# Patient Record
Sex: Female | Born: 1990 | Race: Black or African American | Hispanic: No | Marital: Single | Smoking: Never smoker
Health system: Southern US, Community
[De-identification: ages and names within clinical notes are randomized; demographics above are authoritative.]

## PROBLEM LIST (undated history)

## (undated) ENCOUNTER — Inpatient Hospital Stay (HOSPITAL_COMMUNITY): Payer: Self-pay

## (undated) DIAGNOSIS — A599 Trichomoniasis, unspecified: Secondary | ICD-10-CM

## (undated) DIAGNOSIS — B379 Candidiasis, unspecified: Secondary | ICD-10-CM

## (undated) DIAGNOSIS — A499 Bacterial infection, unspecified: Secondary | ICD-10-CM

## (undated) DIAGNOSIS — J45909 Unspecified asthma, uncomplicated: Secondary | ICD-10-CM

## (undated) DIAGNOSIS — R87629 Unspecified abnormal cytological findings in specimens from vagina: Secondary | ICD-10-CM

## (undated) DIAGNOSIS — A749 Chlamydial infection, unspecified: Secondary | ICD-10-CM

## (undated) DIAGNOSIS — F419 Anxiety disorder, unspecified: Secondary | ICD-10-CM

## (undated) DIAGNOSIS — I10 Essential (primary) hypertension: Secondary | ICD-10-CM

## (undated) DIAGNOSIS — Z87898 Personal history of other specified conditions: Secondary | ICD-10-CM

## (undated) DIAGNOSIS — N390 Urinary tract infection, site not specified: Secondary | ICD-10-CM

## (undated) DIAGNOSIS — O139 Gestational [pregnancy-induced] hypertension without significant proteinuria, unspecified trimester: Secondary | ICD-10-CM

## (undated) DIAGNOSIS — B977 Papillomavirus as the cause of diseases classified elsewhere: Secondary | ICD-10-CM

## (undated) DIAGNOSIS — Z8619 Personal history of other infectious and parasitic diseases: Secondary | ICD-10-CM

## (undated) DIAGNOSIS — Z8759 Personal history of other complications of pregnancy, childbirth and the puerperium: Secondary | ICD-10-CM

## (undated) DIAGNOSIS — F32A Depression, unspecified: Secondary | ICD-10-CM

## (undated) HISTORY — DX: Depression, unspecified: F32.A

## (undated) HISTORY — DX: Personal history of other complications of pregnancy, childbirth and the puerperium: Z87.59

## (undated) HISTORY — DX: Urinary tract infection, site not specified: N39.0

## (undated) HISTORY — DX: Candidiasis, unspecified: B37.9

## (undated) HISTORY — DX: Unspecified asthma, uncomplicated: J45.909

## (undated) HISTORY — DX: Trichomoniasis, unspecified: A59.9

## (undated) HISTORY — DX: Unspecified abnormal cytological findings in specimens from vagina: R87.629

## (undated) HISTORY — DX: Papillomavirus as the cause of diseases classified elsewhere: B97.7

## (undated) HISTORY — DX: Personal history of other specified conditions: Z87.898

## (undated) HISTORY — DX: Bacterial infection, unspecified: A49.9

## (undated) HISTORY — DX: Personal history of other infectious and parasitic diseases: Z86.19

## (undated) HISTORY — DX: Chlamydial infection, unspecified: A74.9

## (undated) HISTORY — DX: Essential (primary) hypertension: I10

## (undated) HISTORY — DX: Gestational (pregnancy-induced) hypertension without significant proteinuria, unspecified trimester: O13.9

## (undated) HISTORY — PX: DILATION AND CURETTAGE OF UTERUS: SHX78

## (undated) HISTORY — DX: Anxiety disorder, unspecified: F41.9

---
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
## 2010-01-13 ENCOUNTER — Inpatient Hospital Stay (HOSPITAL_COMMUNITY)
Admission: AD | Admit: 2010-01-13 | Discharge: 2010-01-13 | Payer: Self-pay | Source: Home / Self Care | Admitting: Obstetrics and Gynecology

## 2010-01-13 ENCOUNTER — Ambulatory Visit: Payer: Self-pay | Admitting: Physician Assistant

## 2010-01-20 ENCOUNTER — Inpatient Hospital Stay (HOSPITAL_COMMUNITY): Admission: AD | Admit: 2010-01-20 | Discharge: 2010-01-21 | Payer: Self-pay | Admitting: Obstetrics and Gynecology

## 2010-01-20 ENCOUNTER — Ambulatory Visit: Payer: Self-pay | Admitting: Physician Assistant

## 2010-01-25 ENCOUNTER — Ambulatory Visit (HOSPITAL_COMMUNITY): Admission: RE | Admit: 2010-01-25 | Discharge: 2010-01-25 | Payer: Self-pay | Admitting: Obstetrics and Gynecology

## 2010-07-30 NOTE — L&D Delivery Note (Signed)
Delivery Note  Pt was complete and had urge to push, moved to [facility] and pushed well, FHR decel to 70's with good recovery after ctx, tilted to R side and continued pushing well, FHR decels to 60's as infant was crowning. Overall reassuring.   At 3:49 PM a viable female was delivered via Vaginal, Spontaneous Delivery (Presentation: Right Occiput Anterior).  Cord was looped around shoulders,  APGAR: 8, 9; weight .  Unavailable at this time Placenta status: Intact, Spontaneous.  Sent to L&D, routine cord blood collected, Cord: 3 vessels with the following complications: None.    Anesthesia: Epidural  Episiotomy: None Lacerations: None Suture Repair: n/a Est. Blood Loss (mL):   Mom to postpartum.  Baby to nursery-stable. Skin-skin Mom plans BF and bottle and OP circ   Kristin Lyons M 07/28/2011, 4:07 PM

## 2010-10-15 LAB — URINE MICROSCOPIC-ADD ON

## 2010-10-15 LAB — URINALYSIS, ROUTINE W REFLEX MICROSCOPIC
Bilirubin Urine: NEGATIVE
Bilirubin Urine: NEGATIVE
Glucose, UA: NEGATIVE mg/dL
Glucose, UA: NEGATIVE mg/dL
Hgb urine dipstick: NEGATIVE
Ketones, ur: 15 mg/dL — AB
Ketones, ur: NEGATIVE mg/dL
Nitrite: NEGATIVE
Protein, ur: NEGATIVE mg/dL
Protein, ur: NEGATIVE mg/dL
Specific Gravity, Urine: 1.02 (ref 1.005–1.030)
Urobilinogen, UA: 0.2 mg/dL (ref 0.0–1.0)
pH: 6 (ref 5.0–8.0)
pH: 6.5 (ref 5.0–8.0)

## 2010-10-15 LAB — CBC
HCT: 39.2 % (ref 36.0–46.0)
Hemoglobin: 13.3 g/dL (ref 12.0–15.0)
MCHC: 34 g/dL (ref 30.0–36.0)
RBC: 4.32 MIL/uL (ref 3.87–5.11)
WBC: 5.6 10*3/uL (ref 4.0–10.5)

## 2010-10-15 LAB — WET PREP, GENITAL
Clue Cells Wet Prep HPF POC: NONE SEEN
Trich, Wet Prep: NONE SEEN
Yeast Wet Prep HPF POC: NONE SEEN

## 2010-10-15 LAB — GC/CHLAMYDIA PROBE AMP, GENITAL
Chlamydia, DNA Probe: NEGATIVE
GC Probe Amp, Genital: NEGATIVE

## 2010-10-15 LAB — POCT PREGNANCY, URINE: Preg Test, Ur: POSITIVE

## 2010-10-15 LAB — ABO/RH: ABO/RH(D): O POS

## 2011-06-08 LAB — STREP B DNA PROBE: GBS: POSITIVE

## 2011-06-14 ENCOUNTER — Inpatient Hospital Stay (HOSPITAL_COMMUNITY)
Admission: AD | Admit: 2011-06-14 | Discharge: 2011-06-16 | DRG: 778 | Disposition: A | Discharge status: Home / Self Care | Payer: Medicaid Other | Source: Ambulatory Visit | Attending: Obstetrics and Gynecology | Admitting: Obstetrics and Gynecology

## 2011-06-14 ENCOUNTER — Inpatient Hospital Stay (HOSPITAL_COMMUNITY): Payer: Medicaid Other

## 2011-06-14 ENCOUNTER — Encounter (HOSPITAL_COMMUNITY): Payer: Self-pay

## 2011-06-14 DIAGNOSIS — Z22330 Carrier of Group B streptococcus: Secondary | ICD-10-CM

## 2011-06-14 DIAGNOSIS — O99891 Other specified diseases and conditions complicating pregnancy: Secondary | ICD-10-CM | POA: Diagnosis present

## 2011-06-14 DIAGNOSIS — O99820 Streptococcus B carrier state complicating pregnancy: Secondary | ICD-10-CM

## 2011-06-14 DIAGNOSIS — O4700 False labor before 37 completed weeks of gestation, unspecified trimester: Principal | ICD-10-CM | POA: Diagnosis present

## 2011-06-14 LAB — COMPREHENSIVE METABOLIC PANEL
Albumin: 2.8 g/dL — ABNORMAL LOW (ref 3.5–5.2)
BUN: 4 mg/dL — ABNORMAL LOW (ref 6–23)
Calcium: 9.3 mg/dL (ref 8.4–10.5)
Chloride: 100 mEq/L (ref 96–112)
Creatinine, Ser: 0.46 mg/dL — ABNORMAL LOW (ref 0.50–1.10)
GFR calc non Af Amer: >90 mL/min (ref >90)
Total Bilirubin: 0.1 mg/dL — ABNORMAL LOW (ref 0.3–1.2)

## 2011-06-14 LAB — URIC ACID: Uric Acid, Serum: 4.4 mg/dL (ref 2.4–7.0)

## 2011-06-14 LAB — LACTATE DEHYDROGENASE: LDH: 133 U/L (ref 94–250)

## 2011-06-14 LAB — URINALYSIS, ROUTINE W REFLEX MICROSCOPIC
Bilirubin Urine: NEGATIVE
Glucose, UA: NEGATIVE mg/dL
Hgb urine dipstick: NEGATIVE
Specific Gravity, Urine: 1.01 (ref 1.005–1.030)
Urobilinogen, UA: 0.2 mg/dL (ref 0.0–1.0)
pH: 6.5 (ref 5.0–8.0)

## 2011-06-14 LAB — CBC
HCT: 32.9 % — ABNORMAL LOW (ref 36.0–46.0)
Hemoglobin: 11 g/dL — ABNORMAL LOW (ref 12.0–15.0)
MCV: 85.5 fL (ref 78.0–100.0)
RBC: 3.85 MIL/uL — ABNORMAL LOW (ref 3.87–5.11)
RDW: 13.3 % (ref 11.5–15.5)
WBC: 8.4 10*3/uL (ref 4.0–10.5)

## 2011-06-14 LAB — URINE MICROSCOPIC-ADD ON

## 2011-06-14 MED ORDER — ACETAMINOPHEN 325 MG PO TABS
650.0000 mg | ORAL_TABLET | ORAL | Status: DC | PRN
  Administered 2011-06-16: 650 mg via ORAL
  Filled 2011-06-14: qty 2

## 2011-06-14 MED ORDER — ZOLPIDEM TARTRATE 10 MG PO TABS: 10.0000 mg | ORAL_TABLET | Freq: Every evening | ORAL | Status: DC | PRN

## 2011-06-14 MED ORDER — BETAMETHASONE SOD PHOS & ACET 6 (3-3) MG/ML IJ SUSP
12.0000 mg | Freq: Once | INTRAMUSCULAR | Status: AC
  Administered 2011-06-14: 12 mg via INTRAMUSCULAR
  Filled 2011-06-14: qty 2

## 2011-06-14 MED ORDER — LACTATED RINGERS IV BOLUS (SEPSIS)
500.0000 mL | Freq: Once | INTRAVENOUS | Status: AC
  Administered 2011-06-14: 1000 mL via INTRAVENOUS

## 2011-06-14 MED ORDER — CALCIUM CARBONATE ANTACID 500 MG PO CHEW: 2.0000 | CHEWABLE_TABLET | ORAL | Status: DC | PRN

## 2011-06-14 MED ORDER — BETAMETHASONE SOD PHOS & ACET 6 (3-3) MG/ML IJ SUSP
12.0000 mg | Freq: Once | INTRAMUSCULAR | Status: AC
  Administered 2011-06-15: 12 mg via INTRAMUSCULAR
  Filled 2011-06-14: qty 2

## 2011-06-14 MED ORDER — LACTATED RINGERS IV SOLN
INTRAVENOUS | Status: DC
  Administered 2011-06-14 – 2011-06-16 (×4): via INTRAVENOUS

## 2011-06-14 MED ORDER — NIFEDIPINE 10 MG PO CAPS
10.0000 mg | ORAL_CAPSULE | Freq: Four times a day (QID) | ORAL | Status: DC
  Administered 2011-06-14 – 2011-06-16 (×7): 10 mg via ORAL
  Filled 2011-06-14 (×7): qty 1

## 2011-06-14 MED ORDER — PRENATAL PLUS 27-1 MG PO TABS
1.0000 | ORAL_TABLET | Freq: Every day | ORAL | Status: DC
  Filled 2011-06-14: qty 1

## 2011-06-14 NOTE — Progress Notes (Signed)
Pt states send from MD office for PTL eval, found to be having ctx's in office, cervix slightly dilated per MD. Pt denies pain or bleeding. +FM, has clear vaginal d/c.

## 2011-06-14 NOTE — H&P (Signed)
Kristin Lyons is a 20 y.o. female, G2P0010, at 31 weeks, presenting for admission due to cervix 2 cm and contractions q 13-15 minutes in MAU.    Pregnancy remarkable for: Late to care at 19 weeks Positive GBS on urine culture Hx 2nd trimester SAB at 15 weeks, with D&C Hx asthma  Maternal Medical History:  Reason for admission: Reason for admission: contractions.  Contractions: Frequency: irregular.   Perceived severity is mild.    Fetal activity: Perceived fetal activity is normal.   Last perceived fetal movement was within the past hour.    Prenatal complications: Preterm labor (Cervix 2 cm on exam today).   Prenatal Complications - Diabetes: none.   HPI:  Entered care on 8/9 at 19 weeks, with [place] sone for dating--EDC 08/02/11 by [place], anterior placenta, normal cervical length and anatomy.  Had missed previously scheduled NOB work-up on 7/2 due to insurance issues.  Presented for regular visit today, with c/o increased mucusy discharge.  VE done, showing cervix 2 cm, but no FFN or cultures done before exam.  OB History    Grav Para Term Preterm Abortions TAB SAB Ect Mult Living   2    1  1        #1--6/11.  15 week SAB, with D&C #2--Current  Past Medical History  Diagnosis Date  . Asthma    Past Surgical History  Procedure Date  . No past surgeries   Hx D&C 6/11 for 15 week SAB  Family History: family history is not on file.PU and MU hypertension;  Father drug and etoh use Social History:  reports that she has never smoked. She does not have any smokeless tobacco history on file. She reports that she does not drink alcohol or use illicit drugs.  Single, FOB involved but not currently present with her [facility]).. 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  FOB has some college, is currently unemployed.    Review of Systems  Constitutional: Negative.   HENT: Negative.   Eyes: Negative.   Respiratory: Negative.   Cardiovascular:  Negative.   Gastrointestinal: Negative.   Genitourinary: Negative.   Musculoskeletal: Negative.   Skin: Negative.   Neurological: Negative.   Psychiatric/Behavioral: Negative.       Blood pressure 139/66, pulse 85, temperature 98.6 F (37 C), temperature source Oral, resp. rate 18, height 5\' 2"  (1.575 m), weight 73.029 kg (161 lb). Maternal Exam:  Uterine Assessment: Contraction strength is mild.  Contraction frequency is irregular.   Abdomen: Patient reports no abdominal tenderness. Fundal height is 33 cm.   Fetal presentation: vertex  Introitus: Normal vulva. Normal vagina.  Ferning test: not done.  Nitrazine test: not done. Amniotic fluid character: not assessed.  Cervix: not evaluated.   Physical Exam  Constitutional: She is oriented to person, place, and time. She appears well-developed.  HENT:  Head: Normocephalic.  Eyes: Pupils are equal, round, and reactive to light.  Neck: Normal range of motion.  Cardiovascular: Normal rate and regular rhythm.   Respiratory: Effort normal and breath sounds normal.  GI: Soft. Bowel sounds are normal.  Genitourinary: Uterus normal.  Musculoskeletal: Normal range of motion.  Neurological: She is alert and oriented to person, place, and time.  Skin: Skin is warm and dry.  Psychiatric: She has a normal mood and affect. Her behavior is normal. Judgment and thought content normal.   Cervix was 2 cm at office visit (approx 4pm).  No FFN or cultures done  at that time. UCs q 13-15 minutes, mild--patient now aware that what she has been feeling are contractions (was not aware before evaluation today.) FHR reactive, no decels.  Prenatal labs: ABO, Rh:  O+ Antibody:  Neg Rubella:  Immune RPR:   NR HBsAg:   Neg HIV:  Neg  GBS:   Positive urine culture Quad screen WNL Glucola WNL Hgb12.3 at NOB  Assessment/Plan: IUP at 33 weeks Preterm labor, with cervical dilation Positive GBS Late to care Hx asthma  Plan: Admitted to  Antenatal per consult with Dr. Stefano Gaul Routine antenatal orders Obtain FFN, GBS, GC, Chlamydia, wet prep at 4 pm tomorrow Continuous EFM. Betamethasone x 2 doses Procardia 10 mg po q 6h CBC, CMP UA, with culture OB US for growth ([place] will do tonight Plan of care reviewed with patient--she is agreeable with plan.  Nigel Bridgeman 06/14/2011, 8:03 PM

## 2011-06-15 LAB — RPR: RPR: NONREACTIVE

## 2011-06-15 LAB — WET PREP, GENITAL
Clue Cells Wet Prep HPF POC: NONE SEEN
Trich, Wet Prep: NONE SEEN
Yeast Wet Prep HPF POC: NONE SEEN

## 2011-06-15 LAB — ANTIBODY SCREEN: Antibody Screen: NEGATIVE

## 2011-06-15 NOTE — Progress Notes (Signed)
Pt without complaints.  No leakage of fluid or VB.  Good FM  BP 120/65  Pulse 95  Temp(Src) 98.3 F (36.8 C) (Oral)  Resp 20  Ht 5\' 2"  (1.575 m)  Wt 73.029 kg (161 lb)  BMI 29.45 kg/m2  FHTS Baseline: 140-150 bpm,mod varibility and accels  Toco irregular, every 30 minutes  Pt in NAD CV RRR Lungs CTAB abd  Gravid soft and NT GU no vb EXt no calf tenderness Results for orders placed during the hospital encounter of 06/14/11 (from the past 72 hour(s))  HIV ANTIBODY (ROUTINE TESTING)     Status: Normal      Component Value Range Comment   HIV Non-reactive     RUBELLA ANTIBODY, IGM     Status: Normal      Component Value Range Comment   Rubella Immune     HEPATITIS B SURFACE ANTIGEN     Status: Normal      Component Value Range Comment   Hepatitis B Surface Ag Negative     RPR     Status: Normal      Component Value Range Comment   RPR Nonreactive     URINALYSIS, ROUTINE W REFLEX MICROSCOPIC     Status: Abnormal   Collection Time   06/14/11  6:29 PM      Component Value Range Comment   Color, Urine YELLOW  YELLOW     Appearance CLEAR  CLEAR     Specific Gravity, Urine 1.010  1.005 - 1.030     pH 6.5  5.0 - 8.0     Glucose, UA NEGATIVE  NEGATIVE (mg/dL)    Hgb urine dipstick NEGATIVE  NEGATIVE     Bilirubin Urine NEGATIVE  NEGATIVE     Ketones, ur 15 (*) NEGATIVE (mg/dL)    Protein, ur NEGATIVE  NEGATIVE (mg/dL)    Urobilinogen, UA 0.2  0.0 - 1.0 (mg/dL)    Nitrite NEGATIVE  NEGATIVE     Leukocytes, UA MODERATE (*) NEGATIVE    URINE MICROSCOPIC-ADD ON     Status: Abnormal   Collection Time   06/14/11  6:29 PM      Component Value Range Comment   Squamous Epithelial / LPF FEW (*) RARE     WBC, UA 7-10  <3 (WBC/hpf)    Bacteria, UA FEW (*) RARE     Urine-Other MUCOUS PRESENT     CBC     Status: Abnormal   Collection Time   06/14/11  8:05 PM      Component Value Range Comment   WBC 8.4  4.0 - 10.5 (K/uL)    RBC 3.85 (*) 3.87 - 5.11 (MIL/uL)    Hemoglobin 11.0  (*) 12.0 - 15.0 (g/dL)    HCT 21.3 (*) 08.6 - 46.0 (%)    MCV 85.5  78.0 - 100.0 (fL)    MCH 28.6  26.0 - 34.0 (pg)    MCHC 33.4  30.0 - 36.0 (g/dL)    RDW 57.8  46.9 - 62.9 (%)    Platelets 173  150 - 400 (K/uL)   COMPREHENSIVE METABOLIC PANEL     Status: Abnormal   Collection Time   06/14/11  8:05 PM      Component Value Range Comment   Sodium 134 (*) 135 - 145 (mEq/L)    Potassium 3.4 (*) 3.5 - 5.1 (mEq/L)    Chloride 100  96 - 112 (mEq/L)    CO2 23  19 - 32 (mEq/L)  Glucose, Bld 84  70 - 99 (mg/dL)    BUN 4 (*) 6 - 23 (mg/dL)    Creatinine, Ser 1.61 (*) 0.50 - 1.10 (mg/dL)    Calcium 9.3  8.4 - 10.5 (mg/dL)    Total Protein 6.0  6.0 - 8.3 (g/dL)    Albumin 2.8 (*) 3.5 - 5.2 (g/dL)    AST 16  0 - 37 (U/L)    ALT 11  0 - 35 (U/L)    Alkaline Phosphatase 164 (*) 39 - 117 (U/L)    Total Bilirubin 0.1 (*) 0.3 - 1.2 (mg/dL)    GFR calc non Af Amer >90  >90 (mL/min)    GFR calc Af Amer >90  >90 (mL/min)   LACTATE DEHYDROGENASE     Status: Normal   Collection Time   06/14/11  8:05 PM      Component Value Range Comment   LD 133  94 - 250 (U/L)   URIC ACID     Status: Normal   Collection Time   06/14/11  8:05 PM      Component Value Range Comment   Uric Acid, Serum 4.4  2.4 - 7.0 (mg/dL)     Assessment and Plan [redacted]w[redacted]d  ptl Wet prep ,gc and chlam Beta methasone Procardia prn  Monitor closely

## 2011-06-15 NOTE — Progress Notes (Signed)
UR Chart review completed.  

## 2011-06-16 LAB — URINE CULTURE
Culture  Setup Time: 201211161015
Special Requests: NORMAL

## 2011-06-16 LAB — GC/CHLAMYDIA PROBE AMP, URINE
Chlamydia, Swab/Urine, PCR: NEGATIVE
GC Probe Amp, Urine: NEGATIVE

## 2011-06-16 MED ORDER — NIFEDIPINE 10 MG PO CAPS: 10.0000 mg | ORAL_CAPSULE | Freq: Four times a day (QID) | ORAL | Status: DC | PRN

## 2011-06-16 NOTE — Discharge Summary (Signed)
  Obstetric Discharge Summary Reason for Admission: preterm labor Prenatal Procedures: NST and ultrasound Intrapartum Procedures: na Postpartum Procedures: na Complications-Operative and Postpartum: none  Temp:  [97.8 F (36.6 C)-98.4 F (36.9 C)] 97.8 F (36.6 C) (11/17 0800) Pulse Rate:  [90-115] 90  (11/17 0800) Resp:  [18-20] 18  (11/17 0800) BP: (105-133)/(43-74) 105/48 mmHg (11/17 0800) Hemoglobin  Date Value Range Status  06/14/2011 11.0* 12.0-15.0 (g/dL) Final     HCT  Date Value Range Status  06/14/2011 32.9* 36.0-46.0 (%) Final    Hospital Course:  Patient was admitted with PTL.  She was given betamethsone and procardia.  Her contractions have stopped with procardia.  She will be sent home on bedrest.  Discharge Diagnoses: False labor-undelivered  Discharge Information: Date: 06/16/2011 Activity: pelvic rest Diet: routine Medications:  Medication List  As of 06/16/2011 10:59 AM   ASK your doctor about these medications         prenatal vitamin w/FE, FA 27-1 MG Tabs           Condition: stable Instructions: refer to practice specific booklet Discharge to: home   Newborn Data: Live born This patient has no babies on file.; APGAR , ; weight ;  Home with infant still in utero.Marland Kitchen  Aviraj Kentner A 06/16/2011, 10:59 AM

## 2011-06-16 NOTE — Discharge Summary (Signed)
ADULT NUTRITION ASSESSMENT Date: 06/16/2011   Time: 10:55 AM PRETERM LABOR: Includes any of the following symptoms that occur between 20-[redacted] weeks gestation. If these symptoms are not stopped, preterm labor can result in preterm delivery, placing your baby at risk.  Notify your doctor if any of the following occur: 1. Menstrual-like cramps   5. Pelvic pressure  2. Uterine contractions. These may be painless and feel like the uterus is tightening or the baby is "balling up" 6. Increase or change in vaginal discharge  3. Low, dull backache, unrelieved by heat or Tylenol  7. Vaginal bleeding  4. Intestinal cramps, with our without diarrhea, sometimes 8. A general feeling that "something is not right"   9. Leaking of fluid described as "gas pain"    A. MEDICATION PRESCRIBED FOR HOME:  Medicine to stop labor (tocolytic)  Fill all the prescriptions ordered by your doctor and be sure to take the   entire dose as directed. Any time you go for emergency treatment, bring   your medicine. B. WOUND CARE: na C. DIET AT HOME: regular D. ACTIVITY: Bathroom/shower only E. SEXUAL ACTIVITY: Do not have sex or do anything that might make you have an orgasm F. FOLLOW-UP CARE:  Return to: Private Physician  Date: 0 Time: 0 call the office   Referral to Home Health Agency: N/A  Phone:   Note: If the agency has not contacted you within one day, you should call them. G. DISCHARGE TEACHING: Pt verbalized understanding of instructions H. MATERNAL DISCHARGE TO: Home

## 2011-07-28 ENCOUNTER — Inpatient Hospital Stay (HOSPITAL_COMMUNITY): Payer: Medicaid Other | Admitting: Anesthesiology

## 2011-07-28 ENCOUNTER — Inpatient Hospital Stay (HOSPITAL_COMMUNITY)
Admission: AD | Admit: 2011-07-28 | Discharge: 2011-07-30 | DRG: 775 | Disposition: A | Discharge status: Home / Self Care | Payer: Medicaid Other | Source: Ambulatory Visit | Attending: Obstetrics and Gynecology | Admitting: Obstetrics and Gynecology

## 2011-07-28 ENCOUNTER — Encounter (HOSPITAL_COMMUNITY): Payer: Self-pay | Admitting: Obstetrics and Gynecology

## 2011-07-28 ENCOUNTER — Encounter (HOSPITAL_COMMUNITY): Payer: Self-pay | Admitting: Anesthesiology

## 2011-07-28 DIAGNOSIS — Z3490 Encounter for supervision of normal pregnancy, unspecified, unspecified trimester: Secondary | ICD-10-CM

## 2011-07-28 DIAGNOSIS — O0930 Supervision of pregnancy with insufficient antenatal care, unspecified trimester: Secondary | ICD-10-CM

## 2011-07-28 DIAGNOSIS — Z22330 Carrier of Group B streptococcus: Secondary | ICD-10-CM

## 2011-07-28 DIAGNOSIS — O99892 Other specified diseases and conditions complicating childbirth: Principal | ICD-10-CM | POA: Diagnosis present

## 2011-07-28 LAB — CBC
HCT: 33.4 % — ABNORMAL LOW (ref 36.0–46.0)
Hemoglobin: 11.1 g/dL — ABNORMAL LOW (ref 12.0–15.0)
MCH: 28 pg (ref 26.0–34.0)
MCV: 84.3 fL (ref 78.0–100.0)
RBC: 3.96 MIL/uL (ref 3.87–5.11)
WBC: 8.6 10*3/uL (ref 4.0–10.5)

## 2011-07-28 MED ORDER — ONDANSETRON HCL 4 MG/2ML IJ SOLN: 4.0000 mg | INTRAMUSCULAR | Status: DC | PRN

## 2011-07-28 MED ORDER — BENZOCAINE-MENTHOL 20-0.5 % EX AERO: 1.0000 "application " | INHALATION_SPRAY | CUTANEOUS | Status: DC | PRN

## 2011-07-28 MED ORDER — PENICILLIN G POTASSIUM 5000000 UNITS IJ SOLR
2.5000 10*6.[IU] | INTRAVENOUS | Status: DC
  Administered 2011-07-28 (×2): 2.5 10*6.[IU] via INTRAVENOUS
  Filled 2011-07-28 (×5): qty 2.5

## 2011-07-28 MED ORDER — PHENYLEPHRINE 40 MCG/ML (10ML) SYRINGE FOR IV PUSH (FOR BLOOD PRESSURE SUPPORT): 80.0000 ug | PREFILLED_SYRINGE | INTRAVENOUS | Status: DC | PRN

## 2011-07-28 MED ORDER — TERBUTALINE SULFATE 1 MG/ML IJ SOLN: 0.2500 mg | Freq: Once | INTRAMUSCULAR | Status: DC | PRN

## 2011-07-28 MED ORDER — BUTORPHANOL TARTRATE 2 MG/ML IJ SOLN
2.0000 mg | INTRAMUSCULAR | Status: DC | PRN
  Administered 2011-07-28: 2 mg via INTRAVENOUS
  Filled 2011-07-28: qty 1

## 2011-07-28 MED ORDER — DIPHENHYDRAMINE HCL 25 MG PO CAPS: 25.0000 mg | ORAL_CAPSULE | Freq: Four times a day (QID) | ORAL | Status: DC | PRN

## 2011-07-28 MED ORDER — PENICILLIN G POTASSIUM 5000000 UNITS IJ SOLR: 5.0000 10*6.[IU] | Freq: Once | INTRAVENOUS | Status: DC

## 2011-07-28 MED ORDER — EPHEDRINE 5 MG/ML INJ
10.0000 mg | INTRAVENOUS | Status: DC | PRN
  Filled 2011-07-28: qty 4

## 2011-07-28 MED ORDER — FENTANYL 2.5 MCG/ML BUPIVACAINE 1/10 % EPIDURAL INFUSION (WH - ANES)
INTRAMUSCULAR | Status: DC | PRN
  Administered 2011-07-28: 14 mL/h via EPIDURAL

## 2011-07-28 MED ORDER — IBUPROFEN 600 MG PO TABS
600.0000 mg | ORAL_TABLET | Freq: Four times a day (QID) | ORAL | Status: DC
  Administered 2011-07-28 – 2011-07-30 (×5): 600 mg via ORAL
  Filled 2011-07-28 (×7): qty 1

## 2011-07-28 MED ORDER — TETANUS-DIPHTH-ACELL PERTUSSIS 5-2.5-18.5 LF-MCG/0.5 IM SUSP: 0.5000 mL | Freq: Once | INTRAMUSCULAR | Status: DC

## 2011-07-28 MED ORDER — WITCH HAZEL-GLYCERIN EX PADS: 1.0000 "application " | MEDICATED_PAD | CUTANEOUS | Status: DC | PRN

## 2011-07-28 MED ORDER — SENNOSIDES-DOCUSATE SODIUM 8.6-50 MG PO TABS
2.0000 | ORAL_TABLET | Freq: Every day | ORAL | Status: DC
  Administered 2011-07-28 – 2011-07-29 (×2): 2 via ORAL

## 2011-07-28 MED ORDER — DIBUCAINE 1 % RE OINT: 1.0000 "application " | TOPICAL_OINTMENT | RECTAL | Status: DC | PRN

## 2011-07-28 MED ORDER — OXYTOCIN 20 UNITS IN LACTATED RINGERS INFUSION - SIMPLE
1.0000 m[IU]/min | INTRAVENOUS | Status: DC
  Administered 2011-07-28: 500 m[IU]/min via INTRAVENOUS
  Filled 2011-07-28: qty 1000

## 2011-07-28 MED ORDER — LANOLIN HYDROUS EX OINT: TOPICAL_OINTMENT | CUTANEOUS | Status: DC | PRN

## 2011-07-28 MED ORDER — ONDANSETRON HCL 4 MG/2ML IJ SOLN
4.0000 mg | Freq: Four times a day (QID) | INTRAMUSCULAR | Status: DC | PRN
  Administered 2011-07-28: 4 mg via INTRAVENOUS
  Filled 2011-07-28: qty 2

## 2011-07-28 MED ORDER — PENICILLIN G POTASSIUM 5000000 UNITS IJ SOLR: 2.5000 10*6.[IU] | INTRAVENOUS | Status: DC

## 2011-07-28 MED ORDER — SIMETHICONE 80 MG PO CHEW: 80.0000 mg | CHEWABLE_TABLET | ORAL | Status: DC | PRN

## 2011-07-28 MED ORDER — EPHEDRINE 5 MG/ML INJ: 10.0000 mg | INTRAVENOUS | Status: DC | PRN

## 2011-07-28 MED ORDER — LACTATED RINGERS IV SOLN
500.0000 mL | Freq: Once | INTRAVENOUS | Status: AC
  Administered 2011-07-28: 500 mL via INTRAVENOUS

## 2011-07-28 MED ORDER — SODIUM BICARBONATE 8.4 % IV SOLN
INTRAVENOUS | Status: DC | PRN
  Administered 2011-07-28: 4 mL via EPIDURAL

## 2011-07-28 MED ORDER — DIPHENHYDRAMINE HCL 50 MG/ML IJ SOLN: 12.5000 mg | INTRAMUSCULAR | Status: DC | PRN

## 2011-07-28 MED ORDER — OXYCODONE-ACETAMINOPHEN 5-325 MG PO TABS
1.0000 | ORAL_TABLET | ORAL | Status: DC | PRN
  Administered 2011-07-29 – 2011-07-30 (×2): 1 via ORAL
  Filled 2011-07-28 (×2): qty 1

## 2011-07-28 MED ORDER — ONDANSETRON HCL 4 MG PO TABS: 4.0000 mg | ORAL_TABLET | ORAL | Status: DC | PRN

## 2011-07-28 MED ORDER — PRENATAL MULTIVITAMIN CH
1.0000 | ORAL_TABLET | Freq: Every day | ORAL | Status: DC
  Administered 2011-07-30: 1 via ORAL
  Filled 2011-07-28 (×2): qty 1

## 2011-07-28 MED ORDER — PENICILLIN G POTASSIUM 5000000 UNITS IJ SOLR
5.0000 10*6.[IU] | Freq: Once | INTRAVENOUS | Status: AC
  Administered 2011-07-28: 5 10*6.[IU] via INTRAVENOUS
  Filled 2011-07-28: qty 5

## 2011-07-28 MED ORDER — LACTATED RINGERS IV SOLN
INTRAVENOUS | Status: DC
  Administered 2011-07-28: 999 mL/h via INTRAVENOUS
  Administered 2011-07-28: 125 mL/h via INTRAVENOUS
  Administered 2011-07-28 (×2): via INTRAVENOUS

## 2011-07-28 MED ORDER — MEASLES, MUMPS & RUBELLA VAC ~~LOC~~ INJ
0.5000 mL | INJECTION | Freq: Once | SUBCUTANEOUS | Status: DC
  Filled 2011-07-28: qty 0.5

## 2011-07-28 MED ORDER — PHENYLEPHRINE 40 MCG/ML (10ML) SYRINGE FOR IV PUSH (FOR BLOOD PRESSURE SUPPORT)
80.0000 ug | PREFILLED_SYRINGE | INTRAVENOUS | Status: DC | PRN
  Filled 2011-07-28: qty 5

## 2011-07-28 MED ORDER — ZOLPIDEM TARTRATE 5 MG PO TABS: 5.0000 mg | ORAL_TABLET | Freq: Every evening | ORAL | Status: DC | PRN

## 2011-07-28 MED ORDER — FENTANYL 2.5 MCG/ML BUPIVACAINE 1/10 % EPIDURAL INFUSION (WH - ANES)
14.0000 mL/h | INTRAMUSCULAR | Status: DC
  Administered 2011-07-28: 14 mL/h via EPIDURAL
  Filled 2011-07-28 (×2): qty 60

## 2011-07-28 NOTE — Progress Notes (Signed)
Kristin Lyons is a 20 y.o. G2P0010 at [redacted]w[redacted]d    Subjective: IV med helped, I want epidural before ARM, pain level 7   Objective: BP 134/76  Pulse 84  Temp(Src) 98.2 F (36.8 C) (Oral)  Resp 18  Ht 5\' 3"  (1.6 m)  Wt 170 lb 9.6 oz (77.384 kg)  BMI 30.22 kg/m2  Breastfeeding? Unknown      FHT:  130 LTV mod UC:   2-4 40-90" duration, mild to mod SVE:   Dilation: 5 Effacement (%): 100 Station: -1 Exam by:: Sanda Klein, CNM  Labs: Lab Results  Component Value Date   WBC 8.6 07/28/2011   HGB 11.1* 07/28/2011   HCT 33.4* 07/28/2011   MCV 84.3 07/28/2011   PLT 189 07/28/2011    Assessment / Plan: 39 2/7 week IUP labor P epidural then vag and arm discussed.    Iker Nuttall 07/28/2011, 10:00 AM

## 2011-07-28 NOTE — Progress Notes (Signed)
BP and pulse noted--not believed to be accurate--pt has arm bend holding infant-will reassess

## 2011-07-28 NOTE — Progress Notes (Signed)
Denies need for epidural now, agrees to arm and IV Pitocin if indicated O VSS     Fhts 130s LTV mod accels     uc q 2-5 mild to mod     Vag 5 100 -1/0 VTX arm mod amount clear fluid A not in active labor P IV pitocin if indicated, epidural when desires. Lavera Guise, CNM

## 2011-07-28 NOTE — Progress Notes (Signed)
Subjective: Comfortable, a little pressure feeling Objective: BP 105/69  Pulse 73  Temp(Src) 97.5 F (36.4 C) (Oral)  Resp 18  Ht 5\' 3"  (1.6 m)  Wt 170 lb (77.111 kg)  BMI 30.11 kg/m2  SpO2 96%  Breastfeeding? Unknown        FHT:  FHR: 115 bpm, variability: moderate,  accelerations:  Abscent,  decelerations:  Absent UC:   regular, every 2-3 minutes SVE:   Dilation: Lip/rim Effacement (%): 100 Station: +1 Exam by:: S.Earl, RN  Labs: Lab Results  Component Value Date   WBC 8.6 07/28/2011   HGB 11.1* 07/28/2011   HCT 33.4* 07/28/2011   MCV 84.3 07/28/2011   PLT 189 07/28/2011    Assessment and Plan:  does not have a problem list on file. Term pg Active labor GBS+ P labor down, report to Gevena Barre, CNM at [address], CNM  [facility], [facility] 07/28/2011, 2:45 PM

## 2011-07-28 NOTE — Anesthesia Procedure Notes (Signed)

## 2011-07-28 NOTE — Progress Notes (Signed)
"  I've been cramping since midnight.  I haven't the baby move much since about 0200, but I can feel him moving now."

## 2011-07-28 NOTE — Progress Notes (Signed)
Not accurate BP reading--pt moving during reading--will reassess

## 2011-07-28 NOTE — H&P (Signed)
Kristin Lyons is a 20 y.o. female presenting for onset of ctx since about midnight, have been getting closer and stronger now. Denies VB, LOF +FM. C/O of cough, but no fever/aches/chills.  Pregnancy significant for: 1. Late to care at 19 weeks  2. Positive GBS on urine culture  3. Hx 2nd trimester SAB at 15 weeks, with D&C  4. Hx asthma - stable 5. Hx PTL - tx'd with procardia, given BMZ at 33wks   HPI: Pt began [facility] at 19wks pt's nxt visit was at [facility] for back and given ABX for presumed UTI. A quad screen was done and was normal. The next visit was then at 25wks, and subsequent visit at 28wks with 1hr gtt, that was normal. At 33wks, she was 2cm dilated and admitted to [facility] and given a course of BMZ and started on procardia, then d/c home on bedrest. Rest of prenatal course was routine.   Maternal Medical History:  Reason for admission: Reason for admission: contractions.  Contractions: Onset was 6-12 hours ago.   Frequency: regular.   Perceived severity is moderate.    Fetal activity: Perceived fetal activity is normal.   Last perceived fetal movement was within the past hour.    Prenatal complications: no prenatal complications   OB History    Grav Para Term Preterm Abortions TAB SAB Ect Mult Living   2 0 0 0 1 0 1 0 0 0      Past Medical History  Diagnosis Date  . Asthma    Past Surgical History  Procedure Date  . No past surgeries    Family History: family history is not on file. Social History:  reports that she has never smoked. She has never used smokeless tobacco. She reports that she does not drink alcohol or use illicit drugs.  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   Review of Systems  Respiratory: Positive for cough.   All other systems reviewed and are negative.    Dilation: 4.5 Effacement (%): 100 Station: -1 Exam by:: Sanda Klein, CNM Blood pressure 134/76, pulse 84, temperature 98.2 F (36.8 C), temperature source Oral,  resp. rate 18, height 5\' 3"  (1.6 m), weight 77.384 kg (170 lb 9.6 oz), unknown if currently breastfeeding. Maternal Exam:  Uterine Assessment: Contraction strength is moderate.  Contraction duration is 60 seconds. Contraction frequency is regular.   Abdomen: Patient reports no abdominal tenderness. Fundal height is aga.   Estimated fetal weight is 7-4.   Fetal presentation: vertex  Introitus: Normal vulva. Normal vagina.  Pelvis: adequate for delivery.   Cervix: Cervix evaluated by digital exam.     Fetal Exam Fetal Monitor Review: Mode: ultrasound.   Baseline rate: 150.  Variability: moderate (6-25 bpm).   Pattern: accelerations present and no decelerations.    Fetal State Assessment: Category I - tracings are normal.     Physical Exam  Nursing note and vitals reviewed. Constitutional: She is oriented to person, place, and time. She appears well-developed and well-nourished.  Cardiovascular: Normal rate and normal heart sounds.   Respiratory: Effort normal and breath sounds normal.  GI: Soft.  Genitourinary: Vagina normal.  Musculoskeletal: Normal range of motion. She exhibits no edema.  Neurological: She is alert and oriented to person, place, and time.  Skin: Skin is warm.  Psychiatric: She has a normal mood and affect. Her behavior is normal. Judgment and thought content normal.    Prenatal labs: ABO, Rh:  O pos Antibody:  neg Rubella: Immune (11/16 0000) RPR: Nonreactive (11/16 0000)  HBsAg: Negative (11/16 0000)  HIV: Non-reactive (11/16 0000)  GBS: Positive (11/09 0000)  Quad normal 1hr gtt normal  Assessment/Plan: IUP at [redacted]w[redacted]d Early labor GBS pos FHR reassuring  Admit to birthing suites - Dr Su Hilt attending, CNM care Routine CNM Orders PCN for GBS prophylaxis Analgesia/anasthesia PRN   Rajah Tagliaferro M 07/28/2011, 5:07 AM

## 2011-07-28 NOTE — Anesthesia Preprocedure Evaluation (Addendum)
Anesthesia Evaluation  Patient identified by MRN, date of birth, ID band Patient awake    Reviewed: Allergy & Precautions, H&P , Patient's Chart, lab work & pertinent test results  Airway Mallampati: II TM Distance: >3 FB Neck ROM: full    Dental  (+) Teeth Intact   Pulmonary asthma (no inhaler, not active) ,  clear to auscultation        Cardiovascular regular Normal    Neuro/Psych    GI/Hepatic   Endo/Other    Renal/GU      Musculoskeletal   Abdominal   Peds  Hematology   Anesthesia Other Findings       Reproductive/Obstetrics (+) Pregnancy                          Anesthesia Physical Anesthesia Plan  ASA: II  Anesthesia Plan: Epidural   Post-op Pain Management:    Induction:   Airway Management Planned:   Additional Equipment:   Intra-op Plan:   Post-operative Plan:   Informed Consent: I have reviewed the patients History and Physical, chart, labs and discussed the procedure including the risks, benefits and alternatives for the proposed anesthesia with the patient or authorized representative who has indicated his/her understanding and acceptance.   Dental Advisory Given  Plan Discussed with:   Anesthesia Plan Comments: (Labs checked- platelets confirmed with RN in room. Fetal heart tracing, per RN, reported to be stable enough for sitting procedure. Discussed epidural, and patient consents to the procedure:  included risk of possible headache,backache, failed block, allergic reaction, and nerve injury. This patient was asked if she had any questions or concerns before the procedure started. )        Anesthesia Quick Evaluation

## 2011-07-29 LAB — CBC
HCT: 32.5 % — ABNORMAL LOW (ref 36.0–46.0)
MCHC: 32.6 g/dL (ref 30.0–36.0)
RDW: 14.4 % (ref 11.5–15.5)

## 2011-07-29 MED ORDER — GUAIFENESIN ER 600 MG PO TB12
1200.0000 mg | ORAL_TABLET | Freq: Two times a day (BID) | ORAL | Status: DC
  Administered 2011-07-29 – 2011-07-30 (×2): 1200 mg via ORAL
  Filled 2011-07-29 (×5): qty 2

## 2011-07-29 NOTE — Anesthesia Postprocedure Evaluation (Signed)
  Anesthesia Post-op Note  Patient: Kristin Lyons  Procedure(s) Performed: * No procedures listed *  Patient Location: Mother/Baby  Anesthesia Type: Epidural  Level of Consciousness: awake, alert  and oriented  Airway and Oxygen Therapy: Patient Spontanous Breathing  Post-op Pain: mild  Post-op Assessment: Patient's Cardiovascular Status Stable, Respiratory Function Stable, Patent Airway, No signs of Nausea or vomiting and Pain level controlled  Post-op Vital Signs: stable  Complications: No apparent anesthesia complications

## 2011-07-29 NOTE — Progress Notes (Addendum)
Post Partum Day 1 Subjective: no complaints, up ad lib, voiding, tolerating PO, + flatus and eating breakfast currently.  Working on latch, but needs assistance.  Tried to use hand pump yesterday and difficult.  Upper resp c/o's persist, but energy better.  Desires mucinex.  Planning outpatient circ. Newborn in [place].  No visitors at bedside. Objective: Blood pressure 103/70, pulse 88, temperature 98.4 F (36.9 C), temperature source Oral, resp. rate 18, height 5\' 3"  (1.6 m), weight 77.111 kg (170 lb), SpO2 99.00%, unknown if currently breastfeeding.  Physical Exam:  General: alert, cooperative and no distress Lochia: appropriate Uterine Fundus: firm, below umbilicus Incision: n/a DVT Evaluation: No evidence of DVT seen on physical exam. Negative Homan's sign. No significant calf/ankle edema.   Basename 07/29/11 0530 07/28/11 0520  HGB 10.6* 11.1*  HCT 32.5* 33.4*    Assessment/Plan: Plan for discharge tomorrow, Breastfeeding and Lactation consult; Upper respiratory infection--suspect viral:  Ordered Mucinex bid.   LOS: 1 day   STEELMAN,CANDICE H 07/29/2011, 11:04 AM    Agree with above - AYR

## 2011-07-30 MED ORDER — IBUPROFEN 600 MG PO TABS: 600.0000 mg | ORAL_TABLET | Freq: Four times a day (QID) | ORAL | Status: AC

## 2011-07-30 MED ORDER — MEDROXYPROGESTERONE ACETATE 150 MG/ML IM SUSP
150.0000 mg | Freq: Once | INTRAMUSCULAR | Status: AC
  Administered 2011-07-30: 150 mg via INTRAMUSCULAR
  Filled 2011-07-30: qty 1

## 2011-07-30 NOTE — Progress Notes (Addendum)
Patient ID: Kristin Lyons, female   DOB: July 02, 1991, 20 y.o.   MRN: [id number] Post Partum Day 2 Subjective: no complaints, up ad lib without syncope, voiding, tolerating PO, + flatus  Pain well controlled with po meds BF well Mood stable, bonding well   Objective: Blood pressure 107/63, pulse 80, temperature 97.8 F (36.6 C), temperature source Oral, resp. rate 18, height 5\' 3"  (1.6 m), weight 77.111 kg (170 lb), SpO2 99.00%, unknown if currently breastfeeding.  Physical Exam:  General: alert and no distress Lungs: CTAB Heart: RRR Breasts: WNL Lochia: appropriate Uterine Fundus: firm Perineum: WNL DVT Evaluation: No evidence of DVT seen on physical exam. Negative Homan's sign. No significant calf/ankle edema.   Basename 07/29/11 0530 07/28/11 0520  HGB 10.6* 11.1*  HCT 32.5* 33.4*    Assessment/Plan: D/C home Stable PP BF Contraception - depo-provera      LOS: 2 days   Johnathen Testa M 07/30/2011, 8:45 AM

## 2011-07-30 NOTE — Progress Notes (Signed)
UR chart review completed.  

## 2011-07-30 NOTE — Discharge Summary (Signed)
   Obstetric Discharge Summary Reason for Admission: onset of labor Prenatal Procedures: ultrasound Intrapartum Procedures: spontaneous vaginal delivery, GBS prophylaxis and epidural Postpartum Procedures: none Complications-Operative and Postpartum: none  Temp:  [97.8 F (36.6 C)] 97.8 F (36.6 C) (12/31 0615) Pulse Rate:  [80] 80  (12/31 0615) Resp:  [18] 18  (12/31 0615) BP: (107)/(63) 107/63 mmHg (12/31 0615) Hemoglobin  Date Value Range Status  07/29/2011 10.6* 12.0-15.0 (g/dL) Final     HCT  Date Value Range Status  07/29/2011 32.5* 36.0-46.0 (%) Final    Hospital Course:  Hospital Course: Admitted in labor at 4cm. pos GBS. Progressed to fully dilated, . Delivery was performed by KristinRafeal Lyons, CNM without difficulty. Patient and baby tolerated the procedure without difficulty, with no laceration noted. Infant to FTN. Mother and infant then had an uncomplicated postpartum course, with breast feeding going well. Mom's physical exam was WNL, and she was discharged home in stable condition. Contraception plan was depo-provera.  She received adequate benefit from po pain medications.  Discharge Diagnoses: Term Pregnancy-delivered  Discharge Information: Date: 07/30/2011 Activity: pelvic rest Diet: routine Medications:  Medication List  As of 07/30/2011 10:34 PM   START taking these medications         ibuprofen 600 MG tablet   Commonly known as: ADVIL,MOTRIN   Take 1 tablet (600 mg total) by mouth every 6 (six) hours.         CONTINUE taking these medications         prenatal multivitamin Tabs          Where to get your medications    These are the prescriptions that you need to pick up.   You may get these medications from any pharmacy.         ibuprofen 600 MG tablet           Condition: stable Instructions: refer to practice specific booklet Discharge to: home Follow-up Information    Follow up with Kristin Lyons, CNM in 6 weeks. (If symptoms  worsen)    Contact information:   [address] Kristin Lyons [address] [phone]          Newborn Data: Live born  Information for the patient's newborn:  Kristin, Lyons [[id number]]  female ; APGAR , 8, 9  ; weight ; 5#10oz Home with mother.  Kristin Lyons 07/30/2011, 10:34 PM

## 2011-10-25 ENCOUNTER — Other Ambulatory Visit (INDEPENDENT_AMBULATORY_CARE_PROVIDER_SITE_OTHER): Payer: Medicaid Other

## 2011-10-25 DIAGNOSIS — Z3040 Encounter for surveillance of contraceptives, unspecified: Secondary | ICD-10-CM

## 2012-01-16 ENCOUNTER — Telehealth: Payer: Self-pay | Admitting: Obstetrics and Gynecology

## 2012-01-16 ENCOUNTER — Other Ambulatory Visit: Payer: BC Managed Care – PPO

## 2012-01-16 NOTE — Telephone Encounter (Signed)
Pt had appt for Depo Provera injection today.  When pt tried to fill Rx was told her insurance didn't cover injectable forms of birth control.  Pt wants appt to discuss Mirena. Inst. Pt to use back up birth control until appt.  Sch. appt for 02-07-2012 @ 2:50 with Dr. Estanislado Pandy.

## 2012-01-25 ENCOUNTER — Telehealth: Payer: Self-pay | Admitting: Obstetrics and Gynecology

## 2012-01-25 NOTE — Telephone Encounter (Signed)
Prior auth for Depot Povera received from pharmacy.  Tc to pharmacy.  Insurancdoes not cover injectable contraception, only oral.  Pt has appt 02/07/12 to discuss birth control.

## 2012-02-07 ENCOUNTER — Ambulatory Visit (INDEPENDENT_AMBULATORY_CARE_PROVIDER_SITE_OTHER): Payer: BC Managed Care – PPO | Admitting: Obstetrics and Gynecology

## 2012-02-07 ENCOUNTER — Encounter: Payer: Self-pay | Admitting: Obstetrics and Gynecology

## 2012-02-07 VITALS — BP 106/74 | Wt 152.0 lb

## 2012-02-07 DIAGNOSIS — Z975 Presence of (intrauterine) contraceptive device: Secondary | ICD-10-CM

## 2012-02-07 DIAGNOSIS — Z309 Encounter for contraceptive management, unspecified: Secondary | ICD-10-CM

## 2012-02-07 NOTE — Patient Instructions (Signed)
Mirena booklet

## 2012-02-07 NOTE — Progress Notes (Signed)
Subjective:  Pt is here today to discuss B/C options . Pt insurance will not pay for the depo. Pt wants to get mirena .  The following methods of contraception were reviewed with the patient:   Mirena  With expected benefits of: lack of estrogen,5 year duration, high reliability at 99.9%, reversibility, reduction or cessation of menstrual flow and improvement or resolution of dysmenorrhea/pelvic pain. Risks at the time of insertion were reviewed: dysfunctional uterine bleeding lasting up to 6 months, infection and uterine perforation which may require laparoscopy for retrieval.  Instructions for day of insertion discussed:  yes avoidance of unprotected intercourse 2 weeks prior, best to schedule during a menstrual cycle and use of Ibuprofen 600 mg 1 hour before appointment.   Nexplanon  With expected benefits of: 3 year duration, high reliability at 99.9%, lack of estrogen and ease of insertion. Risks of DUB and possible difficult removal discussed.  Objective:  Uterus RV GC/Chlamydia done  A/P:  Will schedule Mirena insertion

## 2012-02-08 ENCOUNTER — Other Ambulatory Visit: Payer: Self-pay

## 2012-02-08 ENCOUNTER — Telehealth: Payer: Self-pay

## 2012-02-08 LAB — GC/CHLAMYDIA PROBE AMP, GENITAL: GC Probe Amp, Genital: NEGATIVE

## 2012-02-08 MED ORDER — MEDROXYPROGESTERONE ACETATE 150 MG/ML IM SUSP: 150.0000 mg | Freq: Once | INTRAMUSCULAR | Status: DC

## 2012-02-08 NOTE — Telephone Encounter (Signed)
Pt notified that [employer] stated that the ins card is inactive.  Told pt that she or her mother needs to call and verify insurance.  May be using an expired card.  If so, she needs a new card and insurance should cover Depo Provera.  To call with update. ld

## 2012-02-08 NOTE — Telephone Encounter (Signed)
Spoke with pt and pts mother.  We were able to find out that we have the wrong ins card on file.  Told pt that she would need to bring new card in and make sure we get a copy.  Pt to call pharmacy to make sure they have the correct card.  Pt wants to continue Depo.  Will send new rx in for pt.  ld

## 2012-02-25 ENCOUNTER — Encounter: Payer: Self-pay | Admitting: Obstetrics and Gynecology

## 2012-02-25 ENCOUNTER — Ambulatory Visit (INDEPENDENT_AMBULATORY_CARE_PROVIDER_SITE_OTHER): Payer: BC Managed Care – PPO | Admitting: Obstetrics and Gynecology

## 2012-02-25 VITALS — BP 100/62 | HR 64 | Wt 151.0 lb

## 2012-02-25 DIAGNOSIS — Z30430 Encounter for insertion of intrauterine contraceptive device: Secondary | ICD-10-CM

## 2012-02-25 DIAGNOSIS — Z975 Presence of (intrauterine) contraceptive device: Secondary | ICD-10-CM

## 2012-02-25 DIAGNOSIS — IMO0001 Reserved for inherently not codable concepts without codable children: Secondary | ICD-10-CM

## 2012-02-25 MED ORDER — LEVONORGESTREL 20 MCG/24HR IU IUD
INTRAUTERINE_SYSTEM | Freq: Once | INTRAUTERINE | Status: AC
  Administered 2012-02-25: 1 via INTRAUTERINE

## 2012-02-25 NOTE — Progress Notes (Signed)
IUD INSERTION NOTE  Kristin Lyons is a 21 y.o. female [id number] who presents for IUD insertion.  Consent signed after risks and benefits were reviewed including but not limited to bleeding, infection, expulsion and risk of uterine perforation that may require an additional procedure for removal.  LMP: Patient's last menstrual period was 01/09/2012. UPT: negative  MIRENA LOT NUMBER: [id number]  Uterus assessed for size and position Prepped with Hibiclens  Tenaculum placed on anterior lip of cervix after Hurricane gel was applied Uterus sounded at  8 cm Insertion of MIRENA IUD per protocol without any complications Strings trimmed   Assessment:  IUD Insertion  Plan:  1. Patient instructed to call with oral temperature of 100.4 degrees Fahrenheit or more, excessive bleeding or pain that is not relieved with OTC analgesia taken as directed  2. Patient instructed on how  to check IUD strings and encouraged to do so after each menstrual cycle   3. Advised not to place anything in vagina or have sexual intercourse for 7 days  4. Follow-up: 4  weeks   Quetzali Heinle PA-C 02/25/2012 10:53 AM

## 2012-02-25 NOTE — Patient Instructions (Signed)
Keep appointment in 4 weeks  Call [facility] [phone]:  -for temperature of 100.4 degrees Fahrenheit or more -pain not improved with over the counter pain medications (Ibuprofen, Advil, Aleve,        Tylenol or acetaminophen) -for excessive bleeding (more than a usual period) -for any other concerns  Do not place anything in your vagina for the next 7 days

## 2012-02-25 NOTE — Addendum Note (Signed)
Addended byWinfred Leeds on: 02/25/2012 12:09 PM   Modules accepted: Orders

## 2012-03-10 ENCOUNTER — Telehealth: Payer: Self-pay | Admitting: Obstetrics and Gynecology

## 2012-03-10 NOTE — Telephone Encounter (Signed)
sr pt 

## 2012-03-11 NOTE — Telephone Encounter (Signed)
Pt c/o increased cramping since IUD inserted.  Lasted 2 days and took Midol and ibuprofen.  States its ok now.  Suggested patient call us if pain returns. Otherwise pt can take ibuprofen OTC. Pt agreeable.  ld

## 2012-03-24 ENCOUNTER — Encounter: Payer: Self-pay | Admitting: Obstetrics and Gynecology

## 2012-03-24 ENCOUNTER — Ambulatory Visit (INDEPENDENT_AMBULATORY_CARE_PROVIDER_SITE_OTHER): Payer: BC Managed Care – PPO | Admitting: Obstetrics and Gynecology

## 2012-03-24 VITALS — BP 104/62 | Wt 151.0 lb

## 2012-03-24 DIAGNOSIS — Z30431 Encounter for routine checking of intrauterine contraceptive device: Secondary | ICD-10-CM

## 2012-03-24 NOTE — Progress Notes (Signed)
20 YO with Mirena IUD for follow up.  Denies pelvic pain, vaginitis symptoms or heavy bleeding.  Has random spotting.  O: Abdomen: soft, non-tender        Pelvic: EGBUS-wnl, vagina-normal with beige vaginal discharge, cervix-no lesions, strings visible, uterus-normal size without tenderness, adnexae-no      masses  A: IUD Check up  P: RTO- August 2014 Annual Exam & PAP smear  Stellah Donovan, PA-C

## 2014-01-16 ENCOUNTER — Encounter (HOSPITAL_COMMUNITY): Payer: Self-pay | Admitting: Emergency Medicine

## 2014-01-16 ENCOUNTER — Emergency Department (HOSPITAL_COMMUNITY)
Admission: EM | Admit: 2014-01-16 | Discharge: 2014-01-17 | Disposition: A | Discharge status: Home / Self Care | Payer: BC Managed Care – PPO | Attending: Emergency Medicine | Admitting: Emergency Medicine

## 2014-01-16 DIAGNOSIS — R21 Rash and other nonspecific skin eruption: Secondary | ICD-10-CM | POA: Insufficient documentation

## 2014-01-16 DIAGNOSIS — Z8619 Personal history of other infectious and parasitic diseases: Secondary | ICD-10-CM | POA: Insufficient documentation

## 2014-01-16 DIAGNOSIS — Z79899 Other long term (current) drug therapy: Secondary | ICD-10-CM | POA: Insufficient documentation

## 2014-01-16 DIAGNOSIS — J45909 Unspecified asthma, uncomplicated: Secondary | ICD-10-CM | POA: Insufficient documentation

## 2014-01-16 NOTE — ED Notes (Signed)
The pt is c/o  A generalized rash since this am with itching.  No previous history.  She does not know what caused it.  No  Wheezes no difficulty breathing just the itch and rash

## 2014-01-17 MED ORDER — PREDNISONE 20 MG PO TABS: 60.0000 mg | ORAL_TABLET | Freq: Every day | ORAL | Status: DC

## 2014-01-17 MED ORDER — HYDROCORTISONE 2.5 % EX LOTN: TOPICAL_LOTION | Freq: Two times a day (BID) | CUTANEOUS | Status: DC

## 2014-01-17 NOTE — ED Provider Notes (Signed)
CSN: [id number]     Arrival date & time 01/16/14  2227 History   First MD Initiated Contact with Patient 01/17/14 0008     Chief Complaint  Patient presents with  . Rash     (Consider location/radiation/quality/duration/timing/severity/associated sxs/prior Treatment) HPI Comments: Patient presenting with a diffuse rash located on her arms, legs, and trunk that has been present since yesterday.  She reports that the rash does itch.  She denies any pain.  She has taken Benadryl, but does not feel that it helped.  She denies any new medications.  She denies any new detergents or lotions, but does report using a new kind of soap.  She denies fever or chills.  Denies swelling of the lips, tongue, or throat.  Denies SOB, nausea, or vomiting.  No known contacts with similar rash.  The history is provided by the patient.    Past Medical History  Diagnosis Date  . Asthma   . Asthma   . History of chicken pox   . Yeast infection   . Bacterial infection   . Trichomonas    Past Surgical History  Procedure Laterality Date  . No past surgeries    . Dilation and curettage of uterus     Family History  Problem Relation Age of Onset  . Hypertension Maternal Uncle   . Hypertension Paternal Uncle    History  Substance Use Topics  . Smoking status: Never Smoker   . Smokeless tobacco: Never Used  . Alcohol Use: No   OB History   Grav Para Term Preterm Abortions TAB SAB Ect Mult Living   2 1 1  0 1 0 1 0 0 1     Review of Systems  Constitutional: Negative for fever and chills.  Respiratory: Negative for shortness of breath and wheezing.   Gastrointestinal: Negative for nausea and vomiting.  Skin: Positive for rash.      Allergies  Shellfish allergy and Pollen extract  Home Medications   Prior to Admission medications   Medication Sig Start Date End Date Taking? Authorizing Provider  levonorgestrel (MIRENA) 20 MCG/24HR IUD 1 each by Intrauterine route once.    Historical  Provider, MD  medroxyPROGESTERone (DEPO-PROVERA) 150 MG/ML injection Inject 1 mL (150 mg total) into the muscle once. 02/08/12   Esmeralda ArthurSandra A Rivard, MD  Prenatal Vit-Fe Fumarate-FA (PRENATAL MULTIVITAMIN) TABS Take 1 tablet by mouth daily.      Historical Provider, MD   BP 131/63  Pulse 90  Temp(Src) 98.4 F (36.9 C) (Oral)  Resp 22  Ht 5\' 2"  (1.575 m)  Wt 165 lb 5 oz (74.985 kg)  BMI 30.23 kg/m2  SpO2 96% Physical Exam  Nursing note and vitals reviewed. Constitutional: She appears well-developed and well-nourished.  HENT:  Head: Normocephalic and atraumatic.  Mouth/Throat: Oropharynx is clear and moist.  Airway widely patent.  No swelling of the lips, tongue, or throat.  Neck: Normal range of motion. Neck supple.  Cardiovascular: Normal rate, regular rhythm and normal heart sounds.   Pulmonary/Chest: Effort normal and breath sounds normal.  Neurological: She is alert.  Skin: Skin is warm and dry. Rash noted.  Diffuse erythematous papular rash located on both arms, both legs, and trunk.  Psychiatric: She has a normal mood and affect.    ED Course  Procedures (including critical care time) Labs Review Labs Reviewed - No data to display  Imaging Review No results found.   EKG Interpretation None      MDM  Final diagnoses:  None   Patient presenting with diffuse erythematous papular rash.  She reports recent use of a new soap.  Patient is afebrile and nontoxic appearing.  No swelling of the lips, tongue, or throat.  No SOB.  Feel that the patient is stable for discharge.  Return precautions given.    Santiago GladHeather Rogue Rafalski, PA-C 01/19/14 0039  Santiago GladHeather Delinda Malan, PA-C 01/19/14 [phone]

## 2014-01-17 NOTE — ED Notes (Signed)
Declined W/C at D/C and was escorted to lobby by RN. 

## 2014-01-18 ENCOUNTER — Encounter (HOSPITAL_COMMUNITY): Payer: Self-pay | Admitting: Emergency Medicine

## 2014-01-18 ENCOUNTER — Emergency Department (HOSPITAL_COMMUNITY)
Admission: EM | Admit: 2014-01-18 | Discharge: 2014-01-18 | Disposition: A | Discharge status: Home / Self Care | Payer: BC Managed Care – PPO | Attending: Emergency Medicine | Admitting: Emergency Medicine

## 2014-01-18 DIAGNOSIS — J45909 Unspecified asthma, uncomplicated: Secondary | ICD-10-CM | POA: Insufficient documentation

## 2014-01-18 DIAGNOSIS — Z8619 Personal history of other infectious and parasitic diseases: Secondary | ICD-10-CM | POA: Insufficient documentation

## 2014-01-18 DIAGNOSIS — Z79899 Other long term (current) drug therapy: Secondary | ICD-10-CM | POA: Insufficient documentation

## 2014-01-18 DIAGNOSIS — R21 Rash and other nonspecific skin eruption: Secondary | ICD-10-CM | POA: Insufficient documentation

## 2014-01-18 DIAGNOSIS — IMO0002 Reserved for concepts with insufficient information to code with codable children: Secondary | ICD-10-CM | POA: Insufficient documentation

## 2014-01-18 LAB — BASIC METABOLIC PANEL
BUN: 8 mg/dL (ref 6–23)
CHLORIDE: 104 meq/L (ref 96–112)
CO2: 27 meq/L (ref 19–32)
Calcium: 9.1 mg/dL (ref 8.4–10.5)
Creatinine, Ser: 0.69 mg/dL (ref 0.50–1.10)
GFR calc non Af Amer: >90 mL/min (ref >90)
Glucose, Bld: 111 mg/dL — ABNORMAL HIGH (ref 70–99)
POTASSIUM: 3.9 meq/L (ref 3.7–5.3)
SODIUM: 143 meq/L (ref 137–147)

## 2014-01-18 LAB — URINALYSIS, ROUTINE W REFLEX MICROSCOPIC
Bilirubin Urine: NEGATIVE
GLUCOSE, UA: NEGATIVE mg/dL
Hgb urine dipstick: NEGATIVE
KETONES UR: NEGATIVE mg/dL
LEUKOCYTES UA: NEGATIVE
NITRITE: NEGATIVE
PH: 6.5 (ref 5.0–8.0)
Protein, ur: NEGATIVE mg/dL
SPECIFIC GRAVITY, URINE: 1.017 (ref 1.005–1.030)
Urobilinogen, UA: 0.2 mg/dL (ref 0.0–1.0)

## 2014-01-18 LAB — CBC WITH DIFFERENTIAL/PLATELET
BASOS ABS: 0 10*3/uL (ref 0.0–0.1)
BASOS PCT: 0 % (ref 0–1)
Eosinophils Absolute: 0.2 10*3/uL (ref 0.0–0.7)
Eosinophils Relative: 3 % (ref 0–5)
HCT: 39.6 % (ref 36.0–46.0)
Hemoglobin: 13.3 g/dL (ref 12.0–15.0)
LYMPHS PCT: 6 % — AB (ref 12–46)
Lymphs Abs: 0.3 10*3/uL — ABNORMAL LOW (ref 0.7–4.0)
MCH: 29.1 pg (ref 26.0–34.0)
MCHC: 33.6 g/dL (ref 30.0–36.0)
MCV: 86.7 fL (ref 78.0–100.0)
Monocytes Absolute: 0.1 10*3/uL (ref 0.1–1.0)
Monocytes Relative: 2 % — ABNORMAL LOW (ref 3–12)
NEUTROS ABS: 5.2 10*3/uL (ref 1.7–7.7)
NEUTROS PCT: 89 % — AB (ref 43–77)
Platelets: 246 10*3/uL (ref 150–400)
RBC: 4.57 MIL/uL (ref 3.87–5.11)
RDW: 13.2 % (ref 11.5–15.5)
WBC: 5.8 10*3/uL (ref 4.0–10.5)

## 2014-01-18 LAB — HIV ANTIBODY (ROUTINE TESTING W REFLEX): HIV 1&2 Ab, 4th Generation: NONREACTIVE

## 2014-01-18 LAB — RPR

## 2014-01-18 MED ORDER — DIPHENHYDRAMINE HCL 50 MG/ML IJ SOLN
25.0000 mg | Freq: Once | INTRAMUSCULAR | Status: AC
  Administered 2014-01-18: 25 mg via INTRAVENOUS
  Filled 2014-01-18: qty 1

## 2014-01-18 MED ORDER — PENICILLIN G BENZATHINE 1200000 UNIT/2ML IM SUSP
2.4000 10*6.[IU] | Freq: Once | INTRAMUSCULAR | Status: AC
  Administered 2014-01-18: 2.4 10*6.[IU] via INTRAMUSCULAR
  Filled 2014-01-18: qty 4

## 2014-01-18 MED ORDER — FAMOTIDINE IN NACL 20-0.9 MG/50ML-% IV SOLN
20.0000 mg | Freq: Once | INTRAVENOUS | Status: AC
  Administered 2014-01-18: 20 mg via INTRAVENOUS
  Filled 2014-01-18: qty 50

## 2014-01-18 MED ORDER — METHYLPREDNISOLONE SODIUM SUCC 125 MG IJ SOLR
125.0000 mg | Freq: Once | INTRAMUSCULAR | Status: AC
  Administered 2014-01-18: 125 mg via INTRAVENOUS
  Filled 2014-01-18: qty 2

## 2014-01-18 NOTE — ED Notes (Signed)
Pelvic cart is set up for patient

## 2014-01-18 NOTE — ED Notes (Signed)
Pt was in ER Friday for rash. She was started on prednisone and hydrocortisone for the rash but the rash is getting worse, now she has hives on her face and her lips are swollen. She denies any trouble swallowing or breathing. She is speaking in full unlabored sentences

## 2014-01-18 NOTE — ED Provider Notes (Signed)
CSN: [id number]     Arrival date & time 01/18/14  1024 History   First MD Initiated Contact with Patient 01/18/14 1108     Chief Complaint  Patient presents with  . Rash     (Consider location/radiation/quality/duration/timing/severity/associated sxs/prior Treatment) HPI Comments: 23 year old female presents to the emergency department complaining of a worsening rash x4 days. Patient states on Friday she developed a rash on her arms and legs, was seen in the emergency department 2 days ago and was put on prednisone and hydrocortisone. She's been taking the prednisone for the past 2 days and applying a lotion, however the rash is getting worse. Patient states 5 days ago she used a new soap, she generally uses regular Dove soap, however she tried a Designer, fashion/clothing that she has never used before. She stopped using the soap 2 days ago. Yesterday she noticed the rash was worsening and spreading to her hands, abdomen, chest, back and face. Her lips and tongue began to swell. Denies difficulty breathing or swallowing. Denies ever having a reaction like this before. Denies any contacts with similar rash. The rash is very itchy. She's been taking Benadryl every 8 hours. Denies any tick bites or known bug bites.  Patient is a 24 y.o. female presenting with rash. The history is provided by the patient.  Rash   Past Medical History  Diagnosis Date  . Asthma   . Asthma   . History of chicken pox   . Yeast infection   . Bacterial infection   . Trichomonas    Past Surgical History  Procedure Laterality Date  . No past surgeries    . Dilation and curettage of uterus     Family History  Problem Relation Age of Onset  . Hypertension Maternal Uncle   . Hypertension Paternal Uncle    History  Substance Use Topics  . Smoking status: Never Smoker   . Smokeless tobacco: Never Used  . Alcohol Use: No   OB History   Grav Para Term Preterm Abortions TAB SAB Ect Mult Living   2 1 1  0 1 0 1 0 0 1     Review of Systems  HENT: Positive for facial swelling.   Skin: Positive for rash.  All other systems reviewed and are negative.     Allergies  Shellfish allergy and Pollen extract  Home Medications   Prior to Admission medications   Medication Sig Start Date End Date Taking? Authorizing Provider  diphenhydrAMINE (SOMINEX) 25 MG tablet Take 50 mg by mouth daily as needed for itching or sleep.   Yes Historical Provider, MD  hydrocortisone 2.5 % lotion Apply topically 2 (two) times daily. 01/17/14  Yes Heather Laisure, PA-C  predniSONE (DELTASONE) 20 MG tablet Take 3 tablets (60 mg total) by mouth daily. 01/17/14  Yes Heather Laisure, PA-C   BP 122/53  Pulse 82  Temp(Src) 98.3 F (36.8 C) (Oral)  Resp 18  Ht 5\' 2"  (1.575 m)  Wt 165 lb (74.844 kg)  BMI 30.17 kg/m2  SpO2 96% Physical Exam  Nursing note and vitals reviewed. Constitutional: She is oriented to person, place, and time. She appears well-developed and well-nourished. No distress.  HENT:  Head: Normocephalic and atraumatic.  Mouth/Throat: Uvula is midline and oropharynx is clear and moist. No uvula swelling.  Airway patent. Swallows secretions well. No tongue swelling. Slight lip swelling.  Eyes: Conjunctivae are normal.  Neck: Normal range of motion. Neck supple.  Cardiovascular: Normal rate, regular rhythm and  normal heart sounds.   Pulmonary/Chest: Effort normal and breath sounds normal. No respiratory distress. She has no wheezes.  Genitourinary: Vagina normal.  Musculoskeletal: Normal range of motion. She exhibits no edema.  Neurological: She is alert and oriented to person, place, and time.  Skin: Skin is warm and dry. She is not diaphoretic.  Diffuse, scattered papular rash on bilateral arms, legs, abdomen, chest, back, neck and face. No mucosal lesions. Few on the palms of her hands. Spares soles of feet. No signs of secondary infection.  Psychiatric: She has a normal mood and affect. Her behavior is normal.     ED Course  Procedures (including critical care time) Labs Review Labs Reviewed  CBC WITH DIFFERENTIAL - Abnormal; Notable for the following:    Neutrophils Relative % 89 (*)    Lymphocytes Relative 6 (*)    Lymphs Abs 0.3 (*)    Monocytes Relative 2 (*)    All other components within normal limits  BASIC METABOLIC PANEL - Abnormal; Notable for the following:    Glucose, Bld 111 (*)    All other components within normal limits  GC/CHLAMYDIA PROBE AMP  URINALYSIS, ROUTINE W REFLEX MICROSCOPIC  RPR  HIV ANTIBODY (ROUTINE TESTING)    Imaging Review No results found.   EKG Interpretation None      MDM   Final diagnoses:  Rash   Patient presenting with worsening rash after using a new soap. She is well appearing and in no apparent distress. No respiratory or airway compromise. Rash is worsening despite oral steroids and topical hydrocortisone. Plan to give Solu-Medrol, Pepcid, Benadryl and reassess.  After eval by Dr. Eulis Foster, pt will have labs and genital screen given rash on palms of hands. Need to r/o syphilis.  2:53 PM Cbc, bmp normal. UA normal. GC/chlamydia cultures pending. HIV, RPR pending. 2.4 mil U IM PCN given. Pt reports no new sexual partners over the past year. Possible secondary syphilis vs erythema multiforme vs allergic reaction. Facial swelling decreased after IV medications. Denies drug abuse. F/u with urgent care for recheck in 2-3 days. Stable for d/c. Return precautions given. Patient states understanding of treatment care plan and is agreeable.   Illene Labrador, PA-C 01/18/14 [place], PA-C 01/18/14 1455

## 2014-01-18 NOTE — ED Notes (Signed)
Reviewed pt's discharge instructions. Pt will be monitored for 30 minutes prior to discharge.

## 2014-01-18 NOTE — ED Notes (Signed)
PA at bedside.

## 2014-01-18 NOTE — ED Provider Notes (Signed)
  Face-to-face evaluation   History:  Rash for several days, despite treatment with prednisone, and hydrocortisone cream. Rash is pruritic. She denies fever, chills, nausea, vomiting, abdominal pain, vaginal discharge, weakness, or dizziness.   Physical exam: Generalized red raised rash, concentrated on trunk, characterized by a discrete, red annular areas, almost confluent on the back. Includes presence on palms, but not soles. No urticaria, petechiae or draining.  Medical screening examination/treatment/procedure(s) were conducted as a shared visit with non-physician practitioner(s) and myself.  I personally evaluated the patient during the encounter  Flint MelterElliott L Wentz, MD 01/18/14 [phone]

## 2014-01-18 NOTE — Discharge Instructions (Signed)
Continue prednisone as prescribed. You were treated today in the event you may have syphilis. If this test results positive you will be contacted and are obligated to inform your partner.

## 2014-01-18 NOTE — ED Notes (Signed)
Pt has rash to entire body that is flat, red individual red bumps. Denies pain or itching, beside itching to her back. Pt states [facility] States that the rash has progressively worsened and then yesterday she started to develop swelling to upper and lower lips. Airway intact. Denies swelling tongue or throat.

## 2014-01-19 LAB — GC/CHLAMYDIA PROBE AMP
CT PROBE, AMP APTIMA: NEGATIVE
GC PROBE AMP APTIMA: NEGATIVE

## 2014-01-19 NOTE — ED Provider Notes (Signed)
Medical screening examination/treatment/procedure(s) were performed by non-physician practitioner and as supervising physician I was immediately available for consultation/collaboration.   EKG Interpretation None        Shandelle Borrelli, MD 01/19/14 0707 

## 2014-01-20 ENCOUNTER — Emergency Department (INDEPENDENT_AMBULATORY_CARE_PROVIDER_SITE_OTHER)
Admission: EM | Admit: 2014-01-20 | Discharge: 2014-01-20 | Disposition: A | Discharge status: Home / Self Care | Payer: BC Managed Care – PPO | Source: Home / Self Care | Attending: Family Medicine | Admitting: Family Medicine

## 2014-01-20 ENCOUNTER — Encounter (HOSPITAL_COMMUNITY): Payer: Self-pay | Admitting: Emergency Medicine

## 2014-01-20 DIAGNOSIS — L42 Pityriasis rosea: Secondary | ICD-10-CM

## 2014-01-20 MED ORDER — HYDROCORTISONE 2.5 % EX LOTN: TOPICAL_LOTION | Freq: Two times a day (BID) | CUTANEOUS | Status: DC | PRN

## 2014-01-20 NOTE — Discharge Instructions (Signed)
Thank you for coming in today. Use the lotion as needed for itching. Do not use much on the face.  Come back as needed.  Sunlight exposure may help some.  It should get better in about 6 weeks.  Call or go to the emergency room if you get worse, have trouble breathing, have chest pains, or palpitations.    Pityriasis Rosea Pityriasis rosea is a rash which is probably caused by a virus. It generally starts as a scaly, red patch on the trunk (the area of the body that a t-shirt would cover) but does not appear on sun exposed areas. The rash is usually preceded by an initial larger spot called the "herald patch" a week or more before the rest of the rash appears. Generally within one to two days the rash appears rapidly on the trunk, upper arms, and sometimes the upper legs. The rash usually appears as flat, oval patches of scaly pink color. The rash can also be raised and one is able to feel it with a finger. The rash can also be finely crinkled and may slough off leaving a ring of scale around the spot. Sometimes a mild sore throat is present with the rash. It usually affects children and young adults in the spring and autumn. Women are more frequently affected than men. TREATMENT  Pityriasis rosea is a self-limited condition. This means it goes away within 4 to 8 weeks without treatment. The spots may persist for several months, especially in darker-colored skin after the rash has resolved and healed. Benadryl and steroid creams may be used if itching is a problem. SEEK MEDICAL CARE IF:   Your rash does not go away or persists longer than three months.  You develop fever and joint pain.  You develop severe headache and confusion.  You develop breathing difficulty, vomiting and/or extreme weakness. Document Released: 08/22/2001 Document Revised: 10/08/2011 Document Reviewed: 09/10/2008 [facility] Patient Information 2015 [employer] [place]. This information is not intended to replace advice given to  you by your health care provider. Make sure you discuss any questions you have with your health care provider.

## 2014-01-20 NOTE — ED Provider Notes (Signed)
Kristin Lyons is a 23 y.o. female who presents to Urgent Care today for rash. Patient notes a several-day history of a mildly pruritic macular rash across the majority of her body. This started as a large patch on her left shoulder along with some mild fevers and chills. The rash dramatically worsened and she was seen in the emergency room. At this point the diagnosis was unclear and she was given prednisone, hydrocortisone lotion and Benadryl which helped a little. She currently feels well with no fevers or chills lip or tongue swelling or trouble breathing. She is not taking any medications aside from the ones that were prescribed to her recently. She denies any seafood and feels well. No new soaps detergents or shampoos et Karie Sodacetera.   Past Medical History  Diagnosis Date  . Asthma   . Asthma   . History of chicken pox   . Yeast infection   . Bacterial infection   . Trichomonas    History  Substance Use Topics  . Smoking status: Never Smoker   . Smokeless tobacco: Never Used  . Alcohol Use: No   ROS as above Medications: No current facility-administered medications for this encounter.   Current Outpatient Prescriptions  Medication Sig Dispense Refill  . diphenhydrAMINE (SOMINEX) 25 MG tablet Take 50 mg by mouth daily as needed for itching or sleep.      . hydrocortisone 2.5 % lotion Apply topically 2 (two) times daily as needed. itching  118 mL  3    Exam:  BP 119/81  Pulse 74  Temp(Src) 98.3 F (36.8 C) (Oral)  Resp 16  SpO2 100% Gen: Well NAD HEENT: EOMI,  MMM no mucocutaneous involvement Lungs: Normal work of breathing. CTABL Heart: RRR no MRG Exts: Brisk capillary refill, warm and well perfused.  Skin: Extensive erythematous macular rash across trunk and extremities. Christmas tree pattern on her back.  No results found for this or any previous visit (from the past 24 hour(s)). No results found.  Assessment and Plan: 23 y.o. female with pityriasis rosea. Plan to  use hydrocortisone lotion as needed. Handout provided. Followup as needed.  Discussed warning signs or symptoms. Please see discharge instructions. Patient expresses understanding.    Rodolph BongEvan S Corey, MD 01/20/14 1040

## 2014-01-20 NOTE — ED Notes (Signed)
Pt here for follow up visit 6/20, allergic reaction.  Pt states that she feels a lot better.   Rash seems to be clearing up.  Pt voices no concerns.

## 2014-05-31 ENCOUNTER — Encounter (HOSPITAL_COMMUNITY): Payer: Self-pay | Admitting: Emergency Medicine

## 2014-06-22 ENCOUNTER — Encounter (HOSPITAL_COMMUNITY): Payer: Self-pay

## 2014-06-22 ENCOUNTER — Inpatient Hospital Stay (HOSPITAL_COMMUNITY)
Admission: AD | Admit: 2014-06-22 | Discharge: 2014-06-22 | Disposition: A | Discharge status: Home / Self Care | Payer: BC Managed Care – PPO | Source: Ambulatory Visit | Attending: Obstetrics and Gynecology | Admitting: Obstetrics and Gynecology

## 2014-06-22 DIAGNOSIS — O219 Vomiting of pregnancy, unspecified: Secondary | ICD-10-CM

## 2014-06-22 DIAGNOSIS — Z3A01 Less than 8 weeks gestation of pregnancy: Secondary | ICD-10-CM | POA: Insufficient documentation

## 2014-06-22 DIAGNOSIS — Z3201 Encounter for pregnancy test, result positive: Secondary | ICD-10-CM | POA: Diagnosis not present

## 2014-06-22 DIAGNOSIS — O218 Other vomiting complicating pregnancy: Secondary | ICD-10-CM | POA: Diagnosis not present

## 2014-06-22 LAB — POCT PREGNANCY, URINE: Preg Test, Ur: POSITIVE — AB

## 2014-06-22 MED ORDER — PROMETHAZINE HCL 25 MG PO TABS: 12.5000 mg | ORAL_TABLET | Freq: Four times a day (QID) | ORAL | Status: DC | PRN

## 2014-06-22 NOTE — Discharge Instructions (Signed)
Nausea medication to take during pregnancy:  ° °Unisom (doxylamine succinate 25 mg tablets) Take one tablet daily at bedtime. If symptoms are not adequately controlled, the dose can be increased to a maximum recommended dose of two tablets daily (1/2 tablet in the morning, 1/2 tablet mid-afternoon and one at bedtime). ° °Vitamin B6 100mg tablets. Take one tablet twice a day (up to 200 mg per day). ° °Add Phenergan as prescribed to take as needed.  ° °Morning Sickness °Morning sickness is when you feel sick to your stomach (nauseous) during pregnancy. This nauseous feeling may or may not come with vomiting. It often occurs in the morning but can be a problem any time of day. Morning sickness is most common during the first trimester, but it may continue throughout pregnancy. While morning sickness is unpleasant, it is usually harmless unless you develop severe and continual vomiting (hyperemesis gravidarum). This condition requires more intense treatment.  °CAUSES  °The cause of morning sickness is not completely known but seems to be related to normal hormonal changes that occur in pregnancy. °RISK FACTORS °You are at greater risk if you: °· Experienced nausea or vomiting before your pregnancy. °· Had morning sickness during a previous pregnancy. °· Are pregnant with more than one baby, such as twins. °TREATMENT  °Do not use any medicines (prescription, over-the-counter, or herbal) for morning sickness without first talking to your health care provider. Your health care provider may prescribe or recommend: °· Vitamin B6 supplements. °· Anti-nausea medicines. °· The herbal medicine ginger. °HOME CARE INSTRUCTIONS  °· Only take over-the-counter or prescription medicines as directed by your health care provider. °· Taking multivitamins before getting pregnant can prevent or decrease the severity of morning sickness in most women. °· Eat a piece of dry toast or unsalted crackers before getting out of bed in the  morning. °· Eat five or six small meals a day. °· Eat dry and bland foods (rice, baked potato). Foods high in carbohydrates are often helpful. °· Do not drink liquids with your meals. Drink liquids between meals. °· Avoid greasy, fatty, and spicy foods. °· Get someone to cook for you if the smell of any food causes nausea and vomiting. °· If you feel nauseous after taking prenatal vitamins, take the vitamins at night or with a snack.  °· Snack on protein foods (nuts, yogurt, cheese) between meals if you are hungry. °· Eat unsweetened gelatins for desserts. °· Wearing an acupressure wristband (worn for sea sickness) may be helpful. °· Acupuncture may be helpful. °· Do not smoke. °· Get a humidifier to keep the air in your house free of odors. °· Get plenty of fresh air. °SEEK MEDICAL CARE IF:  °· Your home remedies are not working, and you need medicine. °· You feel dizzy or lightheaded. °· You are losing weight. °SEEK IMMEDIATE MEDICAL CARE IF:  °· You have persistent and uncontrolled nausea and vomiting. °· You pass out (faint). °MAKE SURE YOU: °· Understand these instructions. °· Will watch your condition. °· Will get help right away if you are not doing well or get worse. °Document Released: 09/06/2006 Document Revised: 07/21/2013 Document Reviewed: 12/31/2012 °ExitCare® Patient Information ©2015 [employer]. This information is not intended to replace advice given to you by your health care provider. Make sure you discuss any questions you have with your health care provider. ° °

## 2014-06-22 NOTE — MAU Provider Note (Signed)
S: 23 y.o. G3P1011 @[redacted]w[redacted]d  by LMP presents to MAU for verification of pregnancy.  She denies pain or vaginal bleeding today.  She does report daily nausea, but has not vomited.  She would like medication to try to improve the nausea.   O: BP 140/60 mmHg  Pulse 89  Temp(Src) 98.4 F (36.9 C) (Oral)  Resp 16  Ht 5' 3.5" (1.613 m)  Wt 78.382 kg (172 lb 12.8 oz)  BMI 30.13 kg/m2  SpO2 98%  LMP 05/03/2014  Physical Examination: General appearance - alert, well appearing, and in no distress, oriented to person, place, and time and acyanotic, in no respiratory distress  Results for orders placed or performed during the hospital encounter of 06/22/14 (from the past 24 hour(s))  Pregnancy, urine POC     Status: Abnormal   Collection Time: 06/22/14  2:51 PM  Result Value Ref Range   Preg Test, Ur POSITIVE (A) NEGATIVE   A:  1. Nausea and vomiting during pregnancy prior to [redacted] weeks gestation   2. Positive urine pregnancy test    P: D/C home Discussed use of Unisom/B6 for nausea Phenergan 12.5-25 mg Q 6 hours PRN F/U with [facility] as planned Return to MAU as needed for emergencies  Sharen CounterLisa Leftwich-Kirby Certified Nurse-Midwife

## 2014-06-22 NOTE — MAU Note (Signed)
Patient states she has had 2 positive home pregnancy tests and wants confirmation. Has nausea, no vomiting  Denies pain, bleeding or discharge.

## 2014-07-10 ENCOUNTER — Encounter (HOSPITAL_COMMUNITY): Payer: Self-pay

## 2014-07-10 ENCOUNTER — Inpatient Hospital Stay (HOSPITAL_COMMUNITY)
Admission: AD | Admit: 2014-07-10 | Discharge: 2014-07-10 | Disposition: A | Discharge status: Home / Self Care | Payer: BC Managed Care – PPO | Source: Ambulatory Visit | Attending: Family Medicine | Admitting: Family Medicine

## 2014-07-10 DIAGNOSIS — R109 Unspecified abdominal pain: Secondary | ICD-10-CM | POA: Insufficient documentation

## 2014-07-10 DIAGNOSIS — Z3A09 9 weeks gestation of pregnancy: Secondary | ICD-10-CM | POA: Diagnosis not present

## 2014-07-10 DIAGNOSIS — O26899 Other specified pregnancy related conditions, unspecified trimester: Secondary | ICD-10-CM

## 2014-07-10 DIAGNOSIS — O9989 Other specified diseases and conditions complicating pregnancy, childbirth and the puerperium: Secondary | ICD-10-CM | POA: Insufficient documentation

## 2014-07-10 LAB — URINALYSIS, ROUTINE W REFLEX MICROSCOPIC
BILIRUBIN URINE: NEGATIVE
GLUCOSE, UA: NEGATIVE mg/dL
HGB URINE DIPSTICK: NEGATIVE
Ketones, ur: NEGATIVE mg/dL
Leukocytes, UA: NEGATIVE
Nitrite: NEGATIVE
PROTEIN: NEGATIVE mg/dL
Specific Gravity, Urine: 1.025 (ref 1.005–1.030)
Urobilinogen, UA: 1 mg/dL (ref 0.0–1.0)
pH: 6.5 (ref 5.0–8.0)

## 2014-07-10 LAB — WET PREP, GENITAL
Clue Cells Wet Prep HPF POC: NONE SEEN
Trich, Wet Prep: NONE SEEN
Yeast Wet Prep HPF POC: NONE SEEN

## 2014-07-10 LAB — RPR

## 2014-07-10 LAB — HIV ANTIBODY (ROUTINE TESTING W REFLEX): HIV 1&2 Ab, 4th Generation: NONREACTIVE

## 2014-07-10 NOTE — Discharge Instructions (Signed)

## 2014-07-10 NOTE — MAU Provider Note (Signed)
History     CSN: [id number]  Arrival date and time: 07/10/14 1431   [id number] Provider Initiated Contact with Patient 07/10/14 1506      Chief Complaint  Patient presents with  . Abdominal Pain   HPI  Kristin Lyons is a 23 y.o. G3P1011 at 3458w5d. She presents with c/o low ML cramping x 4 d. It is off/on, like going to start your period. No spotting or bleeding, no change in discharge, odor or itching. No urinary frequency, urgency or dysuria. No constipation or diarrhea- has nausea. [facility]  OB History    Gravida Para Term Preterm AB TAB SAB Ectopic Multiple Living   3 1 1  0 1 0 1 0 0 1      Past Medical History  Diagnosis Date  . Asthma   . Asthma   . History of chicken pox   . Yeast infection   . Bacterial infection   . Trichomonas     Past Surgical History  Procedure Laterality Date  . No past surgeries    . Dilation and curettage of uterus      Family History  Problem Relation Age of Onset  . Hypertension Maternal Uncle   . Hypertension Paternal Uncle     History  Substance Use Topics  . Smoking status: Never Smoker   . Smokeless tobacco: Never Used  . Alcohol Use: No    Allergies:  Allergies  Allergen Reactions  . Shellfish Allergy Hives  . Pollen Extract     Prescriptions prior to admission  Medication Sig Dispense Refill Last Dose  . promethazine (PHENERGAN) 25 MG tablet Take 0.5-1 tablets (12.5-25 mg total) by mouth every 6 (six) hours as needed. 30 tablet 2     ROS Physical Exam   Blood pressure 138/77, pulse 98, temperature 98 F (36.7 C), temperature source Oral, resp. rate 18, last menstrual period 05/03/2014.  Physical Exam  Constitutional: She is oriented to person, place, and time. She appears well-developed and well-nourished.  GI: Soft. There is no tenderness.  +FHT's  Genitourinary:  Pelvic exam- Ext gen- nl anatomy,skin intact Vagina- small amt thin white discharge Cx- parous, long, firm Uterus-  enlarged, non tender Adn- no masses palp, non tender  Musculoskeletal: Normal range of motion.  Neurological: She is alert and oriented to person, place, and time.  Skin: Skin is warm and dry.  Psychiatric: She has a normal mood and affect. Her behavior is normal.    MAU Course  Procedures  MDM Results for orders placed or performed during the hospital encounter of 07/10/14 (from the past 24 hour(s))  Urinalysis, Routine w reflex microscopic     Status: None   Collection Time: 07/10/14  2:50 PM  Result Value Ref Range   Color, Urine YELLOW YELLOW   APPearance CLEAR CLEAR   Specific Gravity, Urine 1.025 1.005 - 1.030   pH 6.5 5.0 - 8.0   Glucose, UA NEGATIVE NEGATIVE mg/dL   Hgb urine dipstick NEGATIVE NEGATIVE   Bilirubin Urine NEGATIVE NEGATIVE   Ketones, ur NEGATIVE NEGATIVE mg/dL   Protein, ur NEGATIVE NEGATIVE mg/dL   Urobilinogen, UA 1.0 0.0 - 1.0 mg/dL   Nitrite NEGATIVE NEGATIVE   Leukocytes, UA NEGATIVE NEGATIVE  Wet prep, genital     Status: Abnormal   Collection Time: 07/10/14  3:20 PM  Result Value Ref Range   Yeast Wet Prep HPF POC NONE SEEN NONE SEEN   Trich, Wet Prep NONE SEEN NONE SEEN  Clue Cells Wet Prep HPF POC NONE SEEN NONE SEEN   WBC, Wet Prep HPF POC MODERATE (A) NONE SEEN     Assessment and Plan  9 5/7 wks with low abd pain, no bleeding Cervix closed Urine and wet prep are negative + FHT's  [facility] Return with other symptoms   Terik Haughey M. 07/10/2014, 3:10 PM

## 2014-07-10 NOTE — MAU Note (Signed)
Pt presents complaining of abdominal pain that started Tuesday and has been ongoing. States Tyelnol does help some. Denies vaginal bleeding or discharge.

## 2014-07-12 LAB — GC/CHLAMYDIA PROBE AMP
CT Probe RNA: NEGATIVE
GC Probe RNA: NEGATIVE

## 2014-07-13 ENCOUNTER — Ambulatory Visit: Payer: BC Managed Care – PPO | Admitting: Internal Medicine

## 2014-07-20 ENCOUNTER — Ambulatory Visit: Payer: BC Managed Care – PPO | Admitting: Internal Medicine

## 2014-07-30 NOTE — L&D Delivery Note (Addendum)
Delivery Note At 1:32 AM a non-viable female was delivered via Vaginal, Spontaneous Delivery (Presentation: ;  ).  APGAR: 0, 0; weight  .   Placenta status: Currently still in situ.  Cord:  with the following complications: None  No obvious anomalies were noted.  Anesthesia: None  Episiotomy: None Lacerations:  None Suture Repair: None Est. Blood Loss (mL):  Minimal at delivery  Mom to 3rd floor.  Baby to be cremated. Patient had no support persons present during the labor/birth. Placenta still in utero despite gentle traction.   Pitocin infusion begun to assist with placental delivery. Will continue to observe for placental separation. Patient holding baby.   Kristin Lyons 09/17/2014, 1:49 AM  Addendum: Placenta still in-situ at 0215. No active bleeding. VSS. Consulted with Dr. Charlotta Newtonzan. Cytotech 800 mcg placed in rectum. Will observe at present for spontaneous placental separation x 4-6 hours if remains stable--will do gentle traction q 1 hour or prn. Will intervene with manual extraction or D&C if heavy bleeding occurs, or if patient becomes unstable.  Nigel BridgemanVicki Albirtha Lyons, CNM 09/17/14 0230  Addendum: Placenta still in-situ at 0420, 3 hours post-delivery Minimal bleeding. Filed Vitals:   09/16/14 1718 09/16/14 1725 09/16/14 1944 09/16/14 2320  BP: 133/83  129/66 110/51  Pulse: 81  87 98  Temp: 98.6 F (37 C)  98 F (36.7 C) 98.6 F (37 C)  TempSrc: Oral  Oral   Resp: 18  22 16   Height:  5\' 2"  (1.575 m)    Weight:  180 lb (81.647 kg)     Cord avulsed with gentle traction.  Kellly clamp replaced on cord remnant/membrane just inside vagina. Lower portion of placenta feels to be in posterior vagina, but unable to remove manually at present without risk of fragmenting. Does not extrude with maternal pushing efforts. Single 3 cm clot expelled. Patient up to BR in an attempt to use gravity for separation--no placental delivery. Patient has voided in the bed. 300 cc pitocin  bolus infused.  Will continue to observe at present, since vitals are stable and bleeding is minimal. Re-evaluate in 1 hour or prn.  Nigel BridgemanVicki Davida Lyons, CNM 09/17/14 0430  Addendum: Patient resting comfortably. Minimal bleeding, no pain.  Filed Vitals:   09/16/14 1725 09/16/14 1944 09/16/14 2320 09/17/14 0448  BP:  129/66 110/51 122/51  Pulse:  87 98 97  Temp:  98 F (36.7 C) 98.6 F (37 C) 98.1 F (36.7 C)  TempSrc:  Oral    Resp:  22 16 16   Height: 5\' 2"  (1.575 m)     Weight: 180 lb (81.647 kg)      No extrusion of placenta with maternal push and gentle traction on Kelly. Reviewed possible need for D&E with patient, including bleeding, infection, and damage to other organs. Advised patient if no placental delivery by Ronette Deter7am, [facility] MD will determine plan of care. Will update Dr. Charlotta Newtonzan at [phone].  Nigel BridgemanVicki Novella Lyons, CNM 09/16/14 [id number]

## 2014-08-25 ENCOUNTER — Encounter (HOSPITAL_COMMUNITY): Payer: Self-pay | Admitting: *Deleted

## 2014-08-25 ENCOUNTER — Inpatient Hospital Stay (HOSPITAL_COMMUNITY)
Admission: AD | Admit: 2014-08-25 | Discharge: 2014-08-25 | Disposition: A | Discharge status: Home / Self Care | Payer: BLUE CROSS/BLUE SHIELD | Source: Ambulatory Visit | Attending: Obstetrics & Gynecology | Admitting: Obstetrics & Gynecology

## 2014-08-25 DIAGNOSIS — M549 Dorsalgia, unspecified: Secondary | ICD-10-CM | POA: Insufficient documentation

## 2014-08-25 DIAGNOSIS — O9989 Other specified diseases and conditions complicating pregnancy, childbirth and the puerperium: Secondary | ICD-10-CM | POA: Diagnosis not present

## 2014-08-25 DIAGNOSIS — Z3A16 16 weeks gestation of pregnancy: Secondary | ICD-10-CM | POA: Diagnosis not present

## 2014-08-25 DIAGNOSIS — O26899 Other specified pregnancy related conditions, unspecified trimester: Secondary | ICD-10-CM

## 2014-08-25 DIAGNOSIS — O26852 Spotting complicating pregnancy, second trimester: Secondary | ICD-10-CM | POA: Diagnosis present

## 2014-08-25 LAB — URINALYSIS, ROUTINE W REFLEX MICROSCOPIC
Bilirubin Urine: NEGATIVE
Glucose, UA: NEGATIVE mg/dL
HGB URINE DIPSTICK: NEGATIVE
KETONES UR: NEGATIVE mg/dL
Nitrite: NEGATIVE
PROTEIN: NEGATIVE mg/dL
SPECIFIC GRAVITY, URINE: 1.02 (ref 1.005–1.030)
UROBILINOGEN UA: 1 mg/dL (ref 0.0–1.0)
pH: 8 (ref 5.0–8.0)

## 2014-08-25 LAB — URINE MICROSCOPIC-ADD ON

## 2014-08-25 LAB — WET PREP, GENITAL
Clue Cells Wet Prep HPF POC: NONE SEEN
Trich, Wet Prep: NONE SEEN
Yeast Wet Prep HPF POC: NONE SEEN

## 2014-08-25 LAB — OB RESULTS CONSOLE GC/CHLAMYDIA: Gonorrhea: NEGATIVE

## 2014-08-25 NOTE — MAU Provider Note (Signed)
History     CSN: [id number]  Arrival date and time: 08/25/14 [id number]   First Provider Initiated Contact with Patient 08/25/14 1009      Chief Complaint  Patient presents with  . Back Pain  . Vaginal Bleeding   The history is provided by the patient. No language interpreter was used.     [name] is a 24 y.o. female G3P1011 at 7856w2d who presents with vaginal spotting. The spotting started on Monday. She is not currently having any spotting in MAU. The last time she noticed the spotting was this morning when she wiped after urinating; it was pink in color, scant amount.  No intercourse recently. She also complains of back pain that she has had throughout the pregnancy. She has tried taking tylenol which has helped some.   [facility]; pending med[facility]. She has her first appointment scheduled for Tuesday.   OB History    Gravida Para Term Preterm AB TAB SAB Ectopic Multiple Living   3 1 1  0 1 0 1 0 0 1      Past Medical History  Diagnosis Date  . Asthma   . Asthma   . History of chicken pox   . Yeast infection   . Bacterial infection   . Trichomonas     Past Surgical History  Procedure Laterality Date  . Dilation and curettage of uterus      Family History  Problem Relation Age of Onset  . Hypertension Maternal Uncle   . Hypertension Paternal Uncle     History  Substance Use Topics  . Smoking status: Never Smoker   . Smokeless tobacco: Never Used  . Alcohol Use: No    Allergies:  Allergies  Allergen Reactions  . Shellfish Allergy Hives  . Pollen Extract     Prescriptions prior to admission  Medication Sig Dispense Refill Last Dose  . acetaminophen (TYLENOL) 500 MG tablet Take 500 mg by mouth every 6 (six) hours as needed for mild pain.   07/10/2014 at Unknown time  . promethazine (PHENERGAN) 25 MG tablet Take 0.5-1 tablets (12.5-25 mg total) by mouth every 6 (six) hours as needed. 30 tablet 2 Past Week at Unknown time    Results for orders placed or performed during the hospital encounter of 08/25/14 (from the past 48 hour(s))  Urinalysis, Routine w reflex microscopic     Status: Abnormal   Collection Time: 08/25/14  9:45 AM  Result Value Ref Range   Color, Urine YELLOW YELLOW   APPearance HAZY (A) CLEAR   Specific Gravity, Urine 1.020 1.005 - 1.030   pH 8.0 5.0 - 8.0   Glucose, UA NEGATIVE NEGATIVE mg/dL   Hgb urine dipstick NEGATIVE NEGATIVE   Bilirubin Urine NEGATIVE NEGATIVE   Ketones, ur NEGATIVE NEGATIVE mg/dL   Protein, ur NEGATIVE NEGATIVE mg/dL   Urobilinogen, UA 1.0 0.0 - 1.0 mg/dL   Nitrite NEGATIVE NEGATIVE   Leukocytes, UA TRACE (A) NEGATIVE  Urine microscopic-add on     Status: Abnormal   Collection Time: 08/25/14  9:45 AM  Result Value Ref Range   Squamous Epithelial / LPF FEW (A) RARE   WBC, UA 7-10 <3 WBC/hpf   Bacteria, UA FEW (A) RARE  Wet prep, genital     Status: Abnormal   Collection Time: 08/25/14 10:21 AM  Result Value Ref Range   Yeast Wet Prep HPF POC NONE SEEN NONE SEEN   Trich, Wet Prep NONE SEEN NONE  SEEN   Clue Cells Wet Prep HPF POC NONE SEEN NONE SEEN   WBC, Wet Prep HPF POC FEW (A) NONE SEEN    Comment: MODERATE BACTERIA SEEN    Review of Systems  Constitutional: Negative for fever and chills.  Gastrointestinal: Positive for nausea. Negative for vomiting, abdominal pain, diarrhea and constipation.  Genitourinary: Negative for dysuria, urgency, frequency, hematuria and flank pain.  Musculoskeletal: Positive for back pain (Bilateral lower back pain ).   Physical Exam   Blood pressure 123/60, pulse 98, temperature 98.1 F (36.7 C), temperature source Oral, resp. rate 18, height  (1.575 m), weight 79.379 kg (175 lb), last menstrual period 05/03/2014.  Physical Exam  Constitutional: She is oriented to person, place, and time. She appears well-developed and well-nourished. No distress.  HENT:  Head: Normocephalic.  Eyes: Pupils are equal, round, and  reactive to light.  Neck: Neck supple.  GI: Soft. There is no tenderness. There is no CVA tenderness.  Genitourinary:  Speculum exam: Vagina - Small amount of creamy, stringy, white discharge, no odor Cervix - No contact bleeding, no active bleeding  Bimanual exam: Cervix closed, posterior  Uterus non tender, enlarged  Adnexa non tender, no masses bilaterally GC/Chlam, wet prep done Chaperone present for exam.   Musculoskeletal: Normal range of motion.       Lumbar back: She exhibits normal range of motion and no tenderness.  Neurological: She is alert and oriented to person, place, and time.  Skin: Skin is warm. She is not diaphoretic.  Psychiatric: Her behavior is normal.    MAU Course  Procedures  None  MDM +fht by doppler  Wet prep GC HIV    Assessment and Plan   A:  1. Back pain in pregnancy    P:  Discharge home in stable condition  Start prenatal care ASAP [facility] contact given if patient needs it Return to MAU as needed, if symptoms worsen  OK to take tylenol as needed, as directed on the bottle   Iona Hansen Rasch, NP 08/25/2014 10:29 AM   Attestation of Attending Supervision of Advanced Practitioner (CNM/NP): Evaluation and management procedures were performed by the Advanced Practitioner under my supervision and collaboration.  I have reviewed the Advanced Practitioner's note and chart, and I agree with the management and plan.  HARRAWAY-SMITH, Dannell Raczkowski 5:08 PM

## 2014-08-25 NOTE — MAU Note (Signed)
Lower back pain for the past week, has been taking tylenol.  Started spotting on Monday, having to wear a panti liner. Still spotting today, pink.

## 2014-08-25 NOTE — Discharge Instructions (Signed)

## 2014-08-26 LAB — GC/CHLAMYDIA PROBE AMP (~~LOC~~) NOT AT ARMC
Chlamydia: NEGATIVE
Neisseria Gonorrhea: NEGATIVE

## 2014-08-26 LAB — HIV ANTIBODY (ROUTINE TESTING W REFLEX): HIV Screen 4th Generation wRfx: NONREACTIVE

## 2014-09-16 ENCOUNTER — Inpatient Hospital Stay (HOSPITAL_COMMUNITY)
Admission: AD | Admit: 2014-09-16 | Discharge: 2014-09-17 | DRG: 767 | Disposition: A | Discharge status: Home / Self Care | Payer: Medicaid Other | Source: Ambulatory Visit | Attending: Obstetrics and Gynecology | Admitting: Obstetrics and Gynecology

## 2014-09-16 ENCOUNTER — Inpatient Hospital Stay (HOSPITAL_COMMUNITY): Admit: 2014-09-16 | Payer: Medicaid Other

## 2014-09-16 ENCOUNTER — Inpatient Hospital Stay (HOSPITAL_COMMUNITY): Payer: Medicaid Other

## 2014-09-16 ENCOUNTER — Encounter (HOSPITAL_COMMUNITY): Payer: Self-pay | Admitting: *Deleted

## 2014-09-16 DIAGNOSIS — O364XX0 Maternal care for intrauterine death, not applicable or unspecified: Principal | ICD-10-CM | POA: Diagnosis present

## 2014-09-16 DIAGNOSIS — IMO0002 Reserved for concepts with insufficient information to code with codable children: Secondary | ICD-10-CM | POA: Diagnosis present

## 2014-09-16 DIAGNOSIS — O99512 Diseases of the respiratory system complicating pregnancy, second trimester: Secondary | ICD-10-CM | POA: Diagnosis present

## 2014-09-16 DIAGNOSIS — O368390 Maternal care for abnormalities of the fetal heart rate or rhythm, unspecified trimester, not applicable or unspecified: Secondary | ICD-10-CM

## 2014-09-16 DIAGNOSIS — Z91048 Other nonmedicinal substance allergy status: Secondary | ICD-10-CM

## 2014-09-16 DIAGNOSIS — Z3A19 19 weeks gestation of pregnancy: Secondary | ICD-10-CM | POA: Diagnosis present

## 2014-09-16 DIAGNOSIS — Z91013 Allergy to seafood: Secondary | ICD-10-CM

## 2014-09-16 DIAGNOSIS — O99824 Streptococcus B carrier state complicating childbirth: Secondary | ICD-10-CM | POA: Diagnosis present

## 2014-09-16 DIAGNOSIS — J45909 Unspecified asthma, uncomplicated: Secondary | ICD-10-CM | POA: Diagnosis present

## 2014-09-16 LAB — CBC
HCT: 36 % (ref 36.0–46.0)
Hemoglobin: 12.2 g/dL (ref 12.0–15.0)
MCH: 29 pg (ref 26.0–34.0)
MCHC: 33.9 g/dL (ref 30.0–36.0)
MCV: 85.7 fL (ref 78.0–100.0)
Platelets: 254 10*3/uL (ref 150–400)
RBC: 4.2 MIL/uL (ref 3.87–5.11)
RDW: 13.4 % (ref 11.5–15.5)
WBC: 8.1 10*3/uL (ref 4.0–10.5)

## 2014-09-16 LAB — TYPE AND SCREEN
ABO/RH(D): O POS
ANTIBODY SCREEN: NEGATIVE

## 2014-09-16 MED ORDER — CITRIC ACID-SODIUM CITRATE 334-500 MG/5ML PO SOLN: 30.0000 mL | ORAL | Status: DC | PRN

## 2014-09-16 MED ORDER — LACTATED RINGERS IV SOLN
INTRAVENOUS | Status: DC
  Administered 2014-09-16 (×2): via INTRAVENOUS

## 2014-09-16 MED ORDER — OXYCODONE-ACETAMINOPHEN 5-325 MG PO TABS: 1.0000 | ORAL_TABLET | ORAL | Status: DC | PRN

## 2014-09-16 MED ORDER — OXYTOCIN 40 UNITS IN LACTATED RINGERS INFUSION - SIMPLE MED
62.5000 mL/h | INTRAVENOUS | Status: DC
  Administered 2014-09-17: 62.5 mL/h via INTRAVENOUS
  Filled 2014-09-16 (×2): qty 1000

## 2014-09-16 MED ORDER — ZOLPIDEM TARTRATE 5 MG PO TABS
5.0000 mg | ORAL_TABLET | Freq: Every evening | ORAL | Status: DC | PRN
  Administered 2014-09-16: 5 mg via ORAL
  Filled 2014-09-16: qty 1

## 2014-09-16 MED ORDER — ACETAMINOPHEN 325 MG PO TABS: 650.0000 mg | ORAL_TABLET | ORAL | Status: DC | PRN

## 2014-09-16 MED ORDER — LIDOCAINE HCL (PF) 1 % IJ SOLN
30.0000 mL | INTRAMUSCULAR | Status: DC | PRN
  Filled 2014-09-16: qty 30

## 2014-09-16 MED ORDER — LACTATED RINGERS IV SOLN: 500.0000 mL | INTRAVENOUS | Status: DC | PRN

## 2014-09-16 MED ORDER — ONDANSETRON HCL 4 MG/2ML IJ SOLN: 4.0000 mg | Freq: Four times a day (QID) | INTRAMUSCULAR | Status: DC | PRN

## 2014-09-16 MED ORDER — OXYCODONE-ACETAMINOPHEN 5-325 MG PO TABS: 2.0000 | ORAL_TABLET | ORAL | Status: DC | PRN

## 2014-09-16 MED ORDER — OXYTOCIN BOLUS FROM INFUSION
500.0000 mL | INTRAVENOUS | Status: DC
  Administered 2014-09-17: 500 mL via INTRAVENOUS
  Administered 2014-09-17: 300 mL via INTRAVENOUS

## 2014-09-16 MED ORDER — NALBUPHINE HCL 10 MG/ML IJ SOLN
10.0000 mg | INTRAMUSCULAR | Status: DC | PRN
  Administered 2014-09-17: 10 mg via INTRAVENOUS
  Filled 2014-09-16: qty 1

## 2014-09-16 MED ORDER — MISOPROSTOL 200 MCG PO TABS
400.0000 ug | ORAL_TABLET | ORAL | Status: DC
  Administered 2014-09-16 (×2): 400 ug via VAGINAL
  Filled 2014-09-16 (×5): qty 2

## 2014-09-16 NOTE — MAU Note (Addendum)
No FH on US today in office. No pain or bleeding.   Was heard with doppler when last here and also 2 wks ago at appt.

## 2014-09-16 NOTE — Progress Notes (Signed)
  Subjective: Sleeping soundly--took Ambien at 2223.  When awakened, denies pain.  Objective: BP 129/66 mmHg  Pulse 87  Temp(Src) 98 F (36.7 C) (Oral)  Resp 22  Ht 5\' 2"  (1.575 m)  Wt 180 lb (81.647 kg)  BMI 32.91 kg/m2  LMP 05/03/2014      UC:   None SVE:  FT, soft, thick, small amount bloody mucus 2nd dose Cytotech 400 mcg placed in posterior fornix.  Assessment:  Induction for IUFD at 19 3/7 weeks  Plan: Continue current care. Pain med prn.  Nigel BridgemanLATHAM, Kristin Lyons CNM 09/16/2014, 11:23 PM

## 2014-09-16 NOTE — MAU Note (Signed)
Upset; had OB appointment today and unable to hear FHR; to have u/s to check for FHR;

## 2014-09-16 NOTE — H&P (Signed)
Kristin Lyons is a 24 y.o. female, G3P1011 at 19.3 weeks, presenting for a repeat US, as a 2nd opinion, to confirm IUFD.  Patient was seen in the office, this morning, for her anatomy scan and fetus without a heartbeat.  Patient requests IOL for tonight, but also wanted repeat US as dopplers confirmed heart rate 2 weeks ago.   Patient Active Problem List   Diagnosis Date Noted  . Normal pregnancy 07/28/2011  . SVD (spontaneous vaginal delivery) 07/28/2011  . 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 Late prenatal care 07/28/2011  . Asthma 07/28/2011    History of present pregnancy: Patient entered care at 17.1 weeks.   EDC of 02/07/2015 was established by Definite LMP of 05/03/2014.   Anatomy scan:  19 weeks, with confirmed IUFD Additional US evaluations:  -BS US in MAU 09/16/2014:  Significant prenatal events: Above stated   Last evaluation:  09/16/2014 by Dr. AVS  OB History    Gravida Para Term Preterm AB TAB SAB Ectopic Multiple Living   3 1 1  0 1 0 1 0 0 1     Past Medical History  Diagnosis Date  . Asthma   . Asthma   . History of chicken pox   . Yeast infection   . Bacterial infection   . Trichomonas    Past Surgical History  Procedure Laterality Date  . Dilation and curettage of uterus     Family History: family history includes Hypertension in her maternal uncle and paternal uncle. Social History:  reports that she has never smoked. She has never used smokeless tobacco. She reports that she does not drink alcohol or use illicit drugs.   Prenatal Transfer Tool  Maternal Diabetes: No Genetic Screening: In Process Maternal Ultrasounds/Referrals: Abnormal:  Findings:   Other: IUFD Fetal Ultrasounds or other Referrals:  None Maternal Substance Abuse:  No Significant Maternal Medications:  None Significant Maternal Lab Results: None    ROS:  -Ctx, -LOF, -VB  Allergies  Allergen Reactions  . Shellfish Allergy Hives  . Pollen Extract Other (See Comments)    Feels like  she has a cold       Blood pressure 135/62, pulse 99, temperature 99.5 F (37.5 C), temperature source Oral, resp. rate 16, last menstrual period 05/03/2014.  Physical Exam General appearance - oriented to person, place, and time and normal appearing weight Mental Status - alert, oriented to person, place, and time, affect appropriate to mood Eyes - left eye normal, arcus senilis noted  Chest - clear to auscultation, no wheezes, rales or rhonchi, symmetric air entry  Heart - normal rate and regular rhythm  Abdomen - bowel sounds normal, Soft, NT  Musculoskeletal - full range of motion without pain  Extremities - no pedal edema noted  Skin - Warm, Dry  Prenatal labs: ABO, Rh:  O Positive Antibody:  Negative Rubella:   Immune RPR: NON REAC (12/12 1532)  HBsAg:   Negative HIV: NONREACTIVE (12/12 1532)  GBS:  Unknown Sickle cell/Hgb electrophoresis:  Normal Pap:  Negative GC:  Negative Chlamydia:  Negative Genetic screenings:  Pending Glucola:  Not performed Other:  None    Assessment IUP at 19.3wks IUFD IOL  [facility] Dr[facility] Audree CamelN. Dillard [facility] Rout[facility] Labor and Delivery Orders per [facility] Protocol Induction of Labor for IUFD Okay for regular diet prior to induction process Questions and concerns addressed  Kristin Lyons Kristin FifeLYNN, MSN, CNM 09/16/2014, 4:38 PM

## 2014-09-16 NOTE — Plan of Care (Signed)
Problem: Consults Goal: Birthing Suites Patient Information Press F2 to bring up selections list  Outcome: Completed/Met Date Met:  09/16/14  Pt < [redacted] weeks EGA and IUFD (Intrauterine fetal demise)

## 2014-09-16 NOTE — Progress Notes (Signed)
  Subjective: Patient ate light meal, now ready for initial dose of Cytotech.  Family here, but likely will not have any family here during labor process.  Patient coping well with loss--this pregnancy was unplanned.  Objective: BP 133/83 mmHg  Pulse 81  Temp(Src) 98.6 F (37 C) (Oral)  Resp 18  Ht 5\' 2"  (1.575 m)  Wt 180 lb (81.647 kg)  BMI 32.91 kg/m2  LMP 05/03/2014     UC:   none SVE:  FT, long, soft 400 mcg Cytotech placed in posterior fornix  Assessment:  IUFD at 19 3/7 weeks  Plan: Reviewed plan of care with patient, including use of Cytotech, options for pain management, process of L&D in IUFD, possible complications of bleeding or retained placenta, possible need for D&E.  Patient seems to understand these issues and is ready to proceed. She hopes to go home as soon as possible after delivery--issues reviewed, usual care discussed. Support to patient for loss. Dr. Charlotta Newtonzan updated. Repeat 400 mcg Cytotech q 4 hours prn.  Kristin BridgemanLATHAM, Kristin Lyons CNM 09/16/2014, 7:45 PM

## 2014-09-17 ENCOUNTER — Encounter (HOSPITAL_COMMUNITY): Payer: Self-pay | Admitting: Anesthesiology

## 2014-09-17 ENCOUNTER — Inpatient Hospital Stay (HOSPITAL_COMMUNITY): Payer: Medicaid Other | Admitting: Anesthesiology

## 2014-09-17 ENCOUNTER — Encounter (HOSPITAL_COMMUNITY)
Admission: AD | Disposition: A | Discharge status: Home / Self Care | Payer: Self-pay | Attending: Obstetrics and Gynecology

## 2014-09-17 HISTORY — PX: DILATION AND CURETTAGE OF UTERUS: SHX78

## 2014-09-17 HISTORY — PX: DILATION AND EVACUATION: SHX1459

## 2014-09-17 LAB — SAVE SMEAR

## 2014-09-17 LAB — CBC
HEMATOCRIT: 34 % — AB (ref 36.0–46.0)
Hemoglobin: 11.6 g/dL — ABNORMAL LOW (ref 12.0–15.0)
MCH: 29.2 pg (ref 26.0–34.0)
MCHC: 34.1 g/dL (ref 30.0–36.0)
MCV: 85.6 fL (ref 78.0–100.0)
Platelets: 210 10*3/uL (ref 150–400)
RBC: 3.97 MIL/uL (ref 3.87–5.11)
RDW: 13.2 % (ref 11.5–15.5)
WBC: 9.3 10*3/uL (ref 4.0–10.5)

## 2014-09-17 LAB — APTT: aPTT: 31 seconds (ref 24–37)

## 2014-09-17 LAB — PROTIME-INR
INR: 1.01 (ref 0.00–1.49)
PROTHROMBIN TIME: 13.4 s (ref 11.6–15.2)

## 2014-09-17 LAB — FIBRINOGEN: Fibrinogen: 516 mg/dL — ABNORMAL HIGH (ref 204–475)

## 2014-09-17 SURGERY — DILATION AND EVACUATION, UTERUS
Anesthesia: Spinal | Site: Vagina

## 2014-09-17 MED ORDER — TETANUS-DIPHTH-ACELL PERTUSSIS 5-2.5-18.5 LF-MCG/0.5 IM SUSP: 0.5000 mL | Freq: Once | INTRAMUSCULAR | Status: DC

## 2014-09-17 MED ORDER — SODIUM CHLORIDE 0.9 % IJ SOLN: 3.0000 mL | Freq: Two times a day (BID) | INTRAMUSCULAR | Status: DC

## 2014-09-17 MED ORDER — BENZOCAINE-MENTHOL 20-0.5 % EX AERO: 1.0000 "application " | INHALATION_SPRAY | CUTANEOUS | Status: DC | PRN

## 2014-09-17 MED ORDER — MISOPROSTOL 200 MCG PO TABS
400.0000 ug | ORAL_TABLET | Freq: Once | ORAL | Status: AC
  Administered 2014-09-17: 400 ug via RECTAL

## 2014-09-17 MED ORDER — LIDOCAINE HCL 2 % IJ SOLN
INTRAMUSCULAR | Status: AC
  Filled 2014-09-17: qty 20

## 2014-09-17 MED ORDER — DIBUCAINE 1 % RE OINT: 1.0000 "application " | TOPICAL_OINTMENT | RECTAL | Status: DC | PRN

## 2014-09-17 MED ORDER — FENTANYL CITRATE 0.05 MG/ML IJ SOLN
INTRAMUSCULAR | Status: AC
  Filled 2014-09-17: qty 2

## 2014-09-17 MED ORDER — LACTATED RINGERS IV SOLN
INTRAVENOUS | Status: DC | PRN
  Administered 2014-09-17: 09:00:00 via INTRAVENOUS

## 2014-09-17 MED ORDER — DIPHENHYDRAMINE HCL 25 MG PO CAPS: 25.0000 mg | ORAL_CAPSULE | Freq: Four times a day (QID) | ORAL | Status: DC | PRN

## 2014-09-17 MED ORDER — LANOLIN HYDROUS EX OINT: TOPICAL_OINTMENT | CUTANEOUS | Status: DC | PRN

## 2014-09-17 MED ORDER — IBUPROFEN 600 MG PO TABS: 600.0000 mg | ORAL_TABLET | Freq: Four times a day (QID) | ORAL | Status: DC

## 2014-09-17 MED ORDER — OXYCODONE-ACETAMINOPHEN 5-325 MG PO TABS: 1.0000 | ORAL_TABLET | Freq: Four times a day (QID) | ORAL | Status: DC | PRN

## 2014-09-17 MED ORDER — SODIUM CHLORIDE 0.9 % IJ SOLN: 3.0000 mL | INTRAMUSCULAR | Status: DC | PRN

## 2014-09-17 MED ORDER — CEFAZOLIN SODIUM-DEXTROSE 2-3 GM-% IV SOLR
2.0000 g | Freq: Three times a day (TID) | INTRAVENOUS | Status: DC
  Administered 2014-09-17: 2 g via INTRAVENOUS
  Filled 2014-09-17 (×2): qty 50

## 2014-09-17 MED ORDER — WITCH HAZEL-GLYCERIN EX PADS: 1.0000 "application " | MEDICATED_PAD | CUTANEOUS | Status: DC | PRN

## 2014-09-17 MED ORDER — BUPIVACAINE IN DEXTROSE 0.75-8.25 % IT SOLN
INTRATHECAL | Status: DC | PRN
  Administered 2014-09-17: 1.2 mL via INTRATHECAL

## 2014-09-17 MED ORDER — ZOLPIDEM TARTRATE 5 MG PO TABS: 5.0000 mg | ORAL_TABLET | Freq: Every evening | ORAL | Status: DC | PRN

## 2014-09-17 MED ORDER — SENNOSIDES-DOCUSATE SODIUM 8.6-50 MG PO TABS
2.0000 | ORAL_TABLET | ORAL | Status: DC
Start: 2014-09-18 — End: 2014-09-17

## 2014-09-17 MED ORDER — MIDAZOLAM HCL 2 MG/2ML IJ SOLN
INTRAMUSCULAR | Status: AC
  Filled 2014-09-17: qty 2

## 2014-09-17 MED ORDER — CEFAZOLIN SODIUM-DEXTROSE 1-4 GM-% IV SOLR: 2.0000 g | Freq: Once | INTRAVENOUS | Status: DC

## 2014-09-17 MED ORDER — ONDANSETRON HCL 4 MG/2ML IJ SOLN
INTRAMUSCULAR | Status: AC
  Filled 2014-09-17: qty 2

## 2014-09-17 MED ORDER — METOCLOPRAMIDE HCL 5 MG/ML IJ SOLN
10.0000 mg | Freq: Once | INTRAMUSCULAR | Status: DC | PRN
Start: 2014-09-17 — End: 2014-09-17

## 2014-09-17 MED ORDER — PRENATAL MULTIVITAMIN CH
1.0000 | ORAL_TABLET | Freq: Every day | ORAL | Status: DC
  Administered 2014-09-17: 1 via ORAL
  Filled 2014-09-17: qty 1

## 2014-09-17 MED ORDER — OXYCODONE-ACETAMINOPHEN 5-325 MG PO TABS: 1.0000 | ORAL_TABLET | ORAL | Status: DC | PRN

## 2014-09-17 MED ORDER — IBUPROFEN 600 MG PO TABS: 600.0000 mg | ORAL_TABLET | Freq: Four times a day (QID) | ORAL | Status: DC | PRN

## 2014-09-17 MED ORDER — ONDANSETRON HCL 4 MG PO TABS: 4.0000 mg | ORAL_TABLET | ORAL | Status: DC | PRN

## 2014-09-17 MED ORDER — FENTANYL CITRATE 0.05 MG/ML IJ SOLN
INTRAMUSCULAR | Status: DC | PRN
  Administered 2014-09-17 (×2): 50 ug via INTRAVENOUS

## 2014-09-17 MED ORDER — SIMETHICONE 80 MG PO CHEW: 80.0000 mg | CHEWABLE_TABLET | ORAL | Status: DC | PRN

## 2014-09-17 MED ORDER — MEDROXYPROGESTERONE ACETATE 150 MG/ML IM SUSP
150.0000 mg | Freq: Once | INTRAMUSCULAR | Status: AC
  Administered 2014-09-17: 150 mg via INTRAMUSCULAR
  Filled 2014-09-17: qty 1

## 2014-09-17 MED ORDER — MEPERIDINE HCL 25 MG/ML IJ SOLN: 6.2500 mg | INTRAMUSCULAR | Status: DC | PRN

## 2014-09-17 MED ORDER — ONDANSETRON HCL 4 MG/2ML IJ SOLN: 4.0000 mg | INTRAMUSCULAR | Status: DC | PRN

## 2014-09-17 MED ORDER — OXYTOCIN 40 UNITS IN LACTATED RINGERS INFUSION - SIMPLE MED: 62.5000 mL/h | INTRAVENOUS | Status: DC | PRN

## 2014-09-17 MED ORDER — LACTATED RINGERS IV SOLN
40.0000 [IU] | INTRAVENOUS | Status: DC | PRN
  Administered 2014-09-17: 40 [IU] via INTRAVENOUS

## 2014-09-17 MED ORDER — LIDOCAINE HCL 2 % IJ SOLN
INTRAMUSCULAR | Status: DC | PRN
  Administered 2014-09-17: 20 mL

## 2014-09-17 MED ORDER — ONDANSETRON HCL 4 MG/2ML IJ SOLN
INTRAMUSCULAR | Status: DC | PRN
Start: 2014-09-17 — End: 2014-09-17
  Administered 2014-09-17: 4 mg via INTRAVENOUS

## 2014-09-17 MED ORDER — FENTANYL CITRATE 0.05 MG/ML IJ SOLN
100.0000 ug | INTRAMUSCULAR | Status: DC | PRN
  Administered 2014-09-17 (×2): 100 ug via INTRAVENOUS
  Filled 2014-09-17: qty 2

## 2014-09-17 MED ORDER — SODIUM CHLORIDE 0.9 % IV SOLN: 250.0000 mL | INTRAVENOUS | Status: DC | PRN

## 2014-09-17 MED ORDER — OXYCODONE-ACETAMINOPHEN 5-325 MG PO TABS: 2.0000 | ORAL_TABLET | ORAL | Status: DC | PRN

## 2014-09-17 MED ORDER — MIDAZOLAM HCL 5 MG/5ML IJ SOLN
INTRAMUSCULAR | Status: DC | PRN
  Administered 2014-09-17 (×2): 1 mg via INTRAVENOUS

## 2014-09-17 MED ORDER — FENTANYL CITRATE 0.05 MG/ML IJ SOLN: 25.0000 ug | INTRAMUSCULAR | Status: DC | PRN

## 2014-09-17 MED ORDER — MISOPROSTOL 200 MCG PO TABS
800.0000 ug | ORAL_TABLET | Freq: Once | ORAL | Status: AC
  Administered 2014-09-17: 800 ug via VAGINAL

## 2014-09-17 SURGICAL SUPPLY — 18 items
CATH ROBINSON RED A/P 16FR (CATHETERS) ×3 IMPLANT
CLOTH BEACON ORANGE TIMEOUT ST (SAFETY) ×3 IMPLANT
DECANTER SPIKE VIAL GLASS SM (MISCELLANEOUS) ×3 IMPLANT
GLOVE BIO SURGEON STRL SZ7.5 (GLOVE) ×3 IMPLANT
GLOVE BIOGEL PI IND STRL 7.5 (GLOVE) ×2 IMPLANT
GLOVE BIOGEL PI INDICATOR 7.5 (GLOVE) ×4
GOWN STRL REUS W/TWL LRG LVL3 (GOWN DISPOSABLE) ×6 IMPLANT
KIT BERKELEY 1ST TRIMESTER 3/8 (MISCELLANEOUS) ×3 IMPLANT
NS IRRIG 1000ML POUR BTL (IV SOLUTION) ×3 IMPLANT
PACK VAGINAL MINOR WOMEN LF (CUSTOM PROCEDURE TRAY) ×3 IMPLANT
PAD OB MATERNITY 4.3X12.25 (PERSONAL CARE ITEMS) ×3 IMPLANT
PAD PREP 24X48 CUFFED NSTRL (MISCELLANEOUS) ×3 IMPLANT
SET BERKELEY SUCTION TUBING (SUCTIONS) ×3 IMPLANT
TOWEL OR 17X24 6PK STRL BLUE (TOWEL DISPOSABLE) ×6 IMPLANT
VACURETTE 10 RIGID CVD (CANNULA) IMPLANT
VACURETTE 7MM CVD STRL WRAP (CANNULA) IMPLANT
VACURETTE 8 RIGID CVD (CANNULA) ×2 IMPLANT
VACURETTE 9 RIGID CVD (CANNULA) IMPLANT

## 2014-09-17 NOTE — Anesthesia Preprocedure Evaluation (Addendum)
Anesthesia Evaluation  Patient identified by MRN, date of birth, ID band Patient awake    Reviewed: Allergy & Precautions, NPO status , Patient's Chart, lab work & pertinent test results  Airway Mallampati: III  TM Distance: >3 FB Neck ROM: Full    Dental no notable dental hx. (+) Teeth Intact   Pulmonary asthma ,  breath sounds clear to auscultation  Pulmonary exam normal       Cardiovascular negative cardio ROS  Rhythm:Regular Rate:Normal     Neuro/Psych negative neurological ROS  negative psych ROS   GI/Hepatic negative GI ROS, Neg liver ROS,   Endo/Other  Obesity  Renal/GU negative Renal ROS  negative genitourinary   Musculoskeletal negative musculoskeletal ROS (+)   Abdominal (+) + obese,   Peds  Hematology negative hematology ROS (+)   Anesthesia Other Findings   Reproductive/Obstetrics Retained placenta From IUFD  19 4/7 weeks  Vaginal delivery last night                           Anesthesia Physical Anesthesia Plan  ASA: II and emergent  Anesthesia Plan: Spinal   Post-op Pain Management:    Induction:   Airway Management Planned: Natural Airway  Additional Equipment:   Intra-op Plan:   Post-operative Plan:   Informed Consent: I have reviewed the patients History and Physical, chart, labs and discussed the procedure including the risks, benefits and alternatives for the proposed anesthesia with the patient or authorized representative who has indicated his/her understanding and acceptance.     Plan Discussed with: Anesthesiologist, CRNA and Surgeon  Anesthesia Plan Comments:        Anesthesia Quick Evaluation

## 2014-09-17 NOTE — Progress Notes (Addendum)
Kristin Lyons MRN: [id number]  Subjective: -Patient reports minimal cramping and discomfort.  Ate dinner last night and has had nothing since but small sips of water/juice.  Denies N/V.   Objective: BP 119/50 mmHg  Pulse 86  Temp(Src) 98 F (36.7 C) (Oral)  Resp 16  Ht 5\' 2"  (1.575 m)  Wt 180 lb (81.647 kg)  BMI 32.91 kg/m2  LMP 05/03/2014     -Pitocin Bolus infusing -Bleeding small to scant -Hemostat clamped on part of the cord and rests inside the introitus  Assessment:  S/P SVD of NV Female Retained Placenta Hemodynamically Stable  Plan: -Gentle traction applied while patient prompted to push--Placenta remains attached -Dr. Lance MorinA. Durenda Pechacek updated on patient status -Requests patient be NPO in preparation for DC procedure this am -Discussed POC with patient, who agrees and reports history of DC  -Continue other mgmt as ordered  EMLY, [name],MSN, CNM 09/17/2014, 7:33 AM   I saw patient and discussed retained placenta.  I reviewed options r/b/a and pt is agreeable to proceed with suction D&C/possible manual removal.  Will give a dose of ancef as well.  Questions answered and consent s/w.

## 2014-09-17 NOTE — Progress Notes (Signed)
   09/17/14 1100  Clinical Encounter Type  Visited With Patient  Visit Type Spiritual support;Social support  Referral From Chaplain  Spiritual Encounters  Spiritual Needs Grief support;Emotional  Stress Factors  Patient Stress Factors Loss;Loss of control   Checked on Kristin Lyons on [facility] to offer spiritual and emotional support.  At this time she desires space to herself.  Plan to f/u this afternoon.  Please also page as needs arise.  Thank you.  [address]Chaplain Lilliane Sposito [place], [place] [phone]

## 2014-09-17 NOTE — Progress Notes (Signed)
Kristin Lyons  Post Partum Day 0:S/P SVD of Non-Viable Female 5 Hrs S/P D&C   Subjective: Patient up ad lib, denies syncope or dizziness. Reports consuming regular diet without issues and denies N/V. Reports no urination or ambulation since procedure. States bleeding is "alright."  Desires depo for postpartum contraception and requests administration prior to discharge.  Pain is being appropriately managed without use of medications.  Objective: Filed Vitals:   09/17/14 1045 09/17/14 1100 09/17/14 1118 09/17/14 1220  BP: 126/92 119/80 114/93 115/72  Pulse: 74 75 82 89  Temp:   97.8 F (36.6 C) 98.1 F (36.7 C)  TempSrc:   Oral Oral  Resp: 17 17 18 18   Height:      Weight:      SpO2: 100% 100% 100% 99%    Recent Labs  09/16/14 1640  HGB 12.2  HCT 36.0    Physical Exam:  General: alert, cooperative and no distress Mood/Affect: Appropriate/Flat Lungs: clear to auscultation, no wheezes, rales or rhonchi, symmetric air entry.  Heart: normal rate and regular rhythm. Abdomen:  + bowel sounds, Soft, NT Uterine Fundus: Unable to palpate Lochia: appropriate Laceration: None Skin: Warm, Dry. DVT Evaluation: No evidence of DVT seen on physical exam. No significant calf/ankle edema.  Assessment S/P SVD of NV Female S/P D&C Hemodynamically stable  Plan: Instructed to get OOB, ambulate, and urinate Will review labs and consider discharge based on findings Patient requests and will receive depo shot prior to discharge Continue current care as ordered Will reassess in 2 hours Dr. Lance MorinA. Roberts to be updated on patient status   Kristin Lyons, Kristin FasterJESSICA LYNN, MSN, CNM 09/17/2014, 2:01 PM

## 2014-09-17 NOTE — Discharge Summary (Signed)
Physician Discharge Summary  Patient ID: Kristin MottShanice D Sokol MRN: [id number] DOB/AGE: Apr 07, 1991 23 y.o.  Admit date: 09/16/2014 Discharge date: 09/17/2014  Admission Diagnoses: IUFD at 19wks  Discharge Diagnoses:  Active Problems:   IUFD (intrauterine fetal death)   Discharged Condition: good  Hospital Course: Pt was admitted and induced and delivered a nonviable fetus without difficulty.  The placenta did not deliver spontaneously.  The patient was taken to OR and suction D&C performed.    Consults: None  Significant Diagnostic Studies: none  Treatments: IV hydration, antibiotics: Ancef and surgery: Suction D&C for retained placenta  Discharge Exam: Blood pressure 122/50, pulse 84, temperature 99.8 F (37.7 C), temperature source Oral, resp. rate 18, height 5\' 2"  (1.575 m), weight 81.647 kg (180 lb), last menstrual period 05/03/2014, SpO2 99 %. General appearance: alert and no distress Resp: clear to auscultation bilaterally Cardio: regular rate and rhythm Extremities: no calf tenderness minimal vaginal bleeding  Disposition: 01-Home or Self Care  Discharge Instructions    Diet - low sodium heart healthy    Complete by:  As directed      Discharge instructions    Complete by:  As directed   [facility] Handout            Medication List    TAKE these medications        acetaminophen 500 MG tablet  Commonly known as:  TYLENOL  Take 500 mg by mouth every 6 (six) hours as needed for mild pain.     ibuprofen 600 MG tablet  Commonly known as:  ADVIL,MOTRIN  Take 1 tablet (600 mg total) by mouth every 6 (six) hours as needed for mild pain, moderate pain or cramping.     oxyCODONE-acetaminophen 5-325 MG per tablet  Commonly known as:  PERCOCET/ROXICET  Take 1-2 tablets by mouth every 6 (six) hours as needed for moderate pain or severe pain.     prenatal multivitamin Tabs tablet  Take 1 tablet by mouth daily at 12 noon.     promethazine 25 MG tablet  Commonly known  as:  PHENERGAN  Take 0.5-1 tablets (12.5-25 mg total) by mouth every 6 (six) hours as needed.           Follow-up Information    [facility]. Schedule an appointment as soon as possible for a visit in 2 weeks.   Specialty:  Obstetrics and Gynecology   Why:  Please call if you have any questions or concerns prior to your next visit.   Contact information:   [address] [place] [phone]      Follow up with Purcell NailsOBERTS,Arbor Cohen Y, MD In 2 weeks.   Specialty:  Obstetrics and Gynecology   Why:  for pp and postop appt   Contact information:   [address] [place] [id number] [phone]       Signed: Purcell NailsROBERTS,Tiffanye Hartmann Y 09/17/2014, 5:34 PM

## 2014-09-17 NOTE — Anesthesia Procedure Notes (Signed)
Spinal Patient location during procedure: OR Start time: 09/17/2014 9:25 AM Staffing Anesthesiologist: Burnett Lieber A. Performed by: anesthesiologist  Preanesthetic Checklist Completed: patient identified, site marked, surgical consent, pre-op evaluation, timeout performed, IV checked, risks and benefits discussed and monitors and equipment checked Spinal Block Patient position: right lateral decubitus Prep: site prepped and draped and DuraPrep Patient monitoring: heart rate, cardiac monitor, continuous pulse ox and blood pressure Approach: midline Location: L4-5 Injection technique: single-shot Needle Needle type: Sprotte  Needle gauge: 24 G Needle length: 9 cm Needle insertion depth: 5 cm Assessment Sensory level: T4 Additional Notes Patient tolerated procedure well. Adequate sensory level.

## 2014-09-17 NOTE — Progress Notes (Addendum)
Updated Dr. Elta Guadeloupezan--order for Cytotech 400 mcg now. She will update Dr. Su Hiltoberts as on-coming MD.  Cytotech 400 mcg placed in rectum. No extrusion of placenta with gentle traction and maternal expulsive efforts. Minimal bleeding, no cramping. Filed Vitals:   09/17/14 0410 09/17/14 0448 09/17/14 0530 09/17/14 0600  BP: 107/58 122/51 113/59 112/54  Pulse: 91 97 84 81  Temp:  98.1 F (36.7 C)    TempSrc:      Resp: 16 16 16 16   Height:      Weight:       Will continue current plan of care. Support to patient for loss and delay of placental delivery.  Nigel BridgemanVicki Shermeka Rutt, CNM 09/17/14 [phone]

## 2014-09-17 NOTE — Progress Notes (Signed)
  Subjective: Cramping now, feeling some pressure in vagina.  Received Nubain 10 mg at 0052 with minimal benefit per patient.  Objective: BP 110/51 mmHg  Pulse 98  Temp(Src) 98.6 F (37 C) (Oral)  Resp 16  Ht 5\' 2"  (1.575 m)  Wt 180 lb (81.647 kg)  BMI 32.91 kg/m2  LMP 05/03/2014      SVE:   6 cm, 100%, BBOW  Assessment:  Induction for IUFD at 19 4/7 weeks Progressive labor  Plan: Fentanyl 100 mcg Anticipate delivery soon.  Nigel BridgemanLATHAM, Jamile Sivils CNM 09/17/2014, 1:17 AM

## 2014-09-17 NOTE — Plan of Care (Signed)
Problem: Phase I Progression Outcomes Goal: Other Phase I Outcomes/Goals Outcome: Completed/Met Date Met:  09/17/14 IUFD comfort care and education, checklist completed

## 2014-09-17 NOTE — Anesthesia Postprocedure Evaluation (Signed)
  Anesthesia Post-op Note  Patient: Kristin Lyons  Procedure(s) Performed: Procedure(s): DILATATION AND EVACUATION (N/A) DILATATION AND CURETTAGE (N/A)  Patient Location: PACU  Anesthesia Type:Spinal  Level of Consciousness: awake, alert  and oriented  Airway and Oxygen Therapy: Patient Spontanous Breathing  Post-op Pain: none  Post-op Assessment: Post-op Vital signs reviewed, Patient's Cardiovascular Status Stable, Respiratory Function Stable, Patent Airway, No signs of Nausea or vomiting, Pain level controlled, No headache, No backache, No residual numbness and No residual motor weakness  Post-op Vital Signs: Reviewed and stable  Last Vitals:  Filed Vitals:   09/17/14 1045  BP: 126/92  Pulse: 74  Temp:   Resp: 17    Complications: No apparent anesthesia complications

## 2014-09-17 NOTE — Progress Notes (Signed)
   09/17/14 1700  Clinical Encounter Type  Visited With Patient  Visit Type Follow-up  Spiritual Encounters  Spiritual Needs Grief support;Emotional  Stress Factors  Patient Stress Factors Loss   Followed up for further support. Though Kristin describes herself as a private person who doesn't like to talk much about feelings, she shared some of her story and processing. She reports support from mom and sisters. Per pt, her son (3) has not seemed very attached to baby, so she is not worried about his adjustment. She is looking forward to getting home.  Provided pastoral presence, reflective listening, bereavement support, and a blank handmade thank-you card because pt wanted to write a note to a provider.  [address]Chaplain Alyss Granato [place]ons, [place] [phone]

## 2014-09-17 NOTE — Transfer of Care (Signed)
Immediate Anesthesia Transfer of Care Note  Patient: Kristin Lyons  Procedure(s) Performed: Procedure(s): DILATATION AND EVACUATION (N/A) DILATATION AND CURETTAGE (N/A)  Patient Location: PACU  Anesthesia Type:Spinal  Level of Consciousness: awake, alert  and oriented  Airway & Oxygen Therapy: Patient Spontanous Breathing  Post-op Assessment: Report given to RN and Post -op Vital signs reviewed and stable  Post vital signs: Reviewed and stable  Last Vitals:  Filed Vitals:   09/17/14 1045  BP: 126/92  Pulse: 74  Temp:   Resp: 17    Complications: No apparent anesthesia complications

## 2014-09-17 NOTE — Progress Notes (Signed)
Voice message left for Social Worker on patient & delivery status. Chaplain was also notified of patient & delivery status. At this time patient is not interested in talking to anyone. Chaplain will come if patient changes their mind, or will see in AM. Will update as needed.

## 2014-09-17 NOTE — Progress Notes (Signed)
Pt discharged to home with mother.  Condition stable.  Pt to car via wheelchair with RN.  No equipment for home ordered at discharge.

## 2014-09-17 NOTE — Discharge Instructions (Signed)
Vaginal Delivery, Care After Refer to this sheet in the next few weeks. These instructions provide you with information on caring for yourself after your delivery. Your health care provider may also give you more specific instructions. Your treatment has been planned according to current medical practices, but problems sometimes occur. Call your health care provider if you have any problems or questions after your delivery. WHAT TO EXPECT AFTER YOUR DELIVERY After your delivery, it is typical to have the following:   You may feel pain in the vaginal area for several days after delivery. If you had an incision or a vaginal tear, the area will probably continue to be tender to the touch for several weeks.  You may feel very fatigued after a vaginal delivery.  You may have vaginal bleeding and discharge that will start out red, then become pink, then yellow, then white. Altogether, this usually lasts for about 6 weeks.  The combination of having lost your baby and changing hormones from the delivery can make you feel very sad. You may also experience emotions that change very quickly. Some of the emotions people often notice after loss include:  Anger.  Denial.  Guilt.  Sorrow.  Depression.  Grief.  Relationship problems. HOME CARE INSTRUCTIONS   Consider seeking support for your loss. Some forms of support that you might consider include your religious leader, friends, family, a Medical illustrator, or a bereavement support group.  Take medicines only as directed by your health care provider.  Continue to use good perineal care. Good perineal care includes:   Wiping your perineum from front to back.   Keeping your perineum clean.   Do not use tampons or douche until your health care provider says it is okay.   Shower, wash your hair, and take tub baths as directed by your health care provider.   Wear a well-fitting bra that provides breast support.   Drink enough  fluids to keep your urine clear or pale yellow.   Eat healthy foods.   Eat high-fiber foods every day, such as whole grain cereals and breads, brown rice, beans, and fresh fruits and vegetables. These foods may help prevent or relieve constipation.   Follow your health care provider's directions about resuming activities such as climbing stairs, driving, lifting, exercising, or traveling.   Increase your activities gradually.   Talk to your health care provider about resuming sexual activities. This depends on your risk of infection, your rate of healing, and your comfort and desire to resume sexual activity.   Try to have someone help you with your household activities for at least a few days after you leave the hospital.   Rest as much as possible.  Keep all of your scheduled postpartum appointments. It is very important to keep your scheduled follow-up appointments. At these appointments, your health care provider will be checking to make sure that you are healing physically and emotionally.   Do not drink alcohol, especially if you are taking medicine to relieve pain.   Do not use any tobacco products including cigarettes, chewing tobacco, or electronic cigarettes. If you need help quitting, ask your health care provider.  Do not use illegal drugs.  SEEK MEDICAL CARE IF:   You feel sad or depressed.   You have thoughts of hurting yourself.  You are having trouble eating or sleeping.  You cannot enjoy the things in life you have previously enjoyed.  You are passing large clots from your vagina. Save any clots to  show your health care provider.   You have a bad smelling discharge from your vagina.   You have trouble urinating.   You are urinating frequently.   You have pain when you urinate.   You have a change in your bowel movements.   You have increasing redness, pain, or swelling near your incision or vaginal tear.   You have pus draining from  your incision or vaginal tear.   Your incision or vaginal tear is separating.   You have painful, hard, or reddened breasts.   You have a severe headache.   You have blurred vision or see spots.   You are dizzy or light-headed.   You have a rash.   You have nausea or vomiting.   You have not had a menstrual period by the 12th week after delivery.   You have a fever.  SEEK IMMEDIATE MEDICAL CARE IF:   You are concerned that you may hurt yourself or you are considering suicide.   You have persistent pain.   You have chest pain.   You have shortness of breath.   You faint.   You have leg pain.   You have stomach pain.   Your vaginal bleeding saturates two or more sanitary pads in 1 hour.  MAKE SURE YOU:  Understand these instructions.   Will watch your condition.   Will get help right away if you are not doing well or get worse.  Document Released: 11/30/2013 Document Reviewed: 07/21/2013 [facility] Patient Information 2015 [place], [place]. This information is not intended to replace advice given to you by your health care provider. Make sure you discuss any questions you have with your health care provider. Medroxyprogesterone injection [Contraceptive] What is this medicine? MEDROXYPROGESTERONE (me DROX ee proe JES te rone) contraceptive injections prevent pregnancy. They provide effective birth control for 3 months. Depo-subQ Provera 104 is also used for treating pain related to endometriosis. This medicine may be used for other purposes; ask your health care provider or pharmacist if you have questions. COMMON BRAND NAME(S): Depo-Provera, Depo-subQ Provera 104 What should I tell my health care provider before I take this medicine? They need to know if you have any of these conditions: -frequently drink alcohol -asthma -blood vessel disease or a history of a blood clot in the lungs or legs -bone disease such as osteoporosis -breast  cancer -diabetes -eating disorder (anorexia nervosa or bulimia) -high blood pressure -HIV infection or AIDS -kidney disease -liver disease -mental depression -migraine -seizures (convulsions) -stroke -tobacco smoker -vaginal bleeding -an unusual or allergic reaction to medroxyprogesterone, other hormones, medicines, foods, dyes, or preservatives -pregnant or trying to get pregnant -breast-feeding How should I use this medicine? Depo-Provera Contraceptive injection is given into a muscle. Depo-subQ Provera 104 injection is given under the skin. These injections are given by a health care professional. You must not be pregnant before getting an injection. The injection is usually given during the first 5 days after the start of a menstrual period or 6 weeks after delivery of a baby. Talk to your pediatrician regarding the use of this medicine in children. Special care may be needed. These injections have been used in female children who have started having menstrual periods. Overdosage: If you think you have taken too much of this medicine contact a poison control center or emergency room at once. NOTE: This medicine is only for you. Do not share this medicine with others. What if I miss a dose? Try not to miss a  dose. You must get an injection once every 3 months to maintain birth control. If you cannot keep an appointment, call and reschedule it. If you wait longer than 13 weeks between Depo-Provera contraceptive injections or longer than 14 weeks between Depo-subQ Provera 104 injections, you could get pregnant. Use another method for birth control if you miss your appointment. You may also need a pregnancy test before receiving another injection. What may interact with this medicine? Do not take this medicine with any of the following medications: -bosentan This medicine may also interact with the following medications: -aminoglutethimide -antibiotics or medicines for infections,  especially rifampin, rifabutin, rifapentine, and griseofulvin -aprepitant -barbiturate medicines such as phenobarbital or primidone -bexarotene -carbamazepine -medicines for seizures like ethotoin, felbamate, oxcarbazepine, phenytoin, topiramate -modafinil -St. John's wort This list may not describe all possible interactions. Give your health care provider a list of all the medicines, herbs, non-prescription drugs, or dietary supplements you use. Also tell them if you smoke, drink alcohol, or use illegal drugs. Some items may interact with your medicine. What should I watch for while using this medicine? This drug does not protect you against HIV infection (AIDS) or other sexually transmitted diseases. Use of this product may cause you to lose calcium from your bones. Loss of calcium may cause weak bones (osteoporosis). Only use this product for more than 2 years if other forms of birth control are not right for you. The longer you use this product for birth control the more likely you will be at risk for weak bones. Ask your health care professional how you can keep strong bones. You may have a change in bleeding pattern or irregular periods. Many females stop having periods while taking this drug. If you have received your injections on time, your chance of being pregnant is very low. If you think you may be pregnant, see your health care professional as soon as possible. Tell your health care professional if you want to get pregnant within the next year. The effect of this medicine may last a long time after you get your last injection. What side effects may I notice from receiving this medicine? Side effects that you should report to your doctor or health care professional as soon as possible: -allergic reactions like skin rash, itching or hives, swelling of the face, lips, or tongue -breast tenderness or discharge -breathing problems -changes in vision -depression -feeling faint or  lightheaded, falls -fever -pain in the abdomen, chest, groin, or leg -problems with balance, talking, walking -unusually weak or tired -yellowing of the eyes or skin Side effects that usually do not require medical attention (report to your doctor or health care professional if they continue or are bothersome): -acne -fluid retention and swelling -headache -irregular periods, spotting, or absent periods -temporary pain, itching, or skin reaction at site where injected -weight gain This list may not describe all possible side effects. Call your doctor for medical advice about side effects. You may report side effects to FDA at 1-800-FDA-1088. Where should I keep my medicine? This does not apply. The injection will be given to you by a health care professional. NOTE: This sheet is a summary. It may not cover all possible information. If you have questions about this medicine, talk to your doctor, pharmacist, or health care provider.  2015, [employer]. (2008-08-06 18:37:56) Grief Reaction Grief is a normal response to the death of someone close to you. Feelings of fear, anger, and guilt can affect almost everyone who loses someone  they love. Symptoms of depression are also common. These include problems with sleep, loss of appetite, and lack of energy. These grief reaction symptoms often last for weeks to months after a loss. They may also return during special times that remind you of the person you lost, such as an anniversary or birthday. Anxiety, insomnia, irritability, and deep depression may last beyond the period of normal grief. If you experience these feelings for 6 months or longer, you may have clinical depression. Clinical depression requires further medical attention. If you think that you have clinical depression, you should contact your caregiver. If you have a history of depression or a family history of depression, you are at greater risk of clinical depression. You are  also at greater risk of developing clinical depression if the loss was traumatic or the loss was of someone with whom you had unresolved issues.  A grief reaction can become complicated by being blocked. This means being unable to cry or express extreme emotions. This may prolong the grieving period and worsen the emotional effects of the loss. Mourning is a natural event in human life. A healthy grief reaction is one that is not blocked. It requires a time of sadness and readjustment. It is very important to share your sorrow and fear with others, especially close friends and family. Professional counselors and clergy can also help you process your grief. Document Released: 07/16/2005 Document Revised: 11/30/2013 Document Reviewed: 03/26/2006 [facility] Patient Information 2015 [place], [place]. This information is not intended to replace advice given to you by your health care provider. Make sure you discuss any questions you have with your health care provider.

## 2014-09-20 ENCOUNTER — Encounter (HOSPITAL_COMMUNITY): Payer: Self-pay | Admitting: Obstetrics and Gynecology

## 2014-09-23 ENCOUNTER — Encounter (HOSPITAL_COMMUNITY): Payer: Self-pay | Admitting: *Deleted

## 2014-09-30 NOTE — Op Note (Signed)
Preop Diagnosis: retained placenta   Postop Diagnosis: retained placenta   Procedure: SUCTION DILATATION AND CURETTAGE   Anesthesia: per anesthesiologist  Attending: Osborn CohoAngela Dhiren Azimi, MD  Assistant: N/a  Findings: Moderate POCs  Pathology: POCs  Fluids: See flow sheet  UOP: See flow sheet  EBL: See flow sheet  Complications: None  Procedure: The patient was taken to the operating room after the risks benefits and alternatives were discussed with the patient, the patient verbalized understanding and consent signed and witnessed.  The patient was placed under MAC anesthesia, prepped and draped in the normal sterile fashion and a time out was performed.  A bivalve speculum was placed in the patient's vagina and the anterior lip of the cervix grasped with a single-tooth tenaculum. A paracervical block was administered using a total of 10 cc of 2% lidocaine. A suction curette was used. Suction curettage was performed until minimal tissue returned. Gentle sharp curettage was performed until a gritty texture was noted. Suction curettage was performed once again to remove any remaining debris. All instruments were removed. The count was correct. The patient was transferred to the recovery room in good condition.

## 2014-12-22 ENCOUNTER — Encounter (HOSPITAL_COMMUNITY): Payer: Self-pay | Admitting: Emergency Medicine

## 2014-12-22 ENCOUNTER — Emergency Department (HOSPITAL_COMMUNITY)
Admission: EM | Admit: 2014-12-22 | Discharge: 2014-12-22 | Disposition: A | Discharge status: Home / Self Care | Payer: Medicaid Other | Source: Home / Self Care | Attending: Family Medicine | Admitting: Family Medicine

## 2014-12-22 DIAGNOSIS — H6093 Unspecified otitis externa, bilateral: Secondary | ICD-10-CM

## 2014-12-22 DIAGNOSIS — J0100 Acute maxillary sinusitis, unspecified: Secondary | ICD-10-CM | POA: Diagnosis not present

## 2014-12-22 MED ORDER — FLUTICASONE PROPIONATE 50 MCG/ACT NA SUSP: 2.0000 | Freq: Every day | NASAL | Status: DC

## 2014-12-22 MED ORDER — NEOMYCIN-POLYMYXIN-HC 3.5-10000-1 OT SOLN: OTIC | Status: DC

## 2014-12-22 NOTE — ED Provider Notes (Signed)
CSN: [id number]     Arrival date & time 12/22/14  0809 History   First MD Initiated Contact with Patient 12/22/14 0919     No chief complaint on file.  (Consider location/radiation/quality/duration/timing/severity/associated sxs/prior Treatment) HPI Ear pain. Started 2 days ago. Bilateral. Left greater than right. Ibuprofen with minimal relief. Pain is constant. Getting worse. Denies discharge, fevers, headache, cough, shortness of breath, palpitations,. Patient also suffering from facial pain primarily in the cheek area. This also started just one day ago. Past Medical History  Diagnosis Date  . Asthma   . Asthma   . History of chicken pox   . Yeast infection   . Bacterial infection   . Trichomonas    Past Surgical History  Procedure Laterality Date  . Dilation and curettage of uterus    . Dilation and evacuation N/A 09/17/2014    Procedure: DILATATION AND EVACUATION;  Surgeon: Purcell Nails, MD;  Location: [facility];  Service: Gynecology;  Laterality: N/A;  . Dilation and curettage of uterus N/A 09/17/2014    Procedure: DILATATION AND CURETTAGE;  Surgeon: Purcell Nails, MD;  Location: [facility];  Service: Gynecology;  Laterality: N/A;   Family History  Problem Relation Age of Onset  . Hypertension Maternal Uncle   . Hypertension Paternal Uncle    History  Substance Use Topics  . Smoking status: Never Smoker   . Smokeless tobacco: Never Used  . Alcohol Use: No   OB History    Gravida Para Term Preterm AB TAB SAB Ectopic Multiple Living   0 1 0 1 0 0 1     Review of Systems Per HPI with all other pertinent systems negative.   Allergies  Shellfish allergy and Pollen extract  Home Medications   Prior to Admission medications   Medication Sig Start Date End Date Taking? Authorizing Provider  acetaminophen (TYLENOL) 500 MG tablet Take 500 mg by mouth every 6 (six) hours as needed for mild pain.    Historical Provider, MD  fluticasone (FLONASE) 50 MCG/ACT nasal  spray Place 2 sprays into both nostrils at bedtime. 12/22/14   Ozella Rocks, MD  neomycin-polymyxin-hydrocortisone (CORTISPORIN) otic solution 3-4 Drops, 4 times per day for 7 days 12/22/14   Ozella Rocks, MD  Prenatal Vit-Fe Fumarate-FA (PRENATAL MULTIVITAMIN) TABS tablet Take 1 tablet by mouth daily at 12 noon.    Historical Provider, MD   BP 124/72 mmHg  Pulse 90  Temp(Src) 99.9 F (37.7 C) (Oral)  Resp 16  SpO2 98% Physical Exam Physical Exam  Constitutional: oriented to person, place, and time. appears well-developed and well-nourished. No distress.  HENT:  Head: Normocephalic and atraumatic.  Left external canal with significant skin maceration primarily in the 6 to 12:00 positions as well as inflammation of the tympanic membrane. Right external canal with minimal skin erythema. Maxillary sinus tenderness. Oropharynx normal. Eyes: EOMI. PERRL.  Neck: Normal range of motion.  Cardiovascular: RRR, no m/r/g, 2+ distal pulses,  Pulmonary/Chest: Effort normal and breath sounds normal. No respiratory distress.  Abdominal: Soft. Bowel sounds are normal. NonTTP, no distension.  Musculoskeletal: Normal range of motion. Non ttp, no effusion.  Neurological: alert and oriented to person, place, and time.  Skin: Skin is warm. No rash noted. non diaphoretic.  Psychiatric: normal mood and affect. behavior is normal. Judgment and thought content normal.    ED Course  Procedures (including critical care time) Labs Review Labs Reviewed - No data to display  Imaging Review  No results found.   MDM   1. Otitis externa, bilateral   2. Subacute maxillary sinusitis    Start Cortisporin. Ibuprofen. Flonase. Start a daily allergy medicine such as Zyrtec.  Shelly Flattenavid Oviya Ammar, MD Family Medicine 12/22/2014, 9:38 AM      Ozella Rocksavid J Kaimana Neuzil, MD 12/22/14 [phone]

## 2014-12-22 NOTE — Discharge Instructions (Signed)
You have a significant left ear infection. There is some sign of infection in the right ear as well. Please use the antibiotic drops as prescribed. Please use Flonase for additional sinus relief. Please use ibuprofen 600 mg every 6 hours for additional pain and inflammation relief.

## 2014-12-22 NOTE — ED Notes (Signed)
Pt has been suffering from sinus congestion and pain and bilateral ear pain for two days.

## 2016-06-09 ENCOUNTER — Emergency Department (HOSPITAL_COMMUNITY): Payer: Medicaid Other

## 2016-06-09 ENCOUNTER — Inpatient Hospital Stay (HOSPITAL_COMMUNITY)
Admission: EM | Admit: 2016-06-09 | Discharge: 2016-06-17 | DRG: 113 | Disposition: A | Discharge status: Home / Self Care | Payer: Medicaid Other | Attending: Plastic Surgery | Admitting: Plastic Surgery

## 2016-06-09 ENCOUNTER — Encounter (HOSPITAL_COMMUNITY): Payer: Self-pay

## 2016-06-09 DIAGNOSIS — S065X9A Traumatic subdural hemorrhage with loss of consciousness of unspecified duration, initial encounter: Secondary | ICD-10-CM

## 2016-06-09 DIAGNOSIS — Z8249 Family history of ischemic heart disease and other diseases of the circulatory system: Secondary | ICD-10-CM

## 2016-06-09 DIAGNOSIS — J45909 Unspecified asthma, uncomplicated: Secondary | ICD-10-CM | POA: Diagnosis present

## 2016-06-09 DIAGNOSIS — S065XAA Traumatic subdural hemorrhage with loss of consciousness status unknown, initial encounter: Secondary | ICD-10-CM

## 2016-06-09 DIAGNOSIS — S066X1A Traumatic subarachnoid hemorrhage with loss of consciousness of 30 minutes or less, initial encounter: Secondary | ICD-10-CM | POA: Diagnosis present

## 2016-06-09 DIAGNOSIS — H05422 Enophthalmos due to trauma or surgery, left eye: Secondary | ICD-10-CM | POA: Diagnosis present

## 2016-06-09 DIAGNOSIS — Z91013 Allergy to seafood: Secondary | ICD-10-CM

## 2016-06-09 DIAGNOSIS — K649 Unspecified hemorrhoids: Secondary | ICD-10-CM | POA: Diagnosis present

## 2016-06-09 DIAGNOSIS — S0292XA Unspecified fracture of facial bones, initial encounter for closed fracture: Secondary | ICD-10-CM | POA: Diagnosis present

## 2016-06-09 DIAGNOSIS — I609 Nontraumatic subarachnoid hemorrhage, unspecified: Secondary | ICD-10-CM

## 2016-06-09 DIAGNOSIS — Z9109 Other allergy status, other than to drugs and biological substances: Secondary | ICD-10-CM

## 2016-06-09 DIAGNOSIS — S0101XA Laceration without foreign body of scalp, initial encounter: Secondary | ICD-10-CM

## 2016-06-09 DIAGNOSIS — S2239XA Fracture of one rib, unspecified side, initial encounter for closed fracture: Secondary | ICD-10-CM

## 2016-06-09 DIAGNOSIS — S0232XA Fracture of orbital floor, left side, initial encounter for closed fracture: Principal | ICD-10-CM | POA: Diagnosis present

## 2016-06-09 HISTORY — DX: Unspecified fracture of facial bones, initial encounter for closed fracture: S02.92XA

## 2016-06-09 LAB — CBC WITH DIFFERENTIAL/PLATELET
Basophils Absolute: 0 10*3/uL (ref 0.0–0.1)
Basophils Relative: 0 %
EOS PCT: 0 %
Eosinophils Absolute: 0 10*3/uL (ref 0.0–0.7)
HCT: 39.5 % (ref 36.0–46.0)
Hemoglobin: 13 g/dL (ref 12.0–15.0)
LYMPHS ABS: 0.7 10*3/uL (ref 0.7–4.0)
Lymphocytes Relative: 4 %
MCH: 28.3 pg (ref 26.0–34.0)
MCHC: 32.9 g/dL (ref 30.0–36.0)
MCV: 86.1 fL (ref 78.0–100.0)
MONO ABS: 1 10*3/uL (ref 0.1–1.0)
MONOS PCT: 5 %
Neutro Abs: 15.9 10*3/uL — ABNORMAL HIGH (ref 1.7–7.7)
Neutrophils Relative %: 91 %
PLATELETS: 275 10*3/uL (ref 150–400)
RBC: 4.59 MIL/uL (ref 3.87–5.11)
RDW: 13.6 % (ref 11.5–15.5)
WBC: 17.5 10*3/uL — AB (ref 4.0–10.5)

## 2016-06-09 LAB — BASIC METABOLIC PANEL
Anion gap: 9 (ref 5–15)
BUN: 9 mg/dL (ref 6–20)
CO2: 24 mmol/L (ref 22–32)
Calcium: 9.4 mg/dL (ref 8.9–10.3)
Chloride: 107 mmol/L (ref 101–111)
Creatinine, Ser: 0.93 mg/dL (ref 0.44–1.00)
GFR calc Af Amer: >60 mL/min (ref >60)
Glucose, Bld: 116 mg/dL — ABNORMAL HIGH (ref 65–99)
POTASSIUM: 3.7 mmol/L (ref 3.5–5.1)
Sodium: 140 mmol/L (ref 135–145)

## 2016-06-09 LAB — POC URINE PREG, ED: Preg Test, Ur: NEGATIVE

## 2016-06-09 LAB — MRSA PCR SCREENING: MRSA by PCR: NEGATIVE

## 2016-06-09 MED ORDER — LIDOCAINE-EPINEPHRINE 1 %-1:100000 IJ SOLN
10.0000 mL | Freq: Once | INTRAMUSCULAR | Status: AC
  Administered 2016-06-09: 10 mL via INTRADERMAL
  Filled 2016-06-09: qty 1

## 2016-06-09 MED ORDER — OXYCODONE HCL 5 MG PO TABS
5.0000 mg | ORAL_TABLET | ORAL | Status: DC | PRN
  Administered 2016-06-09 – 2016-06-12 (×14): 5 mg via ORAL
  Filled 2016-06-09 (×16): qty 1

## 2016-06-09 MED ORDER — SODIUM CHLORIDE 0.9 % IV SOLN
INTRAVENOUS | Status: DC
  Administered 2016-06-09: 07:00:00 via INTRAVENOUS

## 2016-06-09 MED ORDER — ACETAMINOPHEN 325 MG PO TABS
650.0000 mg | ORAL_TABLET | ORAL | Status: DC | PRN
  Administered 2016-06-10: 650 mg via ORAL
  Filled 2016-06-09: qty 2

## 2016-06-09 MED ORDER — HYDROMORPHONE HCL 2 MG/ML IJ SOLN: 0.5000 mg | INTRAMUSCULAR | Status: DC | PRN

## 2016-06-09 MED ORDER — HYDROMORPHONE HCL 1 MG/ML IJ SOLN
0.5000 mg | INTRAMUSCULAR | Status: DC | PRN
  Administered 2016-06-09 (×6): 0.5 mg via INTRAVENOUS
  Filled 2016-06-09 (×7): qty 1

## 2016-06-09 MED ORDER — DOCUSATE SODIUM 100 MG PO CAPS
100.0000 mg | ORAL_CAPSULE | Freq: Two times a day (BID) | ORAL | Status: DC
  Administered 2016-06-11 – 2016-06-17 (×13): 100 mg via ORAL
  Filled 2016-06-09 (×14): qty 1

## 2016-06-09 MED ORDER — ONDANSETRON HCL 4 MG PO TABS
4.0000 mg | ORAL_TABLET | Freq: Four times a day (QID) | ORAL | Status: DC | PRN
  Filled 2016-06-09 (×2): qty 1

## 2016-06-09 MED ORDER — ONDANSETRON HCL 4 MG/2ML IJ SOLN
4.0000 mg | Freq: Four times a day (QID) | INTRAMUSCULAR | Status: DC | PRN
  Administered 2016-06-09 – 2016-06-11 (×5): 4 mg via INTRAVENOUS
  Filled 2016-06-09 (×5): qty 2

## 2016-06-09 MED ORDER — FENTANYL CITRATE (PF) 100 MCG/2ML IJ SOLN
50.0000 ug | INTRAMUSCULAR | Status: AC | PRN
  Administered 2016-06-09 (×3): 50 ug via INTRAVENOUS
  Filled 2016-06-09 (×3): qty 2
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
## 2016-06-09 NOTE — ED Notes (Signed)
Attempted to call report
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
## 2016-06-09 NOTE — Progress Notes (Signed)
Assumed care of the patient. Will continue to monitor.
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
## 2016-06-09 NOTE — ED Notes (Signed)
Pt and pt family verbalize okay to give [facility] updates on pt condition and diagnosis

## 2016-06-09 NOTE — ED Notes (Signed)
Sophronia SimasSargent Mackintosh with [employer] updated about pt status. He is requesting if  Any changes in pt condition after 0700 Alda LeaSargent Ryan notified [phone]
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
## 2016-06-10 ENCOUNTER — Ambulatory Visit: Payer: Self-pay | Admitting: Plastic Surgery

## 2016-06-10 DIAGNOSIS — S0232XA Fracture of orbital floor, left side, initial encounter for closed fracture: Secondary | ICD-10-CM

## 2016-06-10 LAB — PROTIME-INR
INR: 0.99
PROTHROMBIN TIME: 13.1 s (ref 11.4–15.2)

## 2016-06-10 LAB — COMPREHENSIVE METABOLIC PANEL
ALK PHOS: 50 U/L (ref 38–126)
ALT: 16 U/L (ref 14–54)
AST: 22 U/L (ref 15–41)
Albumin: 3.4 g/dL — ABNORMAL LOW (ref 3.5–5.0)
Anion gap: 8 (ref 5–15)
BILIRUBIN TOTAL: 0.6 mg/dL (ref 0.3–1.2)
CALCIUM: 9 mg/dL (ref 8.9–10.3)
CO2: 27 mmol/L (ref 22–32)
CREATININE: 0.69 mg/dL (ref 0.44–1.00)
Chloride: 102 mmol/L (ref 101–111)
Glucose, Bld: 104 mg/dL — ABNORMAL HIGH (ref 65–99)
Potassium: 3.7 mmol/L (ref 3.5–5.1)
Sodium: 137 mmol/L (ref 135–145)
Total Protein: 6.5 g/dL (ref 6.5–8.1)

## 2016-06-10 LAB — CBC
HEMATOCRIT: 35.6 % — AB (ref 36.0–46.0)
HEMOGLOBIN: 11.6 g/dL — AB (ref 12.0–15.0)
MCH: 28 pg (ref 26.0–34.0)
MCHC: 32.6 g/dL (ref 30.0–36.0)
MCV: 86 fL (ref 78.0–100.0)
Platelets: 245 10*3/uL (ref 150–400)
RBC: 4.14 MIL/uL (ref 3.87–5.11)
RDW: 13.6 % (ref 11.5–15.5)
WBC: 9.1 10*3/uL (ref 4.0–10.5)

## 2016-06-10 LAB — APTT: aPTT: 30 seconds (ref 24–36)

## 2016-06-10 MED ORDER — HYDROMORPHONE HCL 1 MG/ML IJ SOLN
0.5000 mg | INTRAMUSCULAR | Status: DC | PRN
Start: 2016-06-10 — End: 2016-06-11
  Administered 2016-06-10 – 2016-06-11 (×7): 1 mg via INTRAVENOUS
  Filled 2016-06-10 (×7): qty 1
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
## 2016-06-11 MED ORDER — CHLORHEXIDINE GLUCONATE CLOTH 2 % EX PADS: 6.0000 | MEDICATED_PAD | Freq: Once | CUTANEOUS | Status: DC

## 2016-06-11 MED ORDER — CEFAZOLIN SODIUM-DEXTROSE 2-4 GM/100ML-% IV SOLN
2.0000 g | INTRAVENOUS | Status: DC
  Filled 2016-06-11: qty 100

## 2016-06-11 MED ORDER — DIPHENHYDRAMINE HCL 25 MG PO CAPS
25.0000 mg | ORAL_CAPSULE | Freq: Four times a day (QID) | ORAL | Status: DC | PRN
  Administered 2016-06-11 – 2016-06-12 (×3): 25 mg via ORAL
  Filled 2016-06-11 (×4): qty 1

## 2016-06-11 MED ORDER — DIPHENHYDRAMINE HCL 25 MG PO CAPS
50.0000 mg | ORAL_CAPSULE | Freq: Once | ORAL | Status: AC
  Administered 2016-06-11: 50 mg via ORAL
  Filled 2016-06-11: qty 2

## 2016-06-11 MED ORDER — HYDROMORPHONE HCL 2 MG/ML IJ SOLN
0.5000 mg | INTRAMUSCULAR | Status: DC | PRN
  Administered 2016-06-11 – 2016-06-12 (×3): 1 mg via INTRAVENOUS
  Filled 2016-06-11 (×3): qty 1

## 2016-06-11 NOTE — Progress Notes (Signed)
Pt transferred via wheelchair to [address], tele monitoring dc'd, all patient belongings at side, mother at side, RN made aware of code word, receiving NT at bedside.  Darrel HooverWilson,Oleva Koo S 10:38 AM
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
## 2016-06-11 NOTE — Progress Notes (Signed)
   06/11/16 1000  Clinical Encounter Type  Visited With Patient and family together;Health care provider  Visit Type Follow-up;Spiritual support  Spiritual Encounters  Spiritual Needs Emotional  Stress Factors  Patient Stress Factors Family relationships  Family Stress Factors Exhausted;Family relationships    Chaplain was leaving Peds from rounds and pt was with mother and tech transporting back to room after testing. Pt said she was tired and would appreciate a visit later. Mother updated chaplain on case and indicated that they were appreciative of care received.

## 2016-06-11 NOTE — Progress Notes (Addendum)
Attempted to call report, Okey Regalarol, RN not available at this time, a/w callback.  Louie BunWilson,Myles Mallicoat S 9:12 AM   Attempted to call report, RN remains unavailable to receive report, a/w call back.  10:10 AM

## 2016-06-12 ENCOUNTER — Encounter (HOSPITAL_COMMUNITY): Payer: Self-pay | Admitting: General Practice

## 2016-06-12 MED ORDER — CEFAZOLIN SODIUM-DEXTROSE 2-4 GM/100ML-% IV SOLN
2.0000 g | INTRAVENOUS | Status: DC
  Filled 2016-06-12: qty 100

## 2016-06-12 MED ORDER — OXYCODONE HCL 5 MG PO TABS
5.0000 mg | ORAL_TABLET | ORAL | Status: DC | PRN
  Administered 2016-06-12 – 2016-06-13 (×5): 15 mg via ORAL
  Administered 2016-06-13: 5 mg via ORAL
  Administered 2016-06-13 – 2016-06-14 (×4): 15 mg via ORAL
  Filled 2016-06-12 (×10): qty 3

## 2016-06-12 MED ORDER — GABAPENTIN 300 MG PO CAPS
300.0000 mg | ORAL_CAPSULE | Freq: Three times a day (TID) | ORAL | Status: DC
  Administered 2016-06-12 – 2016-06-17 (×15): 300 mg via ORAL
  Filled 2016-06-12 (×17): qty 1

## 2016-06-12 MED ORDER — POLYETHYLENE GLYCOL 3350 17 G PO PACK
17.0000 g | PACK | Freq: Every day | ORAL | Status: DC
  Administered 2016-06-12 – 2016-06-13 (×2): 17 g via ORAL
  Filled 2016-06-12 (×2): qty 1

## 2016-06-12 MED ORDER — HYDROMORPHONE HCL 2 MG/ML IJ SOLN
0.5000 mg | INTRAMUSCULAR | Status: DC | PRN
  Administered 2016-06-12 – 2016-06-14 (×10): 0.5 mg via INTRAVENOUS
  Filled 2016-06-12 (×9): qty 1

## 2016-06-12 MED ORDER — ACETAMINOPHEN 325 MG PO TABS
650.0000 mg | ORAL_TABLET | Freq: Four times a day (QID) | ORAL | Status: DC
  Administered 2016-06-12 – 2016-06-17 (×18): 650 mg via ORAL
  Filled 2016-06-12 (×20): qty 2
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
## 2016-06-13 ENCOUNTER — Encounter (HOSPITAL_COMMUNITY): Payer: Self-pay | Admitting: Plastic Surgery

## 2016-06-13 ENCOUNTER — Inpatient Hospital Stay (HOSPITAL_COMMUNITY): Payer: Medicaid Other | Admitting: Anesthesiology

## 2016-06-13 ENCOUNTER — Encounter (HOSPITAL_COMMUNITY): Admission: EM | Disposition: A | Discharge status: Home / Self Care | Payer: Self-pay | Source: Home / Self Care

## 2016-06-13 HISTORY — PX: ORIF ORBITAL FRACTURE: SHX5312

## 2016-06-13 SURGERY — OPEN REDUCTION INTERNAL FIXATION (ORIF) ORBITAL FRACTURE
Anesthesia: General | Site: Face | Laterality: Left

## 2016-06-13 MED ORDER — LIDOCAINE 2% (20 MG/ML) 5 ML SYRINGE
INTRAMUSCULAR | Status: AC
  Filled 2016-06-13: qty 5

## 2016-06-13 MED ORDER — HYDROMORPHONE HCL 2 MG/ML IJ SOLN
INTRAMUSCULAR | Status: AC
Start: 2016-06-13 — End: 2016-06-14
  Filled 2016-06-13: qty 1

## 2016-06-13 MED ORDER — FENTANYL CITRATE (PF) 100 MCG/2ML IJ SOLN
INTRAMUSCULAR | Status: AC
  Filled 2016-06-13: qty 4

## 2016-06-13 MED ORDER — PROPOFOL 10 MG/ML IV BOLUS
INTRAVENOUS | Status: DC | PRN
  Administered 2016-06-13: 200 mg via INTRAVENOUS

## 2016-06-13 MED ORDER — 0.9 % SODIUM CHLORIDE (POUR BTL) OPTIME
TOPICAL | Status: DC | PRN
  Administered 2016-06-13: 1000 mL

## 2016-06-13 MED ORDER — LIDOCAINE HCL (CARDIAC) 20 MG/ML IV SOLN
INTRAVENOUS | Status: DC | PRN
  Administered 2016-06-13: 100 mg via INTRAVENOUS

## 2016-06-13 MED ORDER — PROPOFOL 10 MG/ML IV BOLUS
INTRAVENOUS | Status: AC
  Filled 2016-06-13: qty 20

## 2016-06-13 MED ORDER — CEFAZOLIN SODIUM 1 G IJ SOLR
INTRAMUSCULAR | Status: AC
  Filled 2016-06-13: qty 20

## 2016-06-13 MED ORDER — EPHEDRINE 5 MG/ML INJ
INTRAVENOUS | Status: AC
  Filled 2016-06-13: qty 10

## 2016-06-13 MED ORDER — CEFAZOLIN SODIUM 1 G IJ SOLR
INTRAMUSCULAR | Status: DC | PRN
  Administered 2016-06-13: 2 g via INTRAMUSCULAR

## 2016-06-13 MED ORDER — ONDANSETRON HCL 4 MG/2ML IJ SOLN
INTRAMUSCULAR | Status: DC | PRN
  Administered 2016-06-13: 4 mg via INTRAVENOUS

## 2016-06-13 MED ORDER — LACTATED RINGERS IV SOLN
INTRAVENOUS | Status: DC
  Administered 2016-06-13 (×3): via INTRAVENOUS

## 2016-06-13 MED ORDER — HYDROMORPHONE HCL 1 MG/ML IJ SOLN
0.2500 mg | INTRAMUSCULAR | Status: DC | PRN
  Administered 2016-06-13: 0.5 mg via INTRAVENOUS

## 2016-06-13 MED ORDER — MIDAZOLAM HCL 2 MG/2ML IJ SOLN
INTRAMUSCULAR | Status: AC
  Filled 2016-06-13: qty 2

## 2016-06-13 MED ORDER — DEXAMETHASONE SODIUM PHOSPHATE 10 MG/ML IJ SOLN
INTRAMUSCULAR | Status: DC | PRN
  Administered 2016-06-13: 10 mg via INTRAVENOUS

## 2016-06-13 MED ORDER — BSS IO SOLN
INTRAOCULAR | Status: AC
  Filled 2016-06-13: qty 30

## 2016-06-13 MED ORDER — ONDANSETRON HCL 4 MG/2ML IJ SOLN
INTRAMUSCULAR | Status: AC
  Filled 2016-06-13: qty 2

## 2016-06-13 MED ORDER — FENTANYL CITRATE (PF) 100 MCG/2ML IJ SOLN
INTRAMUSCULAR | Status: DC | PRN
  Administered 2016-06-13: 50 ug via INTRAVENOUS
  Administered 2016-06-13: 100 ug via INTRAVENOUS
  Administered 2016-06-13: 50 ug via INTRAVENOUS

## 2016-06-13 MED ORDER — TOBRAMYCIN-DEXAMETHASONE 0.3-0.1 % OP OINT
1.0000 "application " | TOPICAL_OINTMENT | Freq: Two times a day (BID) | OPHTHALMIC | Status: DC
  Administered 2016-06-13 – 2016-06-17 (×8): 1 via OPHTHALMIC
  Filled 2016-06-13: qty 3.5

## 2016-06-13 MED ORDER — LIDOCAINE-EPINEPHRINE 1 %-1:100000 IJ SOLN
INTRAMUSCULAR | Status: DC | PRN
  Administered 2016-06-13: 1 mL

## 2016-06-13 MED ORDER — LIDOCAINE-EPINEPHRINE 1 %-1:100000 IJ SOLN
INTRAMUSCULAR | Status: AC
  Filled 2016-06-13: qty 1

## 2016-06-13 MED ORDER — SUCCINYLCHOLINE CHLORIDE 200 MG/10ML IV SOSY
PREFILLED_SYRINGE | INTRAVENOUS | Status: AC
  Filled 2016-06-13: qty 10

## 2016-06-13 MED ORDER — TOBRAMYCIN 0.3 % OP OINT
TOPICAL_OINTMENT | Freq: Two times a day (BID) | OPHTHALMIC | Status: DC
  Filled 2016-06-13: qty 3.5

## 2016-06-13 MED ORDER — DEXAMETHASONE SODIUM PHOSPHATE 10 MG/ML IJ SOLN
INTRAMUSCULAR | Status: AC
  Filled 2016-06-13: qty 1

## 2016-06-13 MED ORDER — SUCCINYLCHOLINE CHLORIDE 20 MG/ML IJ SOLN
INTRAMUSCULAR | Status: DC | PRN
  Administered 2016-06-13: 100 mg via INTRAVENOUS

## 2016-06-13 SURGICAL SUPPLY — 59 items
BLADE 10 SAFETY STRL DISP (BLADE) ×3 IMPLANT
BLADE SURG 15 STRL LF DISP TIS (BLADE) ×1 IMPLANT
BLADE SURG 15 STRL SS (BLADE) ×3
BLADE SURG ROTATE 9660 (MISCELLANEOUS) IMPLANT
CANISTER SUCTION 2500CC (MISCELLANEOUS) ×3 IMPLANT
CLEANER TIP ELECTROSURG 2X2 (MISCELLANEOUS) ×3 IMPLANT
CLOSURE WOUND 1/2 X4 (GAUZE/BANDAGES/DRESSINGS) ×1
COVER SURGICAL LIGHT HANDLE (MISCELLANEOUS) ×3 IMPLANT
DECANTER SPIKE VIAL GLASS SM (MISCELLANEOUS) ×3 IMPLANT
ELECT COATED BLADE 2.86 ST (ELECTRODE) ×3 IMPLANT
ELECT NDL TIP 2.8 STRL (NEEDLE) ×1 IMPLANT
ELECT NEEDLE TIP 2.8 STRL (NEEDLE) ×3 IMPLANT
ELECT REM PT RETURN 9FT ADLT (ELECTROSURGICAL) ×3
ELECTRODE REM PT RTRN 9FT ADLT (ELECTROSURGICAL) ×1 IMPLANT
GLOVE BIO SURGEON STRL SZ 6.5 (GLOVE) ×5 IMPLANT
GLOVE BIO SURGEONS STRL SZ 6.5 (GLOVE) ×4
GLOVE BIOGEL PI IND STRL 6.5 (GLOVE) IMPLANT
GLOVE BIOGEL PI INDICATOR 6.5 (GLOVE) ×2
GLOVE ECLIPSE 6.5 STRL STRAW (GLOVE) ×2 IMPLANT
GOWN STRL REUS W/ TWL LRG LVL3 (GOWN DISPOSABLE) ×2 IMPLANT
GOWN STRL REUS W/TWL LRG LVL3 (GOWN DISPOSABLE) ×6
IMPL MEDPOR MTB LEFT 41X42 (Mesh General) IMPLANT
IMPLANT MEDPOR MTB LEFT 41X42 (Mesh General) ×3 IMPLANT
KIT BASIN OR (CUSTOM PROCEDURE TRAY) ×3 IMPLANT
KIT ROOM TURNOVER OR (KITS) ×3 IMPLANT
MARKER SKIN DUAL TIP RULER LAB (MISCELLANEOUS) ×2 IMPLANT
NDL HYPO 30X.5 LL (NEEDLE) ×1 IMPLANT
NDL PRECISIONGLIDE 27X1.5 (NEEDLE) ×1 IMPLANT
NEEDLE HYPO 30X.5 LL (NEEDLE) ×3 IMPLANT
NEEDLE PRECISIONGLIDE 27X1.5 (NEEDLE) ×3 IMPLANT
NS IRRIG 1000ML POUR BTL (IV SOLUTION) ×3 IMPLANT
PAD ARMBOARD 7.5X6 YLW CONV (MISCELLANEOUS) ×6 IMPLANT
PENCIL BUTTON HOLSTER BLD 10FT (ELECTRODE) ×3 IMPLANT
PROTECTOR CORNEAL (OPHTHALMIC RELATED) ×2 IMPLANT
SCISSORS WIRE ANG 4 3/4 DISP (INSTRUMENTS) ×3 IMPLANT
SCREW MIDFACE 1.7X3 SLF DRILL (Screw) ×2 IMPLANT
SCREW MIDFACE 1.7X4 SLF DRILL (Screw) ×2 IMPLANT
SCREW UPPERFACE 1.2X4M SLFDRIL (Screw) ×2 IMPLANT
SCREW UPPERFACE1.2X3M SLFDRILL (Screw) ×2 IMPLANT
STRIP CLOSURE SKIN 1/2X4 (GAUZE/BANDAGES/DRESSINGS) ×1 IMPLANT
SUCTION FRAZIER HANDLE 10FR (MISCELLANEOUS) ×2
SUCTION TUBE FRAZIER 10FR DISP (MISCELLANEOUS) IMPLANT
SUT ETHILON 4 0 CL P 3 (SUTURE) IMPLANT
SUT MON AB 5-0 P3 18 (SUTURE) ×2 IMPLANT
SUT PROLENE 6 0 PC 1 (SUTURE) IMPLANT
SUT SILK 4 0 P 3 (SUTURE) ×2 IMPLANT
SUT STEEL 0 (SUTURE)
SUT STEEL 0 18XMFL TIE 17 (SUTURE) IMPLANT
SUT STEEL 2 (SUTURE) IMPLANT
SUT VIC AB 5-0 P-3 18XBRD (SUTURE) IMPLANT
SUT VIC AB 5-0 P3 18 (SUTURE) ×3
SUT VIC AB 5-0 RB1 27 (SUTURE) ×6 IMPLANT
SUT VICRYL 4-0 PS2 18IN ABS (SUTURE) IMPLANT
SYR BULB IRRIGATION 50ML (SYRINGE) ×2 IMPLANT
TOWEL OR 17X24 6PK STRL BLUE (TOWEL DISPOSABLE) ×3 IMPLANT
TOWEL OR 17X26 10 PK STRL BLUE (TOWEL DISPOSABLE) ×3 IMPLANT
TRAY ENT MC OR (CUSTOM PROCEDURE TRAY) ×3 IMPLANT
TRAY FOLEY CATH 14FRSI W/METER (CATHETERS) IMPLANT
WATER STERILE IRR 1000ML POUR (IV SOLUTION) ×3 IMPLANT
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
## 2016-06-13 NOTE — Anesthesia Procedure Notes (Signed)
Procedure Name: Intubation Date/Time: 06/13/2016 2:07 PM Performed by: Ferol LuzMCMILLEN, Mattson Dayal L Pre-anesthesia Checklist: Patient identified, Emergency Drugs available, Suction available and Patient being monitored Patient Re-evaluated:Patient Re-evaluated prior to inductionOxygen Delivery Method: Circle System Utilized Preoxygenation: Pre-oxygenation with 100% oxygen Intubation Type: IV induction Ventilation: Mask ventilation without difficulty Laryngoscope Size: Mac and 3 Grade View: Grade I Tube type: Oral Tube size: 7.5 mm Number of attempts: 1 Airway Equipment and Method: Stylet and Oral airway Placement Confirmation: ETT inserted through vocal cords under direct vision,  positive ETCO2 and breath sounds checked- equal and bilateral Secured at: 20 cm Tube secured with: Tape Dental Injury: Teeth and Oropharynx as per pre-operative assessment

## 2016-06-13 NOTE — Brief Op Note (Signed)
06/09/2016 - 06/13/2016  3:09 PM  PATIENT:  Kristin Lyons  25 y.o. female  PRE-OPERATIVE DIAGNOSIS:  orbital fx  POST-OPERATIVE DIAGNOSIS:  orbital fracture  PROCEDURE:  Procedure(s): OPEN REDUCTION INTERNAL FIXATION (ORIF) ORBITAL FRACTURE (Left)  SURGEON:  Surgeon(s) and Role:    * Alena Billslaire S Layaan Mott, DO - Primary  PHYSICIAN ASSISTANT: Shawn Rayburn, PA  ASSISTANTS: none   ANESTHESIA:   general  EBL:  No intake/output data recorded.  BLOOD ADMINISTERED:none  DRAINS: none   LOCAL MEDICATIONS USED:  LIDOCAINE   SPECIMEN:  No Specimen  DISPOSITION OF SPECIMEN:  N/A  COUNTS:  YES  TOURNIQUET:  * No tourniquets in log *  DICTATION: .Dragon Dictation  PLAN OF CARE: Admit to inpatient   PATIENT DISPOSITION:  PACU - hemodynamically stable.   Delay start of Pharmacological VTE agent (>24hrs) due to surgical blood loss or risk of bleeding: no

## 2016-06-13 NOTE — Interval H&P Note (Signed)
History and Physical Interval Note:  06/13/2016 1:15 PM  Kristin Lyons  has presented today for surgery, with the diagnosis of orbital fx  The various methods of treatment have been discussed with the patient and family. After consideration of risks, benefits and other options for treatment, the patient has consented to  Procedure(s): OPEN REDUCTION INTERNAL FIXATION (ORIF) ORBITAL FRACTURE (Left) as a surgical intervention .  The patient's history has been reviewed, patient examined, no change in status, stable for surgery.  I have reviewed the patient's chart and labs.  Questions were answered to the patient's satisfaction.     Peggye FormLAIRE S Magenta Schmiesing
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
## 2016-06-13 NOTE — Anesthesia Postprocedure Evaluation (Signed)
Anesthesia Post Note  Patient: Kristin Lyons  Procedure(s) Performed: Procedure(s) (LRB): OPEN REDUCTION INTERNAL FIXATION (ORIF) ORBITAL FRACTURE (Left)  Patient location during evaluation: PACU Anesthesia Type: General Level of consciousness: awake Pain management: pain level controlled Vital Signs Assessment: post-procedure vital signs reviewed and stable Cardiovascular status: stable Anesthetic complications: no    Last Vitals:  Vitals:   06/13/16 1524 06/13/16 1530  BP: (!) 154/94   Pulse: 94 92  Resp:  13  Temp: 36.4 C     Last Pain:  Vitals:   06/13/16 1551  TempSrc:   PainSc: 10-Worst pain ever                 EDWARDS,Benjerman Molinelli
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
## 2016-06-13 NOTE — Transfer of Care (Signed)
Immediate Anesthesia Transfer of Care Note  Patient: Kristin Lyons  Procedure(s) Performed: Procedure(s): OPEN REDUCTION INTERNAL FIXATION (ORIF) ORBITAL FRACTURE (Left)  Patient Location: PACU  Anesthesia Type:General  Level of Consciousness: awake, alert , oriented and patient cooperative  Airway & Oxygen Therapy: Patient Spontanous Breathing and Patient connected to nasal cannula oxygen  Post-op Assessment: Report given to RN, Post -op Vital signs reviewed and stable and Patient moving all extremities  Post vital signs: Reviewed and stable  Last Vitals:  Vitals:   06/12/16 2119 06/13/16 0553  BP: 127/85 (!) 102/49  Pulse: (!) 102 85  Resp: 17 16  Temp: 36.9 C 36.8 C    Last Pain:  Vitals:   06/13/16 1211  TempSrc:   PainSc: 2       Patients Stated Pain Goal: 2 (06/11/16 1722)  Complications: No apparent anesthesia complications
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
## 2016-06-14 ENCOUNTER — Encounter (HOSPITAL_COMMUNITY): Payer: Self-pay | Admitting: Plastic Surgery

## 2016-06-14 MED ORDER — ENOXAPARIN SODIUM 40 MG/0.4ML ~~LOC~~ SOLN
40.0000 mg | SUBCUTANEOUS | Status: DC
  Administered 2016-06-14 – 2016-06-17 (×4): 40 mg via SUBCUTANEOUS
  Filled 2016-06-14 (×4): qty 0.4

## 2016-06-14 MED ORDER — NAPROXEN 250 MG PO TABS
500.0000 mg | ORAL_TABLET | Freq: Two times a day (BID) | ORAL | Status: DC
  Administered 2016-06-14 – 2016-06-17 (×6): 500 mg via ORAL
  Filled 2016-06-14 (×7): qty 2

## 2016-06-14 MED ORDER — POLYETHYLENE GLYCOL 3350 17 G PO PACK
17.0000 g | PACK | Freq: Every day | ORAL | Status: DC
  Administered 2016-06-15 – 2016-06-17 (×3): 17 g via ORAL
  Filled 2016-06-14 (×3): qty 1

## 2016-06-14 MED ORDER — TRAMADOL HCL 50 MG PO TABS
100.0000 mg | ORAL_TABLET | Freq: Four times a day (QID) | ORAL | Status: DC
  Administered 2016-06-14 – 2016-06-17 (×11): 100 mg via ORAL
  Filled 2016-06-14 (×12): qty 2

## 2016-06-14 MED ORDER — OXYCODONE HCL 5 MG PO TABS
10.0000 mg | ORAL_TABLET | ORAL | Status: DC | PRN
  Administered 2016-06-14: 15 mg via ORAL
  Administered 2016-06-15 – 2016-06-17 (×6): 20 mg via ORAL
  Filled 2016-06-14: qty 4
  Filled 2016-06-14: qty 3
  Filled 2016-06-14 (×7): qty 4

## 2016-06-14 MED ORDER — MAGNESIUM CITRATE PO SOLN
1.0000 | Freq: Once | ORAL | Status: AC
  Administered 2016-06-14: 1 via ORAL
  Filled 2016-06-14: qty 296

## 2016-06-14 NOTE — Progress Notes (Signed)
Patient asked if she could use contact lens in her right eye. I asked the PA Focht if that was okay and she said that was fine.

## 2016-06-14 NOTE — Progress Notes (Signed)
Responded to call from unit nurse to visit with patient.  Upon arriving on unit the unit Director briefed me on patient status and reason for being here.  Patient( beautician) good  friend at bedside providing support.  Patient was not ready to talk at this time butdid welcome a visit.at a later time. Spoke brief word of encouragement to patient and told her that our services are here for her when she was ready to talk.  Patient is a little overwhelmed with calls and questions and people flooding her with their advice. My assestment is that   patient needs a compassionate empathetic listener. Someone who hear her and provide the proper non-judgmental guidance that will help her process her feeling and prepare for her discharge and days ahead. Chaplain available as needed. Kristin Lyons, Kristin Lyons, Keenehaplain, Pager [phone]
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
## 2016-06-15 NOTE — Progress Notes (Signed)
Patient's staples from posterior head removed, no drainage, no complications.
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
## 2016-06-15 NOTE — Progress Notes (Signed)
Spoke with Doree AlbeeJessica Pa, told her that the patient's IV fell out/came out somehow, and asked if we could leave it out she said yes.
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
## 2016-06-16 MED ORDER — TOBRAMYCIN-DEXAMETHASONE 0.3-0.1 % OP OINT: 1.0000 "application " | TOPICAL_OINTMENT | Freq: Two times a day (BID) | OPHTHALMIC | 0 refills | Status: DC

## 2016-06-16 MED ORDER — POLYETHYLENE GLYCOL 3350 17 G PO PACK: PACK | ORAL | 0 refills | Status: DC

## 2016-06-16 MED ORDER — NAPROXEN 250 MG PO TABS: ORAL_TABLET | ORAL | Status: DC

## 2016-06-16 MED ORDER — GABAPENTIN 300 MG PO CAPS: 300.0000 mg | ORAL_CAPSULE | Freq: Three times a day (TID) | ORAL | 0 refills | Status: DC

## 2016-06-16 MED ORDER — OXYCODONE HCL 10 MG PO TABS: 10.0000 mg | ORAL_TABLET | ORAL | 0 refills | Status: DC | PRN
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
## 2016-06-16 NOTE — Discharge Instructions (Signed)
Traumatic Brain Injury °Traumatic brain injury (TBI) is an injury to your brain from a blow to your head (closed injury) or an object penetrating your skull and entering your brain (open injury). The severity of TBI varies significantly from one person to the next. Some TBIs cause you to pass out (lose consciousness) immediately and for a long period of time. Other TBIs do not cause any loss of consciousness. °Symptoms of any type of TBI can be long lasting (chronic). TBI can interfere with memory and speech. TBI can also cause chronic symptoms like headache or dizziness. °What are the causes? °TBI is caused by a closed or open injury. °What increases the risk? °You may be at higher risk for TBI if you: °· Are 75 or older. °· Are a man. °· Are in a car accident. °· Play a contact sport, especially football, hockey, or soccer. °· Do not wear protective gear while playing sports. °· Are in the military. °· Are a victim of violence. °· Abuse drugs or alcohol. °· Have had a previous TBI. ° °What are the signs or symptoms? °Signs and symptoms of TBI may occur right away or not until days, weeks, or months after the injury. They may last for days, weeks, months, or years. Symptoms may include: °· Loss of consciousness. °· Headache. °· Confusion. °· Fatigue. °· Changes in sleep. °· Dizziness. °· Mood or personality changes. °· Memory problems. °· Nausea or vomiting or both. °· Seizures. °· Clumsiness. °· Slurred speech. °· Depression and anxiety. °· Anger. °· Inability to control emotions or actions (impulse control). °· Loss of or dulling of your senses, such as hearing, vision, and touch. This can include: °? Blurred vision. °? Ringing in your ears. ° °How is this diagnosed? °TBI may be diagnosed by a medical history and physical exam. Your health care provider will also do a neurologic exam to check your: °· Reflexes. °· Sensations. °· Alertness. °· Memory. °· Vision. °· Hearing. °· Coordination. ° °Your health care  provider will also do tests to diagnose the extent of your TBI, such as a CT scan of your brain and skull. One way to determine the severity of your TBI is with a scoring system called the Glasgow Coma Scale (GCS). It measures eye opening, motor response, and verbal response. The higher the score, the milder the TBI. °Your TBI may be described as mild, moderate, or severe: °· Mild TBI (concussion). °? Symptoms of mild TBI usually go away on their own. This can take weeks or months, depending on the type of concussion. °? Your GCS will be 13-15. °? Your brain CT scan will be normal. °? You may or may not have a short hospital stay. °· Moderate TBI. °? Your GCS will be 9-12. °? Your brain CT scan will be abnormal. °? You will likely need a short hospital stay. °· Severe TBI. °? Your GCS will be 3-8. °? Your brain CT scan will be abnormal. °? You may need a long stay in the hospital. ° °How is this treated? °Emergency treatment of TBI may involve measures to maintain a clear airway and stable blood pressure. Brain surgery may be needed to: °· Remove a blood clot. °· Repair bleeding. °· Remove an object that has penetrated the brain, such as a skull fragment or a bullet. ° °Other treatments depend on your chronic signs and symptoms. These treatments include the following types of therapy: °· Physical. °· Occupational. °· Speech and language. °· Mental health. °·   Social support. ° °Follow these instructions at home: °· Carefully follow all your health care provider's instructions. °· Work closely with all your therapists, if necessary. °· Take medicines only as directed by your health care provider. Do not take aspirin or other anti-inflammatory medicines such as ibuprofen or naproxen unless approved by your health care provider. °· Do not abuse illegal drugs. °· Limit alcohol intake to no more than 1 drink per day for nonpregnant women and 2 drinks per day for men. One drink equals 12 ounces of beer, 5 ounces of wine,  or 1½ ounces of hard liquor. °· Avoid any situation where there is potential for another head injury, such as football, hockey, soccer, basketball, martial arts, downhill snow sports, and horseback riding. Do not do these activities until your health care provider approves. °· Rest. Rest helps the brain to heal. Make sure you: °? Get plenty of sleep at night. Avoid staying up late at night. °? Keep the same bedtime hours on weekends and weekdays. °? Rest during the day. Take daytime naps or rest breaks when you feel tired. °· Avoid excessive visual stimulation while recovering from a TBI. This includes work on the computer, watching TV, and reading. °· Try to avoid activities that cause physical or mental stress. Stay home from work or school as directed by your health care provider. °· Make lists to plan your day and help your memory. °· Do not drive, ride a bicycle, or operate heavy machinery until your health care provider approves. °· Seek support from friends and family. °· Keep all follow-up visits as directed by your health care provider. This is important. °· Watch your symptoms and tell others to do the same. Complications sometimes occur after a TBI. °How is this prevented? °· Wear a helmet while biking, skiing, skateboarding, skating, or doing similar activities. Wear your seatbelt while driving. °· Do not abuse alcohol or drugs. °· Do not drink and drive. °· Prevent falls at home by: °? Removing clutter and tripping hazards, including loose rugs, from floors and stairways. °? Using grab bars in bathrooms and handrails by stairs. °? Placing nonslip mats on floors and in bathtubs. °? Improving lighting in dim areas. °Contact a health care provider if: °Seek medical care if you have any of the following symptoms for more than two weeks after your injury: °· Chronic headaches. °· Dizziness or balance problems. °· Nausea. °· Vision problems. °· Increased sensitivity to noise or light. °· Depression or mood  swings. °· Anxiety or irritability. °· Memory problems. °· Difficulty concentrating or paying attention. °· Sleep problems. °· Feeling tired all the time. ° °Get help right away if: °· You have confusion or unusual drowsiness. °· It is difficult to wake you up. °· You have nausea or persistent, forceful vomiting. °· You feel like you are moving when you are not (vertigo). Your eyes may move rapidly back and forth. °· You have convulsions or faint. °· You have severe, persistent headaches that are not relieved by medicine. °· You cannot use your arms or legs normally. °· One of your pupils is larger than the other. °· You have clear or bloody discharge from your nose or ears. °· Your problems are getting worse, not better. °This information is not intended to replace advice given to you by your health care provider. Make sure you discuss any questions you have with your health care provider. °Document Released: 07/06/2002 Document Revised: 03/18/2016 Document Reviewed: 10/29/2013 °[employer] Interactive Patient Education ©   2017 [employer] ° °

## 2016-06-17 MED ORDER — OXYCODONE HCL 5 MG PO TABS: 5.0000 mg | ORAL_TABLET | ORAL | 0 refills | Status: DC | PRN

## 2016-06-17 MED ORDER — OXYCODONE HCL 10 MG PO TABS: 5.0000 mg | ORAL_TABLET | ORAL | 0 refills | Status: DC | PRN
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
## 2016-06-17 NOTE — Progress Notes (Signed)
OT Cancellation Note  Patient Details Name: Kristin Lyons MRN: [id number] DOB: 05-11-1991   Cancelled Treatment:    Reason Eval/Treat Not Completed: Patient declined, no reason specified. Pt declined OT because she still does not have contact, and eye will not tolerate contact - she believes that is the problem. Pt says that since being in the hospital, her vision is improving (TV used to be blurry and is now not). Pt educated in light management as it is still sensitive, and encouraged follow up. Pt says that she would wear glasses, but cannot afford them at this time. OT offered comfort, consolation, and encouragement moving forward.  Evern BioLaura J Royston Lyons 06/17/2016, 2:15 PM  Sherryl MangesLaura Syrus Lyons OTR/L [phone]

## 2016-06-17 NOTE — Progress Notes (Signed)
Discharge instructions gone over with patient. Home medications gone over. Prescriptions given. Follow up appointments to be made. Signs and symptoms of worsening condition discussed. Reasons to call the doctor and reasons to call 911 discussed. Patient verbalized understanding of instructions.
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
## 2016-07-09 ENCOUNTER — Telehealth (HOSPITAL_COMMUNITY): Payer: Self-pay

## 2016-07-10 NOTE — Telephone Encounter (Signed)
Suggested she f/u with Dr. Yetta BarreJones, provided name and number.

## 2017-03-01 ENCOUNTER — Inpatient Hospital Stay (HOSPITAL_COMMUNITY)
Admission: AD | Admit: 2017-03-01 | Discharge: 2017-03-01 | Disposition: A | Discharge status: Home / Self Care | Payer: 59 | Source: Ambulatory Visit | Attending: Obstetrics and Gynecology | Admitting: Obstetrics and Gynecology

## 2017-03-01 ENCOUNTER — Encounter (HOSPITAL_COMMUNITY): Payer: Self-pay

## 2017-03-01 DIAGNOSIS — O00109 Unspecified tubal pregnancy without intrauterine pregnancy: Secondary | ICD-10-CM

## 2017-03-01 DIAGNOSIS — R109 Unspecified abdominal pain: Secondary | ICD-10-CM | POA: Insufficient documentation

## 2017-03-01 DIAGNOSIS — O009 Unspecified ectopic pregnancy without intrauterine pregnancy: Secondary | ICD-10-CM | POA: Diagnosis present

## 2017-03-01 LAB — CBC WITH DIFFERENTIAL/PLATELET
BASOS ABS: 0 10*3/uL (ref 0.0–0.1)
BASOS PCT: 0 %
EOS PCT: 1 %
Eosinophils Absolute: 0.1 10*3/uL (ref 0.0–0.7)
HEMATOCRIT: 38.9 % (ref 36.0–46.0)
Hemoglobin: 12.9 g/dL (ref 12.0–15.0)
LYMPHS PCT: 28 %
Lymphs Abs: 2.3 10*3/uL (ref 0.7–4.0)
MCH: 27.9 pg (ref 26.0–34.0)
MCHC: 33.2 g/dL (ref 30.0–36.0)
MCV: 84 fL (ref 78.0–100.0)
MONO ABS: 0.4 10*3/uL (ref 0.1–1.0)
MONOS PCT: 5 %
Neutro Abs: 5.4 10*3/uL (ref 1.7–7.7)
Neutrophils Relative %: 66 %
PLATELETS: 313 10*3/uL (ref 150–400)
RBC: 4.63 MIL/uL (ref 3.87–5.11)
RDW: 14.2 % (ref 11.5–15.5)
WBC: 8.1 10*3/uL (ref 4.0–10.5)

## 2017-03-01 LAB — BUN: BUN: 6 mg/dL (ref 6–20)

## 2017-03-01 LAB — CREATININE, SERUM
CREATININE: 0.76 mg/dL (ref 0.44–1.00)
GFR calc Af Amer: >60 mL/min (ref >60)
GFR calc non Af Amer: >60 mL/min (ref >60)

## 2017-03-01 LAB — HCG, QUANTITATIVE, PREGNANCY: HCG, BETA CHAIN, QUANT, S: 2975 m[IU]/mL — AB (ref <5)

## 2017-03-01 LAB — AST: AST: 21 U/L (ref 15–41)

## 2017-03-01 MED ORDER — METHOTREXATE INJECTION FOR WOMEN'S HOSPITAL: 50.0000 mg/m2 | Freq: Once | INTRAMUSCULAR | Status: DC

## 2017-03-01 MED ORDER — METHOTREXATE INJECTION FOR WOMEN'S HOSPITAL
50.0000 mg/m2 | Freq: Once | INTRAMUSCULAR | Status: AC
  Administered 2017-03-01: 100 mg via INTRAMUSCULAR
  Filled 2017-03-01: qty 2

## 2017-03-01 NOTE — MAU Note (Signed)
Pt sent from office. Ectopic pregnancy confirmed by ultrasound.

## 2017-03-01 NOTE — Discharge Instructions (Signed)
Ectopic Pregnancy °An ectopic pregnancy happens when a fertilized egg grows outside the uterus. A pregnancy cannot live outside of the uterus. This problem often happens in the fallopian tube. It is often caused by damage to the fallopian tube. °If this problem is found early, you may be treated with medicine. If your tube tears or bursts open (ruptures), you will bleed inside. This is an emergency. You will need surgery. Get help right away. °What are the signs or symptoms? °You may have normal pregnancy symptoms at first. These include: °· Missing your period. °· Feeling sick to your stomach (nauseous). °· Being tired. °· Having tender breasts. ° °Then, you may start to have symptoms that are not normal. These include: °· Pain with sex (intercourse). °· Bleeding from the vagina. This includes light bleeding (spotting). °· Belly (abdomen) or lower belly cramping or pain. This may be felt on one side. °· A fast heartbeat (pulse). °· Passing out (fainting) after going poop (bowel movement). ° °If your tube tears, you may have symptoms such as: °· Really bad pain in the belly or lower belly. This happens suddenly. °· Dizziness. °· Passing out. °· Shoulder pain. ° °Get help right away if: °You have any of these symptoms. This is an emergency. °This information is not intended to replace advice given to you by your health care provider. Make sure you discuss any questions you have with your health care provider. °Document Released: 10/12/2008 Document Revised: 12/22/2015 Document Reviewed: 02/25/2013 °Elsevier Interactive Patient Education © 2017 [employer] ° °

## 2017-03-01 NOTE — MAU Note (Signed)
Urine in lab 

## 2017-03-05 ENCOUNTER — Inpatient Hospital Stay (HOSPITAL_COMMUNITY): Payer: 59

## 2017-03-05 ENCOUNTER — Inpatient Hospital Stay (HOSPITAL_COMMUNITY)
Admission: AD | Admit: 2017-03-05 | Discharge: 2017-03-05 | Disposition: A | Discharge status: Home / Self Care | Payer: 59 | Source: Ambulatory Visit | Attending: Obstetrics and Gynecology | Admitting: Obstetrics and Gynecology

## 2017-03-05 DIAGNOSIS — R1032 Left lower quadrant pain: Secondary | ICD-10-CM | POA: Diagnosis present

## 2017-03-05 DIAGNOSIS — O009 Unspecified ectopic pregnancy without intrauterine pregnancy: Secondary | ICD-10-CM | POA: Insufficient documentation

## 2017-03-05 DIAGNOSIS — T7491XA Unspecified adult maltreatment, confirmed, initial encounter: Secondary | ICD-10-CM

## 2017-03-05 LAB — URINALYSIS, ROUTINE W REFLEX MICROSCOPIC
BILIRUBIN URINE: NEGATIVE
Bacteria, UA: NONE SEEN
GLUCOSE, UA: NEGATIVE mg/dL
Hgb urine dipstick: NEGATIVE
KETONES UR: NEGATIVE mg/dL
Nitrite: NEGATIVE
PH: 5 (ref 5.0–8.0)
Protein, ur: NEGATIVE mg/dL
SPECIFIC GRAVITY, URINE: 1.028 (ref 1.005–1.030)

## 2017-03-05 LAB — COMPREHENSIVE METABOLIC PANEL
ALBUMIN: 4 g/dL (ref 3.5–5.0)
ALK PHOS: 65 U/L (ref 38–126)
ALT: 20 U/L (ref 14–54)
AST: 20 U/L (ref 15–41)
Anion gap: 8 (ref 5–15)
BILIRUBIN TOTAL: 0.4 mg/dL (ref 0.3–1.2)
BUN: 6 mg/dL (ref 6–20)
CO2: 25 mmol/L (ref 22–32)
Calcium: 9.4 mg/dL (ref 8.9–10.3)
Chloride: 104 mmol/L (ref 101–111)
Creatinine, Ser: 0.6 mg/dL (ref 0.44–1.00)
GFR calc Af Amer: >60 mL/min (ref >60)
GFR calc non Af Amer: >60 mL/min (ref >60)
GLUCOSE: 92 mg/dL (ref 65–99)
Potassium: 4.2 mmol/L (ref 3.5–5.1)
Sodium: 137 mmol/L (ref 135–145)
TOTAL PROTEIN: 7.4 g/dL (ref 6.5–8.1)

## 2017-03-05 LAB — HCG, QUANTITATIVE, PREGNANCY: hCG, Beta Chain, Quant, S: 3120 m[IU]/mL — ABNORMAL HIGH (ref <5)

## 2017-03-05 LAB — TYPE AND SCREEN
ABO/RH(D): O POS
ANTIBODY SCREEN: NEGATIVE

## 2017-03-05 LAB — CBC
HEMATOCRIT: 38.4 % (ref 36.0–46.0)
HEMOGLOBIN: 12.8 g/dL (ref 12.0–15.0)
MCH: 28 pg (ref 26.0–34.0)
MCHC: 33.3 g/dL (ref 30.0–36.0)
MCV: 84 fL (ref 78.0–100.0)
Platelets: 301 10*3/uL (ref 150–400)
RBC: 4.57 MIL/uL (ref 3.87–5.11)
RDW: 14.2 % (ref 11.5–15.5)
WBC: 7.4 10*3/uL (ref 4.0–10.5)

## 2017-03-05 MED ORDER — HYDROMORPHONE HCL 1 MG/ML IJ SOLN
1.0000 mg | INTRAMUSCULAR | Status: AC
  Administered 2017-03-05: 1 mg via INTRAVENOUS
  Filled 2017-03-05: qty 1

## 2017-03-05 MED ORDER — OXYCODONE HCL 5 MG PO TABS: 5.0000 mg | ORAL_TABLET | ORAL | 0 refills | Status: DC | PRN

## 2017-03-05 MED ORDER — LACTATED RINGERS IV SOLN
INTRAVENOUS | Status: DC
  Administered 2017-03-05: 125 mL/h via INTRAVENOUS

## 2017-03-05 MED ORDER — HYDROMORPHONE HCL 1 MG/ML IJ SOLN
1.0000 mg | INTRAMUSCULAR | Status: DC | PRN
  Administered 2017-03-05: 1 mg via INTRAVENOUS
  Filled 2017-03-05: qty 1
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
## 2017-03-05 NOTE — Discharge Instructions (Signed)
Ectopic Pregnancy °An ectopic pregnancy happens when a fertilized egg grows outside the uterus. A pregnancy cannot live outside of the uterus. This problem often happens in the fallopian tube. It is often caused by damage to the fallopian tube. °If this problem is found early, you may be treated with medicine. If your tube tears or bursts open (ruptures), you will bleed inside. This is an emergency. You will need surgery. Get help right away. °What are the signs or symptoms? °You may have normal pregnancy symptoms at first. These include: °· Missing your period. °· Feeling sick to your stomach (nauseous). °· Being tired. °· Having tender breasts. ° °Then, you may start to have symptoms that are not normal. These include: °· Pain with sex (intercourse). °· Bleeding from the vagina. This includes light bleeding (spotting). °· Belly (abdomen) or lower belly cramping or pain. This may be felt on one side. °· A fast heartbeat (pulse). °· Passing out (fainting) after going poop (bowel movement). ° °If your tube tears, you may have symptoms such as: °· Really bad pain in the belly or lower belly. This happens suddenly. °· Dizziness. °· Passing out. °· Shoulder pain. ° °Get help right away if: °You have any of these symptoms. This is an emergency. °This information is not intended to replace advice given to you by your health care provider. Make sure you discuss any questions you have with your health care provider. °Document Released: 10/12/2008 Document Revised: 12/22/2015 Document Reviewed: 02/25/2013 °Elsevier Interactive Patient Education © 2017 [employer] ° °

## 2017-03-05 NOTE — MAU Note (Addendum)
[facility] abdominal pain, sharp pain in the vagina also. Pain started around 0830 this morning. 10/10.

## 2017-03-05 NOTE — MAU Note (Signed)
Day 4 Post Methotrexate +lower abdominal pain Rating pain 10/10  Constant Sharp and cramping at times Has not taken anything for the pain  +vaginal bleeding Small amount per patient

## 2017-03-14 ENCOUNTER — Encounter (HOSPITAL_COMMUNITY): Payer: Self-pay | Admitting: *Deleted

## 2017-03-14 ENCOUNTER — Inpatient Hospital Stay (HOSPITAL_COMMUNITY)
Admission: AD | Admit: 2017-03-14 | Discharge: 2017-03-14 | Disposition: A | Discharge status: Home / Self Care | Payer: 59 | Source: Ambulatory Visit | Attending: Obstetrics and Gynecology | Admitting: Obstetrics and Gynecology

## 2017-03-14 DIAGNOSIS — Z3A08 8 weeks gestation of pregnancy: Secondary | ICD-10-CM | POA: Diagnosis not present

## 2017-03-14 DIAGNOSIS — R109 Unspecified abdominal pain: Secondary | ICD-10-CM | POA: Diagnosis present

## 2017-03-14 DIAGNOSIS — O009 Unspecified ectopic pregnancy without intrauterine pregnancy: Secondary | ICD-10-CM | POA: Insufficient documentation

## 2017-03-14 DIAGNOSIS — O00102 Left tubal pregnancy without intrauterine pregnancy: Secondary | ICD-10-CM

## 2017-03-14 LAB — CBC
HEMATOCRIT: 37.6 % (ref 36.0–46.0)
HEMOGLOBIN: 12.5 g/dL (ref 12.0–15.0)
MCH: 28 pg (ref 26.0–34.0)
MCHC: 33.2 g/dL (ref 30.0–36.0)
MCV: 84.1 fL (ref 78.0–100.0)
Platelets: 300 10*3/uL (ref 150–400)
RBC: 4.47 MIL/uL (ref 3.87–5.11)
RDW: 14.4 % (ref 11.5–15.5)
WBC: 8.8 10*3/uL (ref 4.0–10.5)

## 2017-03-14 LAB — HCG, QUANTITATIVE, PREGNANCY: HCG, BETA CHAIN, QUANT, S: 994 m[IU]/mL — AB (ref <5)

## 2017-03-14 MED ORDER — OXYCODONE HCL 5 MG PO TABS: 5.0000 mg | ORAL_TABLET | Freq: Four times a day (QID) | ORAL | 0 refills | Status: DC | PRN

## 2017-03-14 MED ORDER — IBUPROFEN 600 MG PO TABS: 600.0000 mg | ORAL_TABLET | Freq: Four times a day (QID) | ORAL | 1 refills | Status: DC | PRN

## 2017-03-14 MED ORDER — TRAMADOL HCL 50 MG PO TABS
100.0000 mg | ORAL_TABLET | Freq: Once | ORAL | Status: AC
  Administered 2017-03-14: 100 mg via ORAL
  Filled 2017-03-14: qty 2

## 2017-03-14 NOTE — MAU Note (Signed)
Patient presents to MAU with c/o left abdominal pain. Pain started around 1900- states she took prescribed oxycodone and has not helped the pain. Has schedule appt tomorrow at 1100 for lab work. Patient received methotrexate on 8/3. Patient has continued bleeding for last month.
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
## 2017-03-14 NOTE — Discharge Instructions (Signed)
Ectopic Pregnancy An ectopic pregnancy is when the fertilized egg attaches (implants) outside the uterus. Most ectopic pregnancies occur in one of the tubes where eggs travel from the ovary to the uterus (fallopian tubes), but the implanting can occur in other locations. In rare cases, ectopic pregnancies occur on the ovary, intestine, pelvis, abdomen, or cervix. In an ectopic pregnancy, the fertilized egg does not have the ability to develop into a normal, healthy baby. A ruptured ectopic pregnancy is one in which tearing or bursting of a fallopian tube causes internal bleeding. Often, there is intense lower abdominal pain, and vaginal bleeding sometimes occurs. Having an ectopic pregnancy can be life-threatening. If this dangerous condition is not treated, it can lead to blood loss, shock, or even death. What are the causes? The most common cause of this condition is damage to one of the fallopian tubes. A fallopian tube may be narrowed or blocked, and that keeps the fertilized egg from reaching the uterus. What increases the risk? This condition is more likely to develop in women of childbearing age who have different levels of risk. The levels of risk can be divided into three categories. High risk  You have gone through infertility treatment.  You have had an ectopic pregnancy before.  You have had surgery on the fallopian tubes, or another surgical procedure, such as an abortion.  You have had surgery to have the fallopian tubes tied (tubal ligation).  You have problems or diseases of the fallopian tubes.  You have been exposed to diethylstilbestrol (DES). This medicine was used until 1971, and it had effects on babies whose mothers took the medicine.  You become pregnant while using an IUD (intrauterine device) for birth control. Moderate risk  You have a history of infertility.  You have had an STI (sexually transmitted infection).  You have a history of pelvic inflammatory  disease (PID).  You have scarring from endometriosis.  You have multiple sexual partners.  You smoke. Low risk  You have had pelvic surgery.  You use vaginal douches.  You became sexually active before age 18. What are the signs or symptoms? Common symptoms of this condition include normal pregnancy symptoms, such as missing a period, nausea, tiredness, abdominal pain, breast tenderness, and bleeding. However, ectopic pregnancy will have additional symptoms, such as:  Pain with intercourse.  Irregular vaginal bleeding or spotting.  Cramping or pain on one side or in the lower abdomen.  Fast heartbeat, low blood pressure, and sweating.  Passing out while having a bowel movement.  Symptoms of a ruptured ectopic pregnancy and internal bleeding may include:  Sudden, severe pain in the abdomen and pelvis.  Dizziness, weakness, light-headedness, or fainting.  Pain in the shoulder or neck area.  How is this diagnosed? This condition is diagnosed by:  A pelvic exam to locate pain or a mass in the abdomen.  A pregnancy test. This blood test checks for the presence as well as the specific level of pregnancy hormone in the bloodstream.  Ultrasound. This is performed if a pregnancy test is positive. In this test, a probe is inserted into the vagina. The probe will detect a fetus, possibly in a location other than the uterus.  Taking a sample of uterus tissue (dilation and curettage, or D&C).  Surgery to perform a visual exam of the inside of the abdomen using a thin, lighted tube that has a tiny camera on the end (laparoscope).  Culdocentesis. This procedure involves inserting a needle at the top   of the vagina, behind the uterus. If blood is present in this area, it may indicate that a fallopian tube is torn.  How is this treated? This condition is treated with medicine or surgery. Medicine  An injection of a medicine (methotrexate) may be given to cause the pregnancy tissue  to be absorbed. This medicine may save your fallopian tube. It may be given if: ? The diagnosis is made early, with no signs of active bleeding. ? The fallopian tube has not ruptured. ? You are considered to be a good candidate for the medicine. Usually, pregnancy hormone blood levels are checked after methotrexate treatment. This is to be sure that the medicine is effective. It may take 4-6 weeks for the pregnancy to be absorbed. Most pregnancies will be absorbed by 3 weeks. Surgery  A laparoscope may be used to remove the pregnancy tissue.  If severe internal bleeding occurs, a larger cut (incision) may be made in the lower abdomen (laparotomy) to remove the fetus and placenta. This is done to stop the bleeding.  Part or all of the fallopian tube may be removed (salpingectomy) along with the fetus and placenta. The fallopian tube may also be repaired during the surgery.  In very rare circumstances, removal of the uterus (hysterectomy) may be required.  After surgery, pregnancy hormone testing may be done to be sure that there is no pregnancy tissue left. Whether your treatment is medicine or surgery, you may receive a Rho (D) immune globulin shot to prevent problems with any future pregnancy. This shot may be given if:  You are Rh-negative and the baby's father is Rh-positive.  You are Rh-negative and you do not know the Rh type of the baby's father.  Follow these instructions at home:  Rest and limit your activity after the procedure for as long as told by your health care provider.  Until your health care provider says that it is safe: ? Do not lift anything that is heavier than 10 lb (4.5 kg), or the limit that your health care provider tells you. ? Avoid physical exercise and any movement that requires effort (is strenuous).  To help prevent constipation: ? Eat a healthy diet that includes fruits, vegetables, and whole grains. ? Drink 6-8 glasses of water per day. Get help  right away if:  You develop worsening pain that is not relieved by medicine.  You have: ? A fever or chills. ? Vaginal bleeding. ? Redness and swelling at the incision site. ? Nausea and vomiting.  You feel dizzy or weak.  You feel light-headed or you faint. This information is not intended to replace advice given to you by your health care provider. Make sure you discuss any questions you have with your health care provider. Document Released: 08/23/2004 Document Revised: 03/14/2016 Document Reviewed: 02/15/2016 Elsevier Interactive Patient Education  2018 [employer]  

## 2017-03-18 ENCOUNTER — Encounter (HOSPITAL_COMMUNITY)
Admission: AD | Disposition: A | Discharge status: Home / Self Care | Payer: Self-pay | Source: Ambulatory Visit | Attending: Obstetrics and Gynecology

## 2017-03-18 ENCOUNTER — Inpatient Hospital Stay (HOSPITAL_COMMUNITY): Payer: 59 | Admitting: Anesthesiology

## 2017-03-18 ENCOUNTER — Ambulatory Visit (HOSPITAL_COMMUNITY)
Admission: AD | Admit: 2017-03-18 | Discharge: 2017-03-19 | Disposition: A | Discharge status: Home / Self Care | Payer: 59 | Source: Ambulatory Visit | Attending: Obstetrics and Gynecology | Admitting: Obstetrics and Gynecology

## 2017-03-18 ENCOUNTER — Encounter (HOSPITAL_COMMUNITY): Payer: Self-pay | Admitting: *Deleted

## 2017-03-18 ENCOUNTER — Encounter (HOSPITAL_COMMUNITY): Payer: Self-pay | Admitting: Certified Registered Nurse Anesthetist

## 2017-03-18 ENCOUNTER — Inpatient Hospital Stay (HOSPITAL_COMMUNITY): Payer: 59

## 2017-03-18 DIAGNOSIS — Z79891 Long term (current) use of opiate analgesic: Secondary | ICD-10-CM | POA: Insufficient documentation

## 2017-03-18 DIAGNOSIS — O00102 Left tubal pregnancy without intrauterine pregnancy: Secondary | ICD-10-CM | POA: Insufficient documentation

## 2017-03-18 DIAGNOSIS — R109 Unspecified abdominal pain: Secondary | ICD-10-CM

## 2017-03-18 DIAGNOSIS — O26899 Other specified pregnancy related conditions, unspecified trimester: Secondary | ICD-10-CM

## 2017-03-18 HISTORY — PX: LAPAROSCOPY: SHX197

## 2017-03-18 LAB — CBC
HCT: 38.1 % (ref 36.0–46.0)
HEMOGLOBIN: 12.6 g/dL (ref 12.0–15.0)
MCH: 28.1 pg (ref 26.0–34.0)
MCHC: 33.1 g/dL (ref 30.0–36.0)
MCV: 85 fL (ref 78.0–100.0)
Platelets: 331 10*3/uL (ref 150–400)
RBC: 4.48 MIL/uL (ref 3.87–5.11)
RDW: 14.2 % (ref 11.5–15.5)
WBC: 8.9 10*3/uL (ref 4.0–10.5)

## 2017-03-18 LAB — HCG, QUANTITATIVE, PREGNANCY: HCG, BETA CHAIN, QUANT, S: 697 m[IU]/mL — AB (ref <5)

## 2017-03-18 SURGERY — LAPAROSCOPY OPERATIVE
Anesthesia: General

## 2017-03-18 SURGERY — LAPAROSCOPY OPERATIVE
Anesthesia: General | Site: Abdomen

## 2017-03-18 MED ORDER — LIDOCAINE HCL (CARDIAC) 20 MG/ML IV SOLN
INTRAVENOUS | Status: DC | PRN
  Administered 2017-03-18: 100 mg via INTRAVENOUS

## 2017-03-18 MED ORDER — DEXAMETHASONE SODIUM PHOSPHATE 4 MG/ML IJ SOLN
INTRAMUSCULAR | Status: AC
  Filled 2017-03-18: qty 1

## 2017-03-18 MED ORDER — SUGAMMADEX SODIUM 200 MG/2ML IV SOLN
INTRAVENOUS | Status: AC
  Filled 2017-03-18: qty 2

## 2017-03-18 MED ORDER — ONDANSETRON HCL 4 MG/2ML IJ SOLN
INTRAMUSCULAR | Status: DC | PRN
  Administered 2017-03-18: 4 mg via INTRAVENOUS

## 2017-03-18 MED ORDER — PROPOFOL 10 MG/ML IV BOLUS
INTRAVENOUS | Status: DC | PRN
  Administered 2017-03-18: 200 mg via INTRAVENOUS

## 2017-03-18 MED ORDER — VASOPRESSIN 20 UNIT/ML IV SOLN
INTRAVENOUS | Status: DC | PRN
  Administered 2017-03-18: 20 [IU]

## 2017-03-18 MED ORDER — ACETAMINOPHEN 10 MG/ML IV SOLN
INTRAVENOUS | Status: AC
  Filled 2017-03-18: qty 100

## 2017-03-18 MED ORDER — BUPIVACAINE HCL (PF) 0.25 % IJ SOLN
INTRAMUSCULAR | Status: DC | PRN
  Administered 2017-03-18: 30 mL

## 2017-03-18 MED ORDER — FENTANYL CITRATE (PF) 250 MCG/5ML IJ SOLN
INTRAMUSCULAR | Status: AC
  Filled 2017-03-18: qty 5

## 2017-03-18 MED ORDER — FENTANYL CITRATE (PF) 100 MCG/2ML IJ SOLN
INTRAMUSCULAR | Status: DC | PRN
  Administered 2017-03-18: 50 ug via INTRAVENOUS
  Administered 2017-03-18: 100 ug via INTRAVENOUS
  Administered 2017-03-18 (×2): 50 ug via INTRAVENOUS

## 2017-03-18 MED ORDER — SUGAMMADEX SODIUM 200 MG/2ML IV SOLN
INTRAVENOUS | Status: DC | PRN
  Administered 2017-03-18: 369.2 mg via INTRAVENOUS

## 2017-03-18 MED ORDER — ROCURONIUM BROMIDE 100 MG/10ML IV SOLN
INTRAVENOUS | Status: DC | PRN
  Administered 2017-03-18: 50 mg via INTRAVENOUS

## 2017-03-18 MED ORDER — OXYCODONE HCL 5 MG PO TABS
5.0000 mg | ORAL_TABLET | Freq: Once | ORAL | Status: AC | PRN
  Administered 2017-03-18: 5 mg via ORAL

## 2017-03-18 MED ORDER — SOD CITRATE-CITRIC ACID 500-334 MG/5ML PO SOLN
30.0000 mL | Freq: Once | ORAL | Status: AC
  Administered 2017-03-18: 30 mL via ORAL
  Filled 2017-03-18: qty 15

## 2017-03-18 MED ORDER — SODIUM CHLORIDE 0.9 % IR SOLN
Status: DC | PRN
  Administered 2017-03-18: 3000 mL

## 2017-03-18 MED ORDER — GLYCOPYRROLATE 0.2 MG/ML IJ SOLN
INTRAMUSCULAR | Status: DC | PRN
  Administered 2017-03-18: 0.1 mg via INTRAVENOUS

## 2017-03-18 MED ORDER — ACETAMINOPHEN 500 MG PO TABS
1000.0000 mg | ORAL_TABLET | Freq: Once | ORAL | Status: AC
  Administered 2017-03-18: 1000 mg via ORAL
  Filled 2017-03-18: qty 2

## 2017-03-18 MED ORDER — ROCURONIUM BROMIDE 100 MG/10ML IV SOLN
INTRAVENOUS | Status: AC
  Filled 2017-03-18: qty 1

## 2017-03-18 MED ORDER — KETOROLAC TROMETHAMINE 30 MG/ML IJ SOLN
INTRAMUSCULAR | Status: AC
  Filled 2017-03-18: qty 1

## 2017-03-18 MED ORDER — LIDOCAINE HCL (CARDIAC) 20 MG/ML IV SOLN
INTRAVENOUS | Status: AC
  Filled 2017-03-18: qty 5

## 2017-03-18 MED ORDER — FAMOTIDINE IN NACL 20-0.9 MG/50ML-% IV SOLN
20.0000 mg | Freq: Once | INTRAVENOUS | Status: AC
  Administered 2017-03-18: 20 mg via INTRAVENOUS
  Filled 2017-03-18: qty 50

## 2017-03-18 MED ORDER — SUCCINYLCHOLINE CHLORIDE 20 MG/ML IJ SOLN
INTRAMUSCULAR | Status: DC | PRN
  Administered 2017-03-18: 120 mg via INTRAVENOUS

## 2017-03-18 MED ORDER — MIDAZOLAM HCL 2 MG/2ML IJ SOLN
INTRAMUSCULAR | Status: DC | PRN
  Administered 2017-03-18: 2 mg via INTRAVENOUS

## 2017-03-18 MED ORDER — PROPOFOL 10 MG/ML IV BOLUS
INTRAVENOUS | Status: AC
  Filled 2017-03-18: qty 20

## 2017-03-18 MED ORDER — LACTATED RINGERS IV BOLUS (SEPSIS)
1000.0000 mL | Freq: Once | INTRAVENOUS | Status: AC
  Administered 2017-03-18: 1000 mL via INTRAVENOUS

## 2017-03-18 MED ORDER — LACTATED RINGERS IV SOLN
INTRAVENOUS | Status: DC
  Administered 2017-03-18 (×2): via INTRAVENOUS

## 2017-03-18 MED ORDER — ONDANSETRON HCL 4 MG/2ML IJ SOLN
INTRAMUSCULAR | Status: AC
  Filled 2017-03-18: qty 2

## 2017-03-18 MED ORDER — MIDAZOLAM HCL 2 MG/2ML IJ SOLN
INTRAMUSCULAR | Status: AC
  Filled 2017-03-18: qty 2

## 2017-03-18 MED ORDER — OXYCODONE HCL 5 MG PO TABS
ORAL_TABLET | ORAL | Status: AC
  Filled 2017-03-18: qty 1

## 2017-03-18 MED ORDER — DEXAMETHASONE SODIUM PHOSPHATE 10 MG/ML IJ SOLN
INTRAMUSCULAR | Status: DC | PRN
  Administered 2017-03-18: 10 mg via INTRAVENOUS

## 2017-03-18 MED ORDER — HYDROMORPHONE HCL 1 MG/ML IJ SOLN: 0.2500 mg | INTRAMUSCULAR | Status: DC | PRN

## 2017-03-18 MED ORDER — ACETAMINOPHEN 10 MG/ML IV SOLN
1000.0000 mg | Freq: Once | INTRAVENOUS | Status: AC
  Administered 2017-03-18: 1000 mg via INTRAVENOUS

## 2017-03-18 MED ORDER — OXYCODONE HCL 5 MG/5ML PO SOLN: 5.0000 mg | Freq: Once | ORAL | Status: AC | PRN

## 2017-03-18 MED ORDER — ONDANSETRON HCL 4 MG/2ML IJ SOLN: 4.0000 mg | Freq: Four times a day (QID) | INTRAMUSCULAR | Status: DC | PRN

## 2017-03-18 SURGICAL SUPPLY — 39 items
ADH SKN CLS APL DERMABOND .7 (GAUZE/BANDAGES/DRESSINGS) ×1
APPLICATOR COTTON TIP 6IN STRL (MISCELLANEOUS) ×1 IMPLANT
BAG SPEC RTRVL LRG 6X4 10 (ENDOMECHANICALS)
BARRIER ADHS 3X4 INTERCEED (GAUZE/BANDAGES/DRESSINGS) IMPLANT
BRR ADH 4X3 ABS CNTRL BYND (GAUZE/BANDAGES/DRESSINGS)
CABLE HIGH FREQUENCY MONO STRZ (ELECTRODE) ×2 IMPLANT
CLOTH BEACON ORANGE TIMEOUT ST (SAFETY) ×3 IMPLANT
DERMABOND ADVANCED (GAUZE/BANDAGES/DRESSINGS) ×2
DERMABOND ADVANCED .7 DNX12 (GAUZE/BANDAGES/DRESSINGS) ×1 IMPLANT
DISSECTOR BLUNT TIP ENDO 5MM (MISCELLANEOUS) IMPLANT
DRSG OPSITE POSTOP 3X4 (GAUZE/BANDAGES/DRESSINGS) ×2 IMPLANT
DURAPREP 26ML APPLICATOR (WOUND CARE) ×3 IMPLANT
FORCEPS CUTTING 33CM 5MM (CUTTING FORCEPS) IMPLANT
FORCEPS CUTTING 45CM 5MM (CUTTING FORCEPS) IMPLANT
GLOVE BIOGEL PI IND STRL 7.0 (GLOVE) ×4 IMPLANT
GLOVE BIOGEL PI INDICATOR 7.0 (GLOVE) ×8
GLOVE ECLIPSE 6.5 STRL STRAW (GLOVE) ×3 IMPLANT
GOWN STRL REUS W/TWL LRG LVL3 (GOWN DISPOSABLE) ×10 IMPLANT
NDL SPNL 18GX3.5 QUINCKE PK (NEEDLE) IMPLANT
NDL SPNL 22GX7 QUINCKE BK (NEEDLE) IMPLANT
NEEDLE SPNL 18GX3.5 QUINCKE PK (NEEDLE) ×3 IMPLANT
NEEDLE SPNL 22GX7 QUINCKE BK (NEEDLE) ×3 IMPLANT
PACK LAPAROSCOPY BASIN (CUSTOM PROCEDURE TRAY) ×3 IMPLANT
PACK TRENDGUARD 450 HYBRID PRO (MISCELLANEOUS) IMPLANT
PACK TRENDGUARD 600 HYBRD PROC (MISCELLANEOUS) IMPLANT
POUCH SPECIMEN RETRIEVAL 10MM (ENDOMECHANICALS) IMPLANT
PROTECTOR NERVE ULNAR (MISCELLANEOUS) ×6 IMPLANT
SCISSORS LAP 5X35 DISP (ENDOMECHANICALS) IMPLANT
SET IRRIG TUBING LAPAROSCOPIC (IRRIGATION / IRRIGATOR) ×2 IMPLANT
SLEEVE XCEL OPT CAN 5 100 (ENDOMECHANICALS) ×3 IMPLANT
SOLUTION ELECTROLUBE (MISCELLANEOUS) IMPLANT
SUT MNCRL AB 3-0 PS2 27 (SUTURE) ×3 IMPLANT
SUT VICRYL 0 UR6 27IN ABS (SUTURE) ×6 IMPLANT
TOWEL OR 17X24 6PK STRL BLUE (TOWEL DISPOSABLE) ×6 IMPLANT
TRAY FOLEY CATH SILVER 14FR (SET/KITS/TRAYS/PACK) ×3 IMPLANT
TRENDGUARD 450 HYBRID PRO PACK (MISCELLANEOUS) ×3
TRENDGUARD 600 HYBRID PROC PK (MISCELLANEOUS)
TROCAR BALLN 12MMX100 BLUNT (TROCAR) ×3 IMPLANT
TROCAR XCEL NON-BLD 5MMX100MML (ENDOMECHANICALS) ×3 IMPLANT

## 2017-03-18 NOTE — Transfer of Care (Signed)
Immediate Anesthesia Transfer of Care Note  Patient: Kristin Lyons  Procedure(s) Performed: Procedure(s): LAPAROSCOPY OPERATIVE (N/A)  Patient Location: PACU  Anesthesia Type:General  Level of Consciousness: awake, alert  and oriented  Airway & Oxygen Therapy: Patient Spontanous Breathing and Patient connected to nasal cannula oxygen  Post-op Assessment: Report given to RN, Post -op Vital signs reviewed and stable and Patient moving all extremities  Post vital signs: Reviewed and stable  Last Vitals:  Vitals:   03/18/17 1509 03/18/17 1510  BP:  132/69  Pulse: 78   Resp: 16   SpO2: 100%     Last Pain:  Vitals:   03/18/17 1823  PainSc: 6          Complications: No apparent anesthesia complications

## 2017-03-18 NOTE — Discharge Instructions (Signed)
POST-OPERATIVE INSTRUCTIONS TO PATIENT  Call [facility]  [phone])  for excessive pain, bleeding or temperature greater than or equal to 100.4 degrees (orally).    No driving for 1 weeks No lifting (more than 20 lbs) for 1 week No sexual activity for 6 weeks  Pain management:  Use Ibuprofen 600 mg every 6 hours for 5 days and then as needed. Use your pain medication as needed to maintain a pain level at or below 3/10 Use Colace 1-2 capsules per day as long as you are using pain  medication to avoid constipation.       Diet: normal  Bathing: may shower day after surgery  Wound Care: keep incisions clean and dry  Return to Dr. Estanislado Pandy: the office will call you to schedule your follow-up blood work and your follow-up appointment  Return to work: 03/25/17    [facility] A MD 03/18/2017 10:41 PM     General Anesthesia, Adult, Care After These instructions provide you with information about caring for yourself after your procedure. Your health care provider may also give you more specific instructions. Your treatment has been planned according to current medical practices, but problems sometimes occur. Call your health care provider if you have any problems or questions after your procedure. What can I expect after the procedure? After the procedure, it is common to have:  Vomiting.  A sore throat.  Mental slowness.  It is common to feel:  Nauseous.  Cold or shivery.  Sleepy.  Tired.  Sore or achy, even in parts of your body where you did not have surgery.  Follow these instructions at home: For at least 24 hours after the procedure:  Do not: ? Participate in activities where you could fall or become injured. ? Drive. ? Use heavy machinery. ? Drink alcohol. ? Take sleeping pills or medicines that cause drowsiness. ? Make important decisions or sign legal documents. ? Take care of children on your own.  Rest. Eating and drinking  If you  vomit, drink water, juice, or soup when you can drink without vomiting.  Drink enough fluid to keep your urine clear or pale yellow.  Make sure you have little or no nausea before eating solid foods.  Follow the diet recommended by your health care provider. General instructions  Have a responsible adult stay with you until you are awake and alert.  Return to your normal activities as told by your health care provider. Ask your health care provider what activities are safe for you.  Take over-the-counter and prescription medicines only as told by your health care provider.  If you smoke, do not smoke without supervision.  Keep all follow-up visits as told by your health care provider. This is important. Contact a health care provider if:  You continue to have nausea or vomiting at home, and medicines are not helpful.  You cannot drink fluids or start eating again.  You cannot urinate after 8-12 hours.  You develop a skin rash.  You have fever.  You have increasing redness at the site of your procedure. Get help right away if:  You have difficulty breathing.  You have chest pain.  You have unexpected bleeding.  You feel that you are having a life-threatening or urgent problem. This information is not intended to replace advice given to you by your health care provider. Make sure you discuss any questions you have with your health care provider. Document Released: 10/22/2000 Document Revised: 12/19/2015 Document Reviewed: 06/30/2015 Elsevier  Interactive Patient Education  [employer].

## 2017-03-18 NOTE — MAU Note (Signed)
Pt received MTX on August 3, labs have been followed @ MD office, pt states quants have been dropping slowly.  Pt came to MAU on Thursday night due to abd pain, received oxycodone but doesn't like to take it because it makes her itch.  Pt states her pain is worse today, is vomiting & feeling dizzy.  Pt has changed 3 panti liners today, not completely saturated.

## 2017-03-18 NOTE — MAU Note (Signed)
Urine in lab 

## 2017-03-18 NOTE — MAU Provider Note (Signed)
History     CSN: [id number]  Arrival date and time: 03/18/17 1425   First Provider Initiated Contact with Patient 03/18/17 1708      Chief Complaint  Patient presents with  . Abdominal Pain  . Emesis   HPI   Ms.Kristin Lyons is a 26 y.o. female G54P1011 with a known ectopic pregnancy here in MAU with worsening abdominal pain. States it feels like something is stabbing her on the left side. States her pain became worse this morning before she came in.  States the pain was so bad that she was not able to walk earlier today. Has Oxycodone at home however did not take it because it makes her itch. States she has been vomiting.  Currently rates her pain 8/10.   OB History    Gravida Para Term Preterm AB Living   4 2 1  0 1 1   SAB TAB Ectopic Multiple Live Births   1 0 0 0 1      Past Medical History:  Diagnosis Date  . Asthma   . Asthma   . Bacterial infection   . History of chicken pox   . Trichomonas   . Yeast infection     Past Surgical History:  Procedure Laterality Date  . DILATION AND CURETTAGE OF UTERUS    . DILATION AND CURETTAGE OF UTERUS N/A 09/17/2014   Procedure: DILATATION AND CURETTAGE;  Surgeon: Purcell Nails, MD;  Location: [facility];  Service: Gynecology;  Laterality: N/A;  . DILATION AND EVACUATION N/A 09/17/2014   Procedure: DILATATION AND EVACUATION;  Surgeon: Purcell Nails, MD;  Location: [facility];  Service: Gynecology;  Laterality: N/A;  . ORIF ORBITAL FRACTURE Left 06/13/2016   Procedure: OPEN REDUCTION INTERNAL FIXATION (ORIF) ORBITAL FRACTURE;  Surgeon: Alena Bills Dillingham, DO;  Location: [facility];  Service: Plastics;  Laterality: Left;    Family History  Problem Relation Age of Onset  . Hypertension Maternal Uncle   . Hypertension Paternal Uncle     Social History  Substance Use Topics  . Smoking status: Never Smoker  . Smokeless tobacco: Never Used  . Alcohol use No    Allergies:  Allergies  Allergen Reactions  . Shellfish Allergy  Hives  . Pollen Extract Other (See Comments)    Reaction:  Cold symptoms     Prescriptions Prior to Admission  Medication Sig Dispense Refill Last Dose  . ibuprofen (ADVIL,MOTRIN) 600 MG tablet Take 1 tablet (600 mg total) by mouth every 6 (six) hours as needed. 30 tablet 1   . oxyCODONE (ROXICODONE) 5 MG immediate release tablet Take 1 tablet (5 mg total) by mouth every 4 (four) hours as needed for severe pain. 18 tablet 0   . oxyCODONE (ROXICODONE) 5 MG immediate release tablet Take 1 tablet (5 mg total) by mouth every 6 (six) hours as needed. 14 tablet 0    Results for orders placed or performed during the hospital encounter of 03/18/17 (from the past 48 hour(s))  hCG, quantitative, pregnancy     Status: Abnormal   Collection Time: 03/18/17  5:14 PM  Result Value Ref Range   hCG, Beta Chain, Quant, S 697 (H) <5 mIU/mL    Comment:          GEST. AGE      CONC.  (mIU/mL)   <=1 WEEK        5 - 50     2 WEEKS       50 - 500  3 WEEKS       100 - 10,000     4 WEEKS     1,000 - 30,000     5 WEEKS     3,500 - 115,000   6-8 WEEKS     12,000 - 270,000    12 WEEKS     15,000 - 220,000        FEMALE AND NON-PREGNANT FEMALE:     LESS THAN 5 mIU/mL   CBC     Status: None   Collection Time: 03/18/17  5:14 PM  Result Value Ref Range   WBC 8.9 4.0 - 10.5 K/uL   RBC 4.48 3.87 - 5.11 MIL/uL   Hemoglobin 12.6 12.0 - 15.0 g/dL   HCT 16.1 09.6 - 04.5 %   MCV 85.0 78.0 - 100.0 fL   MCH 28.1 26.0 - 34.0 pg   MCHC 33.1 30.0 - 36.0 g/dL   RDW 40.9 81.1 - 91.4 %   Platelets 331 150 - 400 K/uL   US Ob Transvaginal  Result Date: 03/18/2017 CLINICAL DATA:  Abdominal pain since left ectopic pregnancy, declining beta HCG, received methotrexate EXAM: TRANSVAGINAL OB ULTRASOUND TECHNIQUE: Transvaginal ultrasound was performed for complete evaluation of the gestation as well as the maternal uterus, adnexal regions, and pelvic cul-de-sac. COMPARISON:  03/05/2017 FINDINGS: Intrauterine gestational sac:  None Yolk sac:  Not Visualized. Embryo:  Not Visualized. Cardiac Activity: Not Visualized. Subchorionic hemorrhage:  None visualized. Maternal uterus/adnexae: Endometrial complex measures 5 mm. Right ovary is within normal limits, measuring 3.1 x 2.5 x 2.6 cm. Left ovary measures 3.7 x 1.8 x 2.0 cm. Adjacent 4.3 x 2.3 x 3.6 cm left adnexal mass, previously 2.5 x 1.5 x 1.9 cm. Small volume simple pelvic ascites. IMPRESSION: No IUP is visualized in this patient with known left adnexal ectopic pregnancy. 4.3 cm left adnexal mass, corresponding to known ectopic pregnancy, previously measuring 2.5 cm. Small volume simple pelvic ascites. Electronically Signed   By: Charline Bills M.D.   On: 03/18/2017 18:26   Review of Systems  Gastrointestinal: Positive for abdominal pain, nausea and vomiting.  Genitourinary: Positive for vaginal bleeding.   Physical Exam   Blood pressure 132/69, pulse 78, resp. rate 16, last menstrual period 01/16/2017, SpO2 100 %.  Physical Exam  Constitutional: She is oriented to person, place, and time. She appears well-developed and well-nourished. No distress.  HENT:  Head: Normocephalic.  GI: There is tenderness in the suprapubic area and left lower quadrant. There is rigidity. There is no rebound and no guarding.  Musculoskeletal: Normal range of motion.  Neurological: She is alert and oriented to person, place, and time.  Skin: Skin is warm. She is not diaphoretic.  Psychiatric: Her behavior is normal.    MAU Course  Procedures  None  MDM  Tylenol 1 gram PO given CBC & Quant [place] Discussed results with Dr. Estanislado Pandy, increase in size of ectopic.  Dr. Estanislado Pandy to MAU to discuss with patient. Patient to be NPO O positive blood type. Assessment and Plan   A:  1. Abdominal pain in pregnancy, antepartum   2.      Adnexal mass    P:  Dr. Estanislado Pandy to resume care of the patient   Sidrah Harden, Harolyn Rutherford, NP 03/18/2017 7:59 PM
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
## 2017-03-18 NOTE — Progress Notes (Signed)
Pt was reclined in bed when I arrived. She was alert and awake. She said she was ok and did not need anything. I offered additional assistance if she needs it. She was appreciative of visit. Please page if further assistance is needed. Chaplain Elmarie Shiley [place], [place]   03/18/17 2000  Clinical Encounter Type  Visited With Patient

## 2017-03-18 NOTE — Op Note (Signed)
Preoperative diagnosis: Suspected left ectopic pregnancy  Postoperative diagnosis: Left tubal pregnancy  Anesthesia: General  Anesthesiologist: Dr. Chaney Malling  Procedure: Laparoscopic cure of left tubal pregnancy  Surgeon: Dr. Dois Davenport Jaymie Misch  Assistant: Gerrit Heck CNM  Estimated blood loss: Minimal  Procedure:  After being informed of the planned procedure with possible complications including bleeding, infection, injury to other organs, possible salpyngectomy,possible laparotomy,  informed consent is obtained and the patient is taken to OR # 4. She is placed in lithotomy position, prepped and draped in a sterile fashion, and a Foley catheter is inserted in her bladder.  Pelvic exam: Normal external genitalia, normal cervix, normal uterus, normal adnexa  A speculum is inserted in the vagina and the anterior lip of the cervix was grasped with tenaculum forcep. An acorn intrauterine manipulator is easily positioned and the speculum is removed.  We infiltrate the umbilical area using 10 cc of Marcaine 0.25 and perform a semi-elliptical incision which is brought down bluntly to the fascia. The fascia is identified and grasped with Coker forceps. The fascia is opened with Mayo scissors. Peritoneum is entered bluntly. A pursestring suture of 0 Vicryl is placed on the fascia. A 10 mm Hassan trocar is easily inserted in the abdominal cavity and is held in place both by the inflated trocar balloon and the previously placed pursestring suture. This allows for easy insufflation of the pneumoperitoneum using warm CO2 at a maximum pressure of 15 mmHg. A operative laparoscope is inserted in the abdominal cavity.  Observation: Anterior cul-de-sac is normal, posterior cul-de-sac is filled with clots, uterus is normal. Right tube is normal, left tube has a unruptured 4 cm ectopic pregnancy. Appendix is visualized and normal. Liver is visualized and normal.   We proceed with placement of two 5 mm trocars  under direct visualization after infiltrating each site with 10 cc of Marcaine 0.25 %, one in the left lower quadrant and one in the suprapubic area.  We then infiltrate the tube's mesosalpynx with Vasopressine diluted 20 units in 50 cc of Saline. 6 cc was used.  Using bipolar energy, the tube is incised longitudinally with a hot needle and the ectopic pregnancy is extruded using irrigation. We remove the specimen through the 10 mm trocar. We irrigate profusely and note a satisfactory hemostasis.  All instruments are then removed after evacuating the pneumoperitoneum. The fascia of the umbilical incision is closed with the purse string suture of 0 Vicryl. The skin of all incisions is closed with a subcuticular suture of 3-0 Monocryl and Dermabond.  Instrument and sponge count is complete x2. Estimated blood loss is minimal.EXISTING PNEUMOPERITONEUM IS EVALUATED AT 500 CC.The procedure is well tolerated by the patient is taken to recovery room in a well and stable condition.    Specimen: tubal pregnancy sent to pathology   [facility] A  MD

## 2017-03-18 NOTE — MAU Note (Signed)
Labs drawn with IV start @ 1940.

## 2017-03-18 NOTE — Anesthesia Procedure Notes (Signed)
Procedure Name: Intubation Date/Time: 03/18/2017 9:12 PM Performed by: Hewitt Blade Pre-anesthesia Checklist: Patient identified, Emergency Drugs available, Suction available and Patient being monitored Patient Re-evaluated:Patient Re-evaluated prior to induction Oxygen Delivery Method: Circle system utilized Preoxygenation: Pre-oxygenation with 100% oxygen Induction Type: IV induction, Rapid sequence and Cricoid Pressure applied Laryngoscope Size: Mac and 3 Grade View: Grade II Tube type: Oral Tube size: 7.0 mm Number of attempts: 1 Airway Equipment and Method: Stylet Placement Confirmation: ETT inserted through vocal cords under direct vision,  positive ETCO2 and breath sounds checked- equal and bilateral Secured at: 21 cm Tube secured with: Tape Dental Injury: Teeth and Oropharynx as per pre-operative assessment

## 2017-03-18 NOTE — Anesthesia Preprocedure Evaluation (Signed)
Anesthesia Evaluation  Patient identified by MRN, date of birth, ID band Patient awake    Reviewed: Allergy & Precautions, H&P , NPO status , Patient's Chart, lab work & pertinent test results  Airway Mallampati: II   Neck ROM: full    Dental   Pulmonary asthma ,    breath sounds clear to auscultation       Cardiovascular negative cardio ROS   Rhythm:regular Rate:Normal     Neuro/Psych    GI/Hepatic   Endo/Other  obese  Renal/GU      Musculoskeletal   Abdominal   Peds  Hematology   Anesthesia Other Findings   Reproductive/Obstetrics                             Anesthesia Physical Anesthesia Plan  ASA: II  Anesthesia Plan: General   Post-op Pain Management:    Induction: Intravenous  PONV Risk Score and Plan: 3 and Ondansetron, Dexamethasone, Midazolam and Treatment may vary due to age or medical condition  Airway Management Planned: Oral ETT  Additional Equipment:   Intra-op Plan:   Post-operative Plan: Extubation in OR  Informed Consent: I have reviewed the patients History and Physical, chart, labs and discussed the procedure including the risks, benefits and alternatives for the proposed anesthesia with the patient or authorized representative who has indicated his/her understanding and acceptance.     Plan Discussed with: CRNA, Anesthesiologist and Surgeon  Anesthesia Plan Comments:         Anesthesia Quick Evaluation

## 2017-03-18 NOTE — MAU Note (Signed)
Pt feeling more abdominal pain 10/10. Had some vomiting and dizziness.

## 2017-03-19 ENCOUNTER — Encounter (HOSPITAL_COMMUNITY): Payer: Self-pay | Admitting: Obstetrics and Gynecology

## 2017-03-19 NOTE — Anesthesia Postprocedure Evaluation (Signed)
Anesthesia Post Note  Patient: Kristin Lyons  Procedure(s) Performed: Procedure(s) (LRB): LAPAROSCOPY OPERATIVE WITH REMOVAL LEFT ECTOPIC PREGNANCY (N/A)     Patient location during evaluation: PACU Anesthesia Type: General Level of consciousness: awake and alert and patient cooperative Pain management: pain level controlled Vital Signs Assessment: post-procedure vital signs reviewed and stable Respiratory status: spontaneous breathing and respiratory function stable Cardiovascular status: stable Anesthetic complications: no    Last Vitals:  Vitals:   03/18/17 2315 03/19/17 0006  BP: (!) 151/96 (!) 141/87  Pulse: 74 80  Resp: (!) 22 15  Temp:  36.9 C  SpO2: 94% 95%    Last Pain:  Vitals:   03/19/17 0006  PainSc: 10-Worst pain ever   Pain Goal:                 Jaidence Geisler S

## 2017-05-14 LAB — OB RESULTS CONSOLE ANTIBODY SCREEN: Antibody Screen: NEGATIVE

## 2017-05-14 LAB — OB RESULTS CONSOLE ABO/RH: RH Type: POSITIVE

## 2017-05-14 LAB — OB RESULTS CONSOLE HEPATITIS B SURFACE ANTIGEN: Hepatitis B Surface Ag: NEGATIVE

## 2017-05-14 LAB — OB RESULTS CONSOLE RUBELLA ANTIBODY, IGM: Rubella: IMMUNE

## 2017-06-23 ENCOUNTER — Inpatient Hospital Stay (HOSPITAL_COMMUNITY)
Admission: AD | Admit: 2017-06-23 | Discharge: 2017-06-23 | Disposition: A | Discharge status: Home / Self Care | Payer: 59 | Source: Ambulatory Visit | Attending: Obstetrics and Gynecology | Admitting: Obstetrics and Gynecology

## 2017-06-23 ENCOUNTER — Encounter (HOSPITAL_COMMUNITY): Payer: Self-pay | Admitting: *Deleted

## 2017-06-23 DIAGNOSIS — O200 Threatened abortion: Secondary | ICD-10-CM

## 2017-06-23 DIAGNOSIS — R1030 Lower abdominal pain, unspecified: Secondary | ICD-10-CM | POA: Diagnosis present

## 2017-06-23 DIAGNOSIS — O219 Vomiting of pregnancy, unspecified: Secondary | ICD-10-CM

## 2017-06-23 DIAGNOSIS — K5900 Constipation, unspecified: Secondary | ICD-10-CM

## 2017-06-23 DIAGNOSIS — Z3A13 13 weeks gestation of pregnancy: Secondary | ICD-10-CM | POA: Diagnosis not present

## 2017-06-23 DIAGNOSIS — O26891 Other specified pregnancy related conditions, first trimester: Secondary | ICD-10-CM | POA: Diagnosis not present

## 2017-06-23 DIAGNOSIS — O99612 Diseases of the digestive system complicating pregnancy, second trimester: Secondary | ICD-10-CM

## 2017-06-23 LAB — URINALYSIS, ROUTINE W REFLEX MICROSCOPIC
BILIRUBIN URINE: NEGATIVE
Glucose, UA: NEGATIVE mg/dL
HGB URINE DIPSTICK: NEGATIVE
KETONES UR: NEGATIVE mg/dL
NITRITE: NEGATIVE
PH: 5 (ref 5.0–8.0)
Protein, ur: NEGATIVE mg/dL
Specific Gravity, Urine: 1.026 (ref 1.005–1.030)

## 2017-06-23 MED ORDER — ONDANSETRON HCL 4 MG PO TABS: 4.0000 mg | ORAL_TABLET | Freq: Every day | ORAL | 1 refills | Status: DC | PRN

## 2017-06-23 NOTE — MAU Note (Signed)
Chief Complaint: Abdominal Pain and Vaginal Bleeding   None    SUBJECTIVE HPI: Kristin Lyons is a 26 y.o. [id number] at 60102w3d who presents to Maternity Admissions reporting lower uterine cramping and some spotting yesterday.  Denies spotting today.  No intercourse.  Currently being treated for BV on day 3.  Taking Flagyl.  Complains of nausea not vomiting today. Taking Diclegis for nausea.  Doesn't always help so hard to drink.  Hard to eat and drink.  Complains of constipation.  No BM in two days.    Location: lower abd pain Quality: mild cramping Severity: 2/10 on pain scale Duration: a few days Modifying factors: Taking tylenol Associated signs and symptoms: constipation  Past Medical History:  Diagnosis Date  . Asthma   . Asthma   . Bacterial infection   . History of chicken pox   . Trichomonas   . Yeast infection    OB History  Gravida Para Term Preterm AB Living  6 2 1  0 3 1  SAB TAB Ectopic Multiple Live Births  2 0 1 0 1    # Outcome Date GA Lbr Len/2nd Weight Sex Delivery Anes PTL Lv  6 Current           5 Para 09/17/14 5189w4d 00:20 / 00:02 0.23 kg (8.1 oz) F Vag-Spont None  FD  4 Term 07/28/11 369w2d 14:56 / 00:53 2.555 kg (5 lb 10.1 oz) M Vag-Spont EPI  LIV  3 Ectopic           2 SAB           1 SAB              Past Surgical History:  Procedure Laterality Date  . DILATION AND CURETTAGE OF UTERUS    . DILATION AND CURETTAGE OF UTERUS N/A 09/17/2014   Procedure: DILATATION AND CURETTAGE;  Surgeon: Purcell NailsAngela Y Roberts, MD;  Location: [facility];  Service: Gynecology;  Laterality: N/A;  . DILATION AND EVACUATION N/A 09/17/2014   Procedure: DILATATION AND EVACUATION;  Surgeon: Purcell NailsAngela Y Roberts, MD;  Location: [facility];  Service: Gynecology;  Laterality: N/A;  . LAPAROSCOPY N/A 03/18/2017   Procedure: LAPAROSCOPY OPERATIVE WITH REMOVAL LEFT ECTOPIC PREGNANCY;  Surgeon: Silverio Layivard, Sandra, MD;  Location: [facility];  Service: Gynecology;  Laterality: N/A;  . ORIF ORBITAL FRACTURE Left  06/13/2016   Procedure: OPEN REDUCTION INTERNAL FIXATION (ORIF) ORBITAL FRACTURE;  Surgeon: Alena Billslaire S Dillingham, DO;  Location: [facility];  Service: Plastics;  Laterality: Left;   Social History   Socioeconomic History  . Marital status: Single    Spouse name: Not on file  . Number of children: Not on file  . Years of education: Not on file  . Highest education level: Not on file  Social Needs  . Financial resource strain: Not on file  . Food insecurity - worry: Not on file  . Food insecurity - inability: Not on file  . Transportation needs - medical: Not on file  . Transportation needs - non-medical: Not on file  Occupational History  . Not on file  Tobacco Use  . Smoking status: Never Smoker  . Smokeless tobacco: Never Used  Substance and Sexual Activity  . Alcohol use: No  . Drug use: No  . Sexual activity: Yes    Partners: Male    Birth control/protection: None  Other Topics Concern  . Not on file  Social History Narrative  . Not on file   Family History  Problem  Relation Age of Onset  . Hypertension Maternal Uncle   . Hypertension Paternal Uncle    No current facility-administered medications on file prior to encounter.    Current Outpatient Medications on File Prior to Encounter  Medication Sig Dispense Refill  . ibuprofen (ADVIL,MOTRIN) 600 MG tablet Take 1 tablet (600 mg total) by mouth every 6 (six) hours as needed. (Patient taking differently: Take 600 mg by mouth every 6 (six) hours as needed for mild pain, moderate pain or cramping. ) 30 tablet 1  . oxyCODONE (ROXICODONE) 5 MG immediate release tablet Take 1 tablet (5 mg total) by mouth every 6 (six) hours as needed. (Patient taking differently: Take 5 mg by mouth every 6 (six) hours as needed for moderate pain or severe pain. ) 14 tablet 0   Allergies  Allergen Reactions  . Shellfish Allergy Hives    I have reviewed patient's Past Medical Hx, Surgical Hx, Family Hx, Social Hx, medications and allergies.    Review of Systems  Constitutional: Negative.   HENT: Negative.   Eyes: Negative.   Respiratory: Negative.   Cardiovascular: Negative.   Gastrointestinal: Positive for abdominal distention and nausea.  Endocrine: Negative.   Genitourinary: Positive for vaginal discharge.  Musculoskeletal: Negative.   Allergic/Immunologic: Negative.   Neurological: Negative.   Hematological: Negative.   Psychiatric/Behavioral: Negative.     OBJECTIVE Patient Vitals for the past 24 hrs:  BP Temp Temp src Pulse Resp SpO2 Height Weight  06/23/17 1754 139/69 98.6 F (37 C) Oral 92 16 97 % 5\' 2"  (1.575 m) 93 kg (205 lb)   Constitutional: Well-developed, well-nourished female in no acute distress.  Cardiovascular: normal rate Respiratory: normal rate and effort.  GI: Abd soft, non-tender, gravid appropriate for gestational age. Pos BS x 4 MS: Extremities nontender, no edema, normal ROM Neurologic: Alert and oriented x 4.  GU: Neg CVAT.  SPECULUM EXAM: NEFG, thin white discharge, no blood noted, cervix clean  BIMANUAL: deferred FHTs 140 LAB RESULTS No results found for this or any previous visit (from the past 24 hour(s)).  IMAGING No results found.  MAU COURSE Orders Placed This Encounter  Procedures  . Urinalysis, Routine w reflex microscopic   No orders of the defined types were placed in this encounter.   MDM PE and UA ASSESSMENT Abdominal pain in pregnancy Nausea Constipation  PLAN Discharge home in stable condition.  Discussed increasing fluids and fiber.  Will send Zofran to pharmacy for nausea.  Discussed normal cramping and signs of miscarriage.  Pt left before getting discharge instructions.      Kenney Housemanrothero, Arnet Hofferber Jean, CNM 06/23/2017  6:40 PM

## 2017-06-23 NOTE — MAU Note (Signed)
Patient left room without receiving her discharge papers or further instructions by CNM.

## 2017-06-23 NOTE — MAU Note (Signed)
Pt reports lower abd pain and spotting today.

## 2017-06-23 NOTE — MAU Note (Signed)
Report given to Bernerd PhoNancy Prothero CNM, patient presents at 383w3d gestation, presents with midlline lower abdominal pain since yesterday and spotting today, denies any recent intercourse, fhr 140. Patient has history of 1 prior ectopic and 3 prior miscarriages so is very nervous.

## 2017-07-25 ENCOUNTER — Inpatient Hospital Stay (HOSPITAL_COMMUNITY)
Admission: AD | Admit: 2017-07-25 | Discharge: 2017-07-25 | Discharge status: Against Medical Advice | Payer: 59 | Source: Ambulatory Visit | Attending: Obstetrics and Gynecology | Admitting: Obstetrics and Gynecology

## 2017-07-25 DIAGNOSIS — Z3A19 19 weeks gestation of pregnancy: Secondary | ICD-10-CM | POA: Insufficient documentation

## 2017-07-25 DIAGNOSIS — X58XXXA Exposure to other specified factors, initial encounter: Secondary | ICD-10-CM | POA: Diagnosis not present

## 2017-07-25 DIAGNOSIS — O26892 Other specified pregnancy related conditions, second trimester: Secondary | ICD-10-CM | POA: Insufficient documentation

## 2017-07-25 DIAGNOSIS — S99911A Unspecified injury of right ankle, initial encounter: Secondary | ICD-10-CM | POA: Insufficient documentation

## 2017-07-25 NOTE — MAU Note (Signed)
Called Pt from lobby to come to room, Pt was no longer in the lobby.

## 2017-07-25 NOTE — MAU Note (Signed)
Pt reports she fell going up her steps and thinks she may have injured her right ankle.

## 2017-07-25 NOTE — MAU Note (Signed)
Pt not in lobby.  

## 2017-07-30 NOTE — L&D Delivery Note (Signed)
Delivery Note At 2024 a viable female was delivered via  (Presentation:vtx loa ;  ).  APGAR:8 ,9 ; weight  pending.   Placenta status: intact, velamentous- sent to path, .  Cord: 3 vessels  with the following complications: none   Anesthesia:  Epidural uncomfortable precipitous Episiotomy:   Lacerations:  bilateral labial; 1 degree vaginal Suture Repair: 3.0 vicryl Est. Blood Loss (mL):  100  Mom to postpartum.  Baby to Couplet care / Skin to Skin.  Lori A Clemmons CNM 12/09/2017, 8:49 PM

## 2017-08-19 ENCOUNTER — Inpatient Hospital Stay (HOSPITAL_COMMUNITY)
Admission: AD | Admit: 2017-08-19 | Discharge: 2017-08-19 | Disposition: A | Discharge status: Home / Self Care | Payer: 59 | Source: Ambulatory Visit | Attending: Obstetrics & Gynecology | Admitting: Obstetrics & Gynecology

## 2017-08-19 ENCOUNTER — Encounter (HOSPITAL_COMMUNITY): Payer: Self-pay

## 2017-08-19 DIAGNOSIS — Z3A21 21 weeks gestation of pregnancy: Secondary | ICD-10-CM | POA: Diagnosis not present

## 2017-08-19 DIAGNOSIS — O98812 Other maternal infectious and parasitic diseases complicating pregnancy, second trimester: Secondary | ICD-10-CM | POA: Insufficient documentation

## 2017-08-19 DIAGNOSIS — O99512 Diseases of the respiratory system complicating pregnancy, second trimester: Secondary | ICD-10-CM | POA: Diagnosis not present

## 2017-08-19 DIAGNOSIS — J45909 Unspecified asthma, uncomplicated: Secondary | ICD-10-CM | POA: Diagnosis not present

## 2017-08-19 DIAGNOSIS — B3731 Acute candidiasis of vulva and vagina: Secondary | ICD-10-CM

## 2017-08-19 DIAGNOSIS — Z79899 Other long term (current) drug therapy: Secondary | ICD-10-CM | POA: Diagnosis not present

## 2017-08-19 DIAGNOSIS — B373 Candidiasis of vulva and vagina: Secondary | ICD-10-CM

## 2017-08-19 DIAGNOSIS — B379 Candidiasis, unspecified: Secondary | ICD-10-CM | POA: Diagnosis not present

## 2017-08-19 DIAGNOSIS — R109 Unspecified abdominal pain: Secondary | ICD-10-CM | POA: Diagnosis present

## 2017-08-19 LAB — URINALYSIS, ROUTINE W REFLEX MICROSCOPIC
Glucose, UA: NEGATIVE mg/dL
HGB URINE DIPSTICK: NEGATIVE
KETONES UR: 5 mg/dL — AB
NITRITE: NEGATIVE
Protein, ur: 30 mg/dL — AB
SPECIFIC GRAVITY, URINE: 1.03 (ref 1.005–1.030)
pH: 5 (ref 5.0–8.0)

## 2017-08-19 MED ORDER — MICONAZOLE NITRATE 1200 & 2 MG & % VA KIT: 1.0000 | PACK | Freq: Once | VAGINAL | 0 refills | Status: AC

## 2017-08-19 MED ORDER — METRONIDAZOLE 0.75 % VA GEL: 1.0000 | Freq: Two times a day (BID) | VAGINAL | 0 refills | Status: DC

## 2017-08-19 NOTE — MAU Provider Note (Signed)
Chief Complaint:  Abdominal Pain   None    HPI: Kristin Lyons is a 27 y.o. [phone] at 2551w4d who presents to maternity admissions reporting right sided abdominal pain.  Denies bleeding or leaking.  Currently being treated for BV.  Flagyl making her stomach hurt..  Location: right side Quality: cramping Severity: 5/10 in pain scale Duration: two days  Modifying factors: Tylenol  Good fetal movement.   Pregnancy Course:   Past Medical History:  Diagnosis Date  . Asthma   . Asthma   . Bacterial infection   . History of chicken pox   . Trichomonas   . Yeast infection    OB History  Gravida Para Term Preterm AB Living  6 2 1  0 3 1  SAB TAB Ectopic Multiple Live Births  2 0 1 0 1    # Outcome Date GA Lbr Len/2nd Weight Sex Delivery Anes PTL Lv  6 Current           5 Para 09/17/14 6450w4d 00:20 / 00:02 0.23 kg (8.1 oz) F Vag-Spont None  FD  4 Term 07/28/11 2365w2d 14:56 / 00:53 2.555 kg (5 lb 10.1 oz) M Vag-Spont EPI  LIV  3 Ectopic           2 SAB           1 SAB              Past Surgical History:  Procedure Laterality Date  . DILATION AND CURETTAGE OF UTERUS    . DILATION AND CURETTAGE OF UTERUS N/A 09/17/2014   Procedure: DILATATION AND CURETTAGE;  Surgeon: Purcell NailsAngela Y Roberts, MD;  Location: [facility];  Service: Gynecology;  Laterality: N/A;  . DILATION AND EVACUATION N/A 09/17/2014   Procedure: DILATATION AND EVACUATION;  Surgeon: Purcell NailsAngela Y Roberts, MD;  Location: [facility];  Service: Gynecology;  Laterality: N/A;  . LAPAROSCOPY N/A 03/18/2017   Procedure: LAPAROSCOPY OPERATIVE WITH REMOVAL LEFT ECTOPIC PREGNANCY;  Surgeon: Silverio Layivard, Sandra, MD;  Location: [facility];  Service: Gynecology;  Laterality: N/A;  . ORIF ORBITAL FRACTURE Left 06/13/2016   Procedure: OPEN REDUCTION INTERNAL FIXATION (ORIF) ORBITAL FRACTURE;  Surgeon: Alena Billslaire S Dillingham, DO;  Location: [facility];  Service: Plastics;  Laterality: Left;   Family History  Problem Relation Age of Onset  . Hypertension Maternal  Uncle   . Hypertension Paternal Uncle    Social History   Tobacco Use  . Smoking status: Never Smoker  . Smokeless tobacco: Never Used  Substance Use Topics  . Alcohol use: No  . Drug use: No   Allergies  Allergen Reactions  . Shellfish Allergy Hives   Medications Prior to Admission  Medication Sig Dispense Refill Last Dose  . metroNIDAZOLE (FLAGYL) 250 MG tablet Take 250 mg by mouth 3 (three) times daily.   08/19/2017 at 1000  . ondansetron (ZOFRAN) 4 MG tablet Take 1 tablet (4 mg total) by mouth daily as needed for nausea or vomiting. 30 tablet 1     I have reviewed patient's Past Medical Hx, Surgical Hx, Family Hx, Social Hx, medications and allergies.   ROS:  Review of Systems  Constitutional: Negative.   HENT: Negative.   Eyes: Negative.   Respiratory: Negative.   Cardiovascular: Negative.   Gastrointestinal: Positive for abdominal pain and nausea.  Endocrine: Negative.   Genitourinary: Negative.   Musculoskeletal: Negative.   Allergic/Immunologic: Negative.   Neurological: Negative.   Hematological: Negative.   Psychiatric/Behavioral: Negative.     Physical Exam  Patient Vitals for the past 24 hrs:  BP Temp Temp src Pulse Resp SpO2 Height Weight  08/19/17 2218 138/69 98.2 F (36.8 C) Oral 95 17 99 % - -  08/19/17 2209 - - - - - - 5\' 5"  (1.651 m) 95.3 kg (210 lb)   Constitutional: Well-developed, well-nourished female in no acute distress.  Cardiovascular: normal rate Respiratory: normal effort GI: Abd soft, non-tender, gravid appropriate for gestational age. Pos BS x 4 MS: Extremities nontender, no edema, normal ROM Neurologic: Alert and oriented x 4.  GU: Neg CVAT.  Pelvic: NEFG, white thick discharge, no blood, cervix clean. No CMT     FHT:  150  Labs: Results for orders placed or performed during the hospital encounter of 08/19/17 (from the past 24 hour(s))  Urinalysis, Routine w reflex microscopic     Status: Abnormal   Collection Time:  08/19/17 10:10 PM  Result Value Ref Range   Color, Urine YELLOW YELLOW   APPearance HAZY (A) CLEAR   Specific Gravity, Urine 1.030 1.005 - 1.030   pH 5.0 5.0 - 8.0   Glucose, UA NEGATIVE NEGATIVE mg/dL   Hgb urine dipstick NEGATIVE NEGATIVE   Bilirubin Urine SMALL (A) NEGATIVE   Ketones, ur 5 (A) NEGATIVE mg/dL   Protein, ur 30 (A) NEGATIVE mg/dL   Nitrite NEGATIVE NEGATIVE   Leukocytes, UA MODERATE (A) NEGATIVE   RBC / HPF 0-5 0 - 5 RBC/hpf   WBC, UA 6-30 0 - 5 WBC/hpf   Bacteria, UA RARE (A) NONE SEEN   Squamous Epithelial / LPF 0-5 (A) NONE SEEN   Mucus PRESENT    Wet mount Positive clue, positive yeast Imaging:  No results found.  MAU Course: Orders Placed This Encounter  Procedures  . Urinalysis, Routine w reflex microscopic   No orders of the defined types were placed in this encounter.   MDM: PE and labs reviewed Assessment: Yeast infection in pregnancy Plan:   Will prescribe Metrogel  For BV.  Will stop Flagyl.  Will treat with vaginal infection monistat.  Increase fluid intake. Discharge home in stable condition.       Kenney Houseman, CNM 08/19/2017 10:56 PM

## 2017-08-19 NOTE — MAU Note (Signed)
Pain in the right side of her abdomen that started yesterday.  Occurs intermittently, has had the pain before but not in awhile. Has taken tylenol for the pain, helps sometimes.  No VB/LOF.  Vomited x1 today.  Reports feeling baby move today.

## 2017-08-19 NOTE — Discharge Instructions (Signed)
Vaginitis Vaginitis is an inflammation of the vagina. It can happen when the normal bacteria and yeast in the vagina grow too much. There are different types. Treatment will depend on the type you have. Follow these instructions at home:  Take all medicines as told by your doctor.  Keep your vagina area clean and dry. Avoid soap. Rinse the area with water.  Avoid washing and cleaning out the vagina (douching).  Do not use tampons or have sex (intercourse) until your treatment is done.  Wipe from front to back after going to the restroom.  Wear cotton underwear.  Avoid wearing underwear while you sleep until your vaginitis is gone.  Avoid tight pants. Avoid underwear or nylons without a cotton panel.  Take off wet clothing (such as a bathing suit) as soon as you can.  Use mild, unscented products. Avoid fabric softeners and scented: ? Feminine sprays. ? Laundry detergents. ? Tampons. ? Soaps or bubble baths.  Practice safe sex and use condoms. Get help right away if:  You have belly (abdominal) pain.  You have a fever or lasting symptoms for more than 2-3 days.  You have a fever and your symptoms suddenly get worse. This information is not intended to replace advice given to you by your health care provider. Make sure you discuss any questions you have with your health care provider. Document Released: 10/12/2008 Document Revised: 12/22/2015 Document Reviewed: 12/27/2011 Elsevier Interactive Patient Education  2017 Elsevier Inc.   Vaginal Yeast infection, Adult Vaginal yeast infection is a condition that causes soreness, swelling, and redness (inflammation) of the vagina. It also causes vaginal discharge. This is a common condition. Some women get this infection frequently. What are the causes? This condition is caused by a change in the normal balance of the yeast (candida) and bacteria that live in the vagina. This change causes an overgrowth of yeast, which causes the  inflammation. What increases the risk? This condition is more likely to develop in:  Women who take antibiotic medicines.  Women who have diabetes.  Women who take birth control pills.  Women who are pregnant.  Women who douche often.  Women who have a weak defense (immune) system.  Women who have been taking steroid medicines for a long time.  Women who frequently wear tight clothing.  What are the signs or symptoms? Symptoms of this condition include:  White, thick vaginal discharge.  Swelling, itching, redness, and irritation of the vagina. The lips of the vagina (vulva) may be affected as well.  Pain or a burning feeling while urinating.  Pain during sex.  How is this diagnosed? This condition is diagnosed with a medical history and physical exam. This will include a pelvic exam. Your health care provider will examine a sample of your vaginal discharge under a microscope. Your health care provider may send this sample for testing to confirm the diagnosis. How is this treated? This condition is treated with medicine. Medicines may be over-the-counter or prescription. You may be told to use one or more of the following:  Medicine that is taken orally.  Medicine that is applied as a cream.  Medicine that is inserted directly into the vagina (suppository).  Follow these instructions at home:  Take or apply over-the-counter and prescription medicines only as told by your health care provider.  Do not have sex until your health care provider has approved. Tell your sex partner that you have a yeast infection. That person should go to his or her  care provider if he or she develops symptoms.  Do not wear tight clothes, such as pantyhose or tight pants.  Avoid using tampons until your health care provider approves.  Eat more yogurt. This may help to keep your yeast infection from returning.  Try taking a sitz bath to help with discomfort. This is a warm water  bath that is taken while you are sitting down. The water should only come up to your hips and should cover your buttocks. Do this 3-4 times per day or as told by your health care provider.  Do not douche.  Wear breathable, cotton underwear.  If you have diabetes, keep your blood sugar levels under control. Contact a health care provider if:  You have a fever.  Your symptoms go away and then return.  Your symptoms do not get better with treatment.  Your symptoms get worse.  You have new symptoms.  You develop blisters in or around your vagina.  You have blood coming from your vagina and it is not your menstrual period.  You develop pain in your abdomen. This information is not intended to replace advice given to you by your health care provider. Make sure you discuss any questions you have with your health care provider. Document Released: 04/25/2005 Document Revised: 12/28/2015 Document Reviewed: 01/17/2015 Elsevier Interactive Patient Education  2018 Elsevier Inc.  

## 2017-09-21 ENCOUNTER — Other Ambulatory Visit: Payer: Self-pay

## 2017-09-21 ENCOUNTER — Encounter (HOSPITAL_COMMUNITY): Payer: Self-pay

## 2017-09-21 ENCOUNTER — Inpatient Hospital Stay (HOSPITAL_COMMUNITY)
Admission: AD | Admit: 2017-09-21 | Discharge: 2017-09-21 | Disposition: A | Discharge status: Home / Self Care | Payer: Medicaid Other | Source: Ambulatory Visit | Attending: Obstetrics and Gynecology | Admitting: Obstetrics and Gynecology

## 2017-09-21 DIAGNOSIS — O26892 Other specified pregnancy related conditions, second trimester: Secondary | ICD-10-CM

## 2017-09-21 DIAGNOSIS — R109 Unspecified abdominal pain: Secondary | ICD-10-CM | POA: Diagnosis not present

## 2017-09-21 DIAGNOSIS — Z3A26 26 weeks gestation of pregnancy: Secondary | ICD-10-CM | POA: Insufficient documentation

## 2017-09-21 DIAGNOSIS — O1202 Gestational edema, second trimester: Secondary | ICD-10-CM | POA: Insufficient documentation

## 2017-09-21 DIAGNOSIS — R1030 Lower abdominal pain, unspecified: Secondary | ICD-10-CM | POA: Insufficient documentation

## 2017-09-21 LAB — URINALYSIS, ROUTINE W REFLEX MICROSCOPIC
BILIRUBIN URINE: NEGATIVE
Glucose, UA: NEGATIVE mg/dL
Hgb urine dipstick: NEGATIVE
Ketones, ur: 20 mg/dL — AB
Nitrite: NEGATIVE
Protein, ur: NEGATIVE mg/dL
Specific Gravity, Urine: 1.021 (ref 1.005–1.030)
pH: 7 (ref 5.0–8.0)

## 2017-09-21 NOTE — MAU Note (Signed)
Pt presents to MAU with c/o pelvic pain that started yesterday and increased swelling in feet. Pt denies VB and LOF. +FM

## 2017-09-21 NOTE — Discharge Instructions (Signed)

## 2017-09-21 NOTE — MAU Provider Note (Signed)
History   [id number] @ 26.2 wks in with c/o low abd pain and swelling since yesterday. Denies LOF or vag bleeding.  CSN: [id number]  Arrival date & time 09/21/17  0940   None     Chief Complaint  Patient presents with  . Pelvic Pain    HPI  Past Medical History:  Diagnosis Date  . Asthma   . Asthma   . Bacterial infection   . History of chicken pox   . Trichomonas   . Yeast infection     Past Surgical History:  Procedure Laterality Date  . DILATION AND CURETTAGE OF UTERUS    . DILATION AND CURETTAGE OF UTERUS N/A 09/17/2014   Procedure: DILATATION AND CURETTAGE;  Surgeon: Purcell NailsAngela Y Roberts, MD;  Location: [facility];  Service: Gynecology;  Laterality: N/A;  . DILATION AND EVACUATION N/A 09/17/2014   Procedure: DILATATION AND EVACUATION;  Surgeon: Purcell NailsAngela Y Roberts, MD;  Location: [facility];  Service: Gynecology;  Laterality: N/A;  . LAPAROSCOPY N/A 03/18/2017   Procedure: LAPAROSCOPY OPERATIVE WITH REMOVAL LEFT ECTOPIC PREGNANCY;  Surgeon: Silverio Layivard, Sandra, MD;  Location: [facility];  Service: Gynecology;  Laterality: N/A;  . ORIF ORBITAL FRACTURE Left 06/13/2016   Procedure: OPEN REDUCTION INTERNAL FIXATION (ORIF) ORBITAL FRACTURE;  Surgeon: Alena Billslaire S Dillingham, DO;  Location: [facility];  Service: Plastics;  Laterality: Left;    Family History  Problem Relation Age of Onset  . Hypertension Maternal Uncle   . Hypertension Paternal Uncle     Social History   Tobacco Use  . Smoking status: Never Smoker  . Smokeless tobacco: Never Used  Substance Use Topics  . Alcohol use: No  . Drug use: No    OB History    Gravida Para Term Preterm AB Living   6 2 1  0 3 1   SAB TAB Ectopic Multiple Live Births   2 0 1 0 1      Review of Systems  Constitutional: Negative.   HENT: Negative.   Eyes: Negative.   Respiratory: Negative.   Cardiovascular: Negative.   Gastrointestinal: Positive for abdominal pain.  Endocrine: Negative.   Genitourinary: Negative.   Musculoskeletal: Negative.    Skin: Negative.   Neurological: Negative.   Hematological: Negative.   Psychiatric/Behavioral: Negative.     Allergies  Shellfish allergy  Home Medications    BP 131/70 (BP Location: Right Arm)   Pulse 98   Temp 98.4 F (36.9 C) (Oral)   Resp 18   Ht 5\' 5"  (1.651 m)   Wt 211 lb (95.7 kg)   LMP 01/16/2017   BMI 35.11 kg/m   Physical Exam  Constitutional: She is oriented to person, place, and time. She appears well-developed and well-nourished.  HENT:  Head: Normocephalic.  Neck: Normal range of motion.  Cardiovascular: Normal rate, regular rhythm, normal heart sounds and intact distal pulses.  Pulmonary/Chest: Effort normal and breath sounds normal.  Abdominal: Soft. Bowel sounds are normal.  Genitourinary: Vagina normal and uterus normal.  Musculoskeletal: Normal range of motion.  Neurological: She is alert and oriented to person, place, and time. She has normal reflexes.  Skin: Skin is warm and dry.  Psychiatric: She has a normal mood and affect. Her behavior is normal. Judgment and thought content normal.    MAU Course  Procedures (including critical care time)  Labs Reviewed  URINALYSIS, ROUTINE W REFLEX MICROSCOPIC   No results found.   1. Abdominal pain in pregnancy, second trimester   2. Edema in pregnancy  in second trimester       MDM  FHR pattern reassurring no decels. No uc's. VSS, SVE cl/firm/post/high. POC discussed with Dr. Normand Sloop. Pt to be d/c home.

## 2017-09-27 ENCOUNTER — Inpatient Hospital Stay (HOSPITAL_COMMUNITY)
Admission: AD | Admit: 2017-09-27 | Discharge: 2017-09-27 | Disposition: A | Discharge status: Home / Self Care | Payer: Medicaid Other | Source: Ambulatory Visit | Attending: Obstetrics & Gynecology | Admitting: Obstetrics & Gynecology

## 2017-09-27 ENCOUNTER — Encounter (HOSPITAL_COMMUNITY): Payer: Self-pay | Admitting: *Deleted

## 2017-09-27 DIAGNOSIS — O4692 Antepartum hemorrhage, unspecified, second trimester: Secondary | ICD-10-CM | POA: Insufficient documentation

## 2017-09-27 DIAGNOSIS — Z3A27 27 weeks gestation of pregnancy: Secondary | ICD-10-CM | POA: Diagnosis not present

## 2017-09-27 DIAGNOSIS — N898 Other specified noninflammatory disorders of vagina: Secondary | ICD-10-CM | POA: Diagnosis not present

## 2017-09-27 DIAGNOSIS — B373 Candidiasis of vulva and vagina: Secondary | ICD-10-CM

## 2017-09-27 DIAGNOSIS — O98812 Other maternal infectious and parasitic diseases complicating pregnancy, second trimester: Secondary | ICD-10-CM | POA: Diagnosis not present

## 2017-09-27 DIAGNOSIS — O26892 Other specified pregnancy related conditions, second trimester: Secondary | ICD-10-CM | POA: Diagnosis not present

## 2017-09-27 DIAGNOSIS — R102 Pelvic and perineal pain: Secondary | ICD-10-CM

## 2017-09-27 DIAGNOSIS — B3731 Acute candidiasis of vulva and vagina: Secondary | ICD-10-CM

## 2017-09-27 DIAGNOSIS — N930 Postcoital and contact bleeding: Secondary | ICD-10-CM | POA: Diagnosis not present

## 2017-09-27 LAB — URINALYSIS, ROUTINE W REFLEX MICROSCOPIC
BILIRUBIN URINE: NEGATIVE
GLUCOSE, UA: NEGATIVE mg/dL
HGB URINE DIPSTICK: NEGATIVE
KETONES UR: NEGATIVE mg/dL
NITRITE: NEGATIVE
PH: 7 (ref 5.0–8.0)
Protein, ur: NEGATIVE mg/dL
Specific Gravity, Urine: 1.023 (ref 1.005–1.030)

## 2017-09-27 LAB — WET PREP, GENITAL
Clue Cells Wet Prep HPF POC: NONE SEEN
SPERM: NONE SEEN
Trich, Wet Prep: NONE SEEN

## 2017-09-27 LAB — POCT FERN TEST: POCT Fern Test: NEGATIVE

## 2017-09-27 MED ORDER — MICONAZOLE NITRATE 2 % VA CREA: 1.0000 | TOPICAL_CREAM | Freq: Every day | VAGINAL | 1 refills | Status: AC

## 2017-09-27 NOTE — MAU Note (Signed)
Patient presents with lower abdominal pain which has been there, onset of small amount of vaginal bleeding this morning, after intercourse last night.

## 2017-09-27 NOTE — MAU Note (Signed)
Urine in lab 

## 2017-09-27 NOTE — Discharge Instructions (Signed)
Vaginal Bleeding During Pregnancy, Second Trimester A small amount of bleeding (spotting) from the vagina is common in pregnancy. Sometimes the bleeding is normal and is not a problem, and sometimes it is a sign of something serious. Be sure to tell your doctor about any bleeding from your vagina right away. Follow these instructions at home:  Watch your condition for any changes.  Follow your doctor's instructions about how active you can be.  If you are on bed rest: ? You may need to stay in bed and only get up to use the bathroom. ? You may be allowed to do some activities. ? If you need help, make plans for someone to help you.  Write down: ? The number of pads you use each day. ? How often you change pads. ? How soaked (saturated) your pads are.  Do not use tampons.  Do not douche.  Do not have sex or orgasms until your doctor says it is okay.  If you pass any tissue from your vagina, save the tissue so you can show it to your doctor.  Only take medicines as told by your doctor.  Do not take aspirin because it can make you bleed.  Do not exercise, lift heavy weights, or do any activities that take a lot of energy and effort unless your doctor says it is okay.  Keep all follow-up visits as told by your doctor. Contact a doctor if:  You bleed from your vagina.  You have cramps.  You have labor pains.  You have a fever that does not go away after you take medicine. Get help right away if:  You have very bad cramps in your back or belly (abdomen).  You have contractions.  You have chills.  You pass large clots or tissue from your vagina.  You bleed more.  You feel light-headed or weak.  You pass out (faint).  You are leaking fluid or have a gush of fluid from your vagina. This information is not intended to replace advice given to you by your health care provider. Make sure you discuss any questions you have with your health care provider. Document  Released: 11/30/2013 Document Revised: 12/22/2015 Document Reviewed: 03/23/2013 Elsevier Interactive Patient Education  2018 Elsevier Inc.  

## 2017-09-27 NOTE — MAU Provider Note (Signed)
Chief Complaint:  Abdominal Pain and Vaginal Bleeding   First Provider Initiated Contact with Patient 09/27/17 1152      HPI: Kristin Lyons is a 27 y.o. [id number] at 2476w1d who presents to MAU reporting pelvic pain, vaginal bleeding and vaginal discharge.  Patient states that she has chronic pelvic pain.  States that she has been told several times that this is a chronic thing will only get better when she is done being pregnant.  States that she was support band to help with pain.  Patient states that she is having some associated cramping that feels new.  She is endorsing vaginal discharge that she describes as chunks of white to yellow discharge.  Also endorsing some vaginal bleeding that started this morning after intercourse.  States that it is light spotting and only visible when she wipes.  States that she has never had bleeding before or after intercourse.  Denies contractions, leakage of fluid. Good fetal movement.   Pregnancy Course:    Past Medical History: Past Medical History:  Diagnosis Date  . Asthma   . Asthma   . Bacterial infection   . History of chicken pox   . Trichomonas   . Yeast infection     Past obstetric history: OB History  Gravida Para Term Preterm AB Living  6 2 1  0 3 1  SAB TAB Ectopic Multiple Live Births  2 0 1 0 1    # Outcome Date GA Lbr Len/2nd Weight Sex Delivery Anes PTL Lv  6 Current           5 Para 09/17/14 146w4d 00:20 / 00:02 0.23 kg (8.1 oz) F Vag-Spont None  FD  4 Term 07/28/11 3821w2d 14:56 / 00:53 2.555 kg (5 lb 10.1 oz) M Vag-Spont EPI  LIV  3 Ectopic           2 SAB           1 SAB               Past Surgical History: Past Surgical History:  Procedure Laterality Date  . DILATION AND CURETTAGE OF UTERUS    . DILATION AND CURETTAGE OF UTERUS N/A 09/17/2014   Procedure: DILATATION AND CURETTAGE;  Surgeon: Purcell NailsAngela Y Roberts, MD;  Location: [facility];  Service: Gynecology;  Laterality: N/A;  . DILATION AND EVACUATION N/A 09/17/2014   Procedure: DILATATION AND EVACUATION;  Surgeon: Purcell NailsAngela Y Roberts, MD;  Location: [facility];  Service: Gynecology;  Laterality: N/A;  . LAPAROSCOPY N/A 03/18/2017   Procedure: LAPAROSCOPY OPERATIVE WITH REMOVAL LEFT ECTOPIC PREGNANCY;  Surgeon: Silverio Layivard, Sandra, MD;  Location: [facility];  Service: Gynecology;  Laterality: N/A;  . ORIF ORBITAL FRACTURE Left 06/13/2016   Procedure: OPEN REDUCTION INTERNAL FIXATION (ORIF) ORBITAL FRACTURE;  Surgeon: Alena Billslaire S Dillingham, DO;  Location: [facility];  Service: Plastics;  Laterality: Left;     Family History: Family History  Problem Relation Age of Onset  . Hypertension Maternal Uncle   . Hypertension Paternal Uncle     Social History: Social History   Tobacco Use  . Smoking status: Never Smoker  . Smokeless tobacco: Never Used  Substance Use Topics  . Alcohol use: No  . Drug use: No    Allergies:  Allergies  Allergen Reactions  . Shellfish Allergy Hives    Meds:  Medications Prior to Admission  Medication Sig Dispense Refill Last Dose  . ondansetron (ZOFRAN) 4 MG tablet Take 1 tablet (4 mg total) by mouth  daily as needed for nausea or vomiting. (Patient not taking: Reported on 09/21/2017) 30 tablet 1 Not Taking at Unknown time  . Prenatal Vit-Fe Fumarate-FA (PRENATAL MULTIVITAMIN) TABS tablet Take 1 tablet by mouth daily at 12 noon.   09/20/2017 at Unknown time    I have reviewed patient's Past Medical Hx, Surgical Hx, Family Hx, Social Hx, medications and allergies.   ROS:  All systems reviewed and are negative for acute change except as noted in the HPI.   Physical Exam   Patient Vitals for the past 24 hrs:  BP Temp Pulse Resp Height Weight  09/27/17 1145 135/70 98.6 F (37 C) (!) 103 18 - -  09/27/17 1135 - - - - 5\' 5"  (1.651 m) 95.7 kg (211 lb)   Constitutional: Well-developed, well-nourished female in no acute distress.  HEENT: NCAT, EOMI, MMM Cardiovascular: normal rate and rhythm, pulses intact Respiratory: normal rate and  effort.  GI: Abd soft, non-tender, gravid appropriate for gestational age.  MS: Extremities nontender, no edema, normal ROM Neurologic: Alert and oriented x 4.  GU: Neg CVAT. Pelvic: NEFG but labia with erythema and tenderness to palpation, thick chunky white discharge coating vagina and cervix, minimal blood, cervix friable and minimally dilated. No CMT Psych: normal mood and affect  Dilation: 1.5 Effacement (%): 50 Station: Ballotable Exam by:: Dr. Doroteo Glassman  Labs: Results for orders placed or performed during the hospital encounter of 09/27/17 (from the past 24 hour(s))  Urinalysis, Routine w reflex microscopic     Status: Abnormal   Collection Time: 09/27/17 11:32 AM  Result Value Ref Range   Color, Urine YELLOW YELLOW   APPearance CLEAR CLEAR   Specific Gravity, Urine 1.023 1.005 - 1.030   pH 7.0 5.0 - 8.0   Glucose, UA NEGATIVE NEGATIVE mg/dL   Hgb urine dipstick NEGATIVE NEGATIVE   Bilirubin Urine NEGATIVE NEGATIVE   Ketones, ur NEGATIVE NEGATIVE mg/dL   Protein, ur NEGATIVE NEGATIVE mg/dL   Nitrite NEGATIVE NEGATIVE   Leukocytes, UA MODERATE (A) NEGATIVE   RBC / HPF 0-5 0 - 5 RBC/hpf   WBC, UA 6-30 0 - 5 WBC/hpf   Bacteria, UA RARE (A) NONE SEEN   Squamous Epithelial / LPF 0-5 (A) NONE SEEN   Mucus PRESENT   Wet prep, genital     Status: Abnormal   Collection Time: 09/27/17 12:05 PM  Result Value Ref Range   Yeast Wet Prep HPF POC PRESENT (A) NONE SEEN   Trich, Wet Prep NONE SEEN NONE SEEN   Clue Cells Wet Prep HPF POC NONE SEEN NONE SEEN   WBC, Wet Prep HPF POC MANY (A) NONE SEEN   Sperm NONE SEEN   Fern Test     Status: Normal   Collection Time: 09/27/17 12:18 PM  Result Value Ref Range   POCT Fern Test Negative = intact amniotic membranes     Imaging:  No results found.  MAU Course: Vitals and nursing notes reviewed I have ordered labs and reviewed them Wet prep and pelvic exam with yeast infection that involves vagina and surrounding labia Watery  discharge concerning for ROM but fern negative. Unable to do amnisure due to recent intercourse.  UA with leukocytes will send urine for culture.  Treatments given in MAU: None  I personally reviewed the patient's NST today, found to be REACTIVE. 145 bpm, mod var, +accels, no decels. CTX: UI.  Discussed case with Dr. Mora Appl whom is in agreement with plan  MDM: Plan of care reviewed  with patient, including labs and tests ordered and medical treatment.   Assessment: 1. Vaginal discharge   2. Yeast infection involving the vagina and surrounding area   3. Pelvic pain affecting pregnancy in second trimester, antepartum   4. Postcoital bleeding     Plan: Discharge home in stable condition.  Rx for vaginal azole for 10 days Preterm labor precautions and fetal kick counts reviewed Handout given Follow-up with OB provider   Caryl Ada, DO [facility], [facility] 09/27/2017 1:15 PM

## 2017-09-28 LAB — CULTURE, OB URINE

## 2017-09-30 LAB — GC/CHLAMYDIA PROBE AMP (~~LOC~~) NOT AT ARMC
Chlamydia: NEGATIVE
NEISSERIA GONORRHEA: NEGATIVE

## 2017-10-04 LAB — OB RESULTS CONSOLE HIV ANTIBODY (ROUTINE TESTING): HIV: NONREACTIVE

## 2017-10-04 LAB — OB RESULTS CONSOLE RPR: RPR: NONREACTIVE

## 2017-10-31 LAB — OB RESULTS CONSOLE GC/CHLAMYDIA: Gonorrhea: NEGATIVE

## 2017-11-09 ENCOUNTER — Inpatient Hospital Stay (HOSPITAL_COMMUNITY)
Admission: AD | Admit: 2017-11-09 | Discharge: 2017-11-10 | Disposition: A | Discharge status: Home / Self Care | Payer: BLUE CROSS/BLUE SHIELD | Source: Ambulatory Visit | Attending: Obstetrics & Gynecology | Admitting: Obstetrics & Gynecology

## 2017-11-09 ENCOUNTER — Encounter (HOSPITAL_COMMUNITY): Payer: Self-pay | Admitting: *Deleted

## 2017-11-09 DIAGNOSIS — Z3A33 33 weeks gestation of pregnancy: Secondary | ICD-10-CM | POA: Insufficient documentation

## 2017-11-09 DIAGNOSIS — O368190 Decreased fetal movements, unspecified trimester, not applicable or unspecified: Secondary | ICD-10-CM

## 2017-11-09 DIAGNOSIS — O368130 Decreased fetal movements, third trimester, not applicable or unspecified: Secondary | ICD-10-CM | POA: Insufficient documentation

## 2017-11-09 NOTE — MAU Note (Signed)
Decreased FM since 1500. Has had some ctxs today. Some cramping now. Denies LOF or vag bleeding

## 2017-11-10 ENCOUNTER — Inpatient Hospital Stay (HOSPITAL_COMMUNITY): Payer: BLUE CROSS/BLUE SHIELD

## 2017-11-10 DIAGNOSIS — O368130 Decreased fetal movements, third trimester, not applicable or unspecified: Secondary | ICD-10-CM | POA: Diagnosis not present

## 2017-11-10 LAB — URINALYSIS, ROUTINE W REFLEX MICROSCOPIC
Bilirubin Urine: NEGATIVE
GLUCOSE, UA: NEGATIVE mg/dL
HGB URINE DIPSTICK: NEGATIVE
Ketones, ur: NEGATIVE mg/dL
NITRITE: NEGATIVE
PH: 6 (ref 5.0–8.0)
Protein, ur: NEGATIVE mg/dL
Specific Gravity, Urine: 1.011 (ref 1.005–1.030)

## 2017-11-10 MED ORDER — NIFEDIPINE 10 MG PO CAPS
10.0000 mg | ORAL_CAPSULE | ORAL | Status: DC | PRN
  Administered 2017-11-10: 10 mg via ORAL
  Filled 2017-11-10: qty 1

## 2017-11-10 MED ORDER — LACTATED RINGERS IV SOLN
INTRAVENOUS | Status: DC
  Administered 2017-11-10: 02:00:00 via INTRAVENOUS

## 2017-11-10 MED ORDER — NIFEDIPINE 10 MG PO CAPS
10.0000 mg | ORAL_CAPSULE | Freq: Once | ORAL | Status: AC
  Administered 2017-11-10: 10 mg via ORAL
  Filled 2017-11-10: qty 1

## 2017-11-10 MED ORDER — LACTATED RINGERS IV BOLUS
1000.0000 mL | Freq: Once | INTRAVENOUS | Status: AC
  Administered 2017-11-10: 1000 mL via INTRAVENOUS

## 2017-11-10 NOTE — Discharge Instructions (Signed)

## 2017-11-16 ENCOUNTER — Other Ambulatory Visit: Payer: Self-pay

## 2017-11-16 ENCOUNTER — Inpatient Hospital Stay (HOSPITAL_COMMUNITY)
Admission: AD | Admit: 2017-11-16 | Discharge: 2017-11-16 | Disposition: A | Discharge status: Home / Self Care | Payer: BLUE CROSS/BLUE SHIELD | Source: Ambulatory Visit | Attending: Obstetrics & Gynecology | Admitting: Obstetrics & Gynecology

## 2017-11-16 ENCOUNTER — Encounter (HOSPITAL_COMMUNITY): Payer: Self-pay

## 2017-11-16 DIAGNOSIS — Z3A34 34 weeks gestation of pregnancy: Secondary | ICD-10-CM

## 2017-11-16 DIAGNOSIS — O4703 False labor before 37 completed weeks of gestation, third trimester: Secondary | ICD-10-CM

## 2017-11-16 LAB — URINALYSIS, ROUTINE W REFLEX MICROSCOPIC
BACTERIA UA: NONE SEEN
BILIRUBIN URINE: NEGATIVE
Glucose, UA: NEGATIVE mg/dL
Hgb urine dipstick: NEGATIVE
KETONES UR: NEGATIVE mg/dL
Nitrite: NEGATIVE
PROTEIN: NEGATIVE mg/dL
Specific Gravity, Urine: 1.014 (ref 1.005–1.030)
pH: 7 (ref 5.0–8.0)

## 2017-11-16 MED ORDER — LACTATED RINGERS IV BOLUS
1000.0000 mL | Freq: Once | INTRAVENOUS | Status: AC
  Administered 2017-11-16: 1000 mL via INTRAVENOUS

## 2017-11-16 MED ORDER — NIFEDIPINE 10 MG PO CAPS
10.0000 mg | ORAL_CAPSULE | ORAL | Status: AC | PRN
  Administered 2017-11-16 (×3): 10 mg via ORAL
  Filled 2017-11-16 (×3): qty 1

## 2017-11-16 MED ORDER — BETAMETHASONE SOD PHOS & ACET 6 (3-3) MG/ML IJ SUSP
12.0000 mg | Freq: Once | INTRAMUSCULAR | Status: AC
  Administered 2017-11-16: 12 mg via INTRAMUSCULAR
  Filled 2017-11-16: qty 2

## 2017-11-16 MED ORDER — NIFEDIPINE 20 MG PO CAPS: 20.0000 mg | ORAL_CAPSULE | Freq: Three times a day (TID) | ORAL | 0 refills | Status: DC | PRN

## 2017-11-16 NOTE — MAU Note (Signed)
Pt presents to MAU with c/o ctx's that started yesterday afternoon. Pt denies VB and LOF. +FM

## 2017-11-16 NOTE — Discharge Instructions (Signed)
Preterm Labor and Birth Information Pregnancy normally lasts 39-41 weeks. Preterm labor is when labor starts early. It starts before you have been pregnant for 37 whole weeks. What are the risk factors for preterm labor? Preterm labor is more likely to occur in women who:  Have an infection while pregnant.  Have a cervix that is short.  Have gone into preterm labor before.  Have had surgery on their cervix.  Are younger than age 27.  Are older than age 27.  Are African American.  Are pregnant with two or more babies.  Take street drugs while pregnant.  Smoke while pregnant.  Do not gain enough weight while pregnant.  Got pregnant right after another pregnancy.  What are the symptoms of preterm labor? Symptoms of preterm labor include:  Cramps. The cramps may feel like the cramps some women get during their period. The cramps may happen with watery poop (diarrhea).  Pain in the belly (abdomen).  Pain in the lower back.  Regular contractions or tightening. It may feel like your belly is getting tighter.  Pressure in the lower belly that seems to get stronger.  More fluid (discharge) leaking from the vagina. The fluid may be watery or bloody.  Water breaking.  Why is it important to notice signs of preterm labor? Babies who are born early may not be fully developed. They have a higher chance for:  Long-term heart problems.  Long-term lung problems.  Trouble controlling body systems, like breathing.  Bleeding in the brain.  A condition called cerebral palsy.  Learning difficulties.  Death.  These risks are highest for babies who are born before 34 weeks of pregnancy. How is preterm labor treated? Treatment depends on:  How long you were pregnant.  Your condition.  The health of your baby.  Treatment may involve:  Having a stitch (suture) placed in your cervix. When you give birth, your cervix opens so the baby can come out. The stitch keeps the  cervix from opening too soon.  Staying at the hospital.  Taking or getting medicines, such as: ? Hormone medicines. ? Medicines to stop contractions. ? Medicines to help the babys lungs develop. ? Medicines to prevent your baby from having cerebral palsy.  What should I do if I am in preterm labor? If you think you are going into labor too soon, call your doctor right away. How can I prevent preterm labor?  Do not use any tobacco products. ? Examples of these are cigarettes, chewing tobacco, and e-cigarettes. ? If you need help quitting, ask your doctor.  Do not use street drugs.  Do not use any medicines unless you ask your doctor if they are safe for you.  Talk with your doctor before taking any herbal supplements.  Make sure you gain enough weight.  Watch for infection. If you think you might have an infection, get it checked right away.  If you have gone into preterm labor before, tell your doctor. This information is not intended to replace advice given to you by your health care provider. Make sure you discuss any questions you have with your health care provider. Document Released: 10/12/2008 Document Revised: 12/27/2015 Document Reviewed: 12/07/2015 Elsevier Interactive Patient Education  2018 Elsevier Inc.   Pelvic Rest Pelvic rest may be recommended if:  Your placenta is partially or completely covering the opening of your cervix (placenta previa).  There is bleeding between the wall of the uterus and the amniotic sac in the first trimester of  pregnancy (subchorionic hemorrhage).  You went into labor too early (preterm labor).  Based on your overall health and the health of your baby, your health care provider will decide if pelvic rest is right for you. How do I rest my pelvis? For as long as told by your health care provider:  Do not have sex, sexual stimulation, or an orgasm.  Do not use tampons. Do not douche. Do not put anything in your vagina.  Do  not lift anything that is heavier than 10 lb (4.5 kg).  Avoid activities that take a lot of effort (are strenuous).  Avoid any activity in which your pelvic muscles could become strained.  When should I seek medical care? Seek medical care if you have:  Cramping pain in your lower abdomen.  Vaginal discharge.  A low, dull backache.  Regular contractions.  Uterine tightening.  When should I seek immediate medical care? Seek immediate medical care if:  You have vaginal bleeding and you are pregnant.  This information is not intended to replace advice given to you by your health care provider. Make sure you discuss any questions you have with your health care provider. Document Released: 11/10/2010 Document Revised: 12/22/2015 Document Reviewed: 01/17/2015 Elsevier Interactive Patient Education  [employer].

## 2017-11-16 NOTE — MAU Provider Note (Signed)
History     CSN: [id number]  Arrival date and time: 11/16/17 8295   First Provider Initiated Contact with Patient 11/16/17 [phone]      Chief Complaint  Patient presents with  . Contractions   HPI [name] is a 27 y.o. [phone] at [redacted]w[redacted]d who presents to MAU today with complaint of contractions q 4-6 minutes since last night. No VB, LOF today. NO history of PTD or complications with this pregnancy. +FM. Patient later states that she was here for a similar complaint last week and received a couple doses of Procardia and IV fluids.   OB History    Gravida  6   Para  2   Term  1   Preterm  0   AB  3   Living  1     SAB  2   TAB  0   Ectopic  1   Multiple  0   Live Births  1           Past Medical History:  Diagnosis Date  . Asthma   . Asthma   . Bacterial infection   . History of chicken pox   . Trichomonas   . Yeast infection     Past Surgical History:  Procedure Laterality Date  . DILATION AND CURETTAGE OF UTERUS    . DILATION AND CURETTAGE OF UTERUS N/A 09/17/2014   Procedure: DILATATION AND CURETTAGE;  Surgeon: Purcell Nails, MD;  Location: [facility];  Service: Gynecology;  Laterality: N/A;  . DILATION AND EVACUATION N/A 09/17/2014   Procedure: DILATATION AND EVACUATION;  Surgeon: Purcell Nails, MD;  Location: [facility];  Service: Gynecology;  Laterality: N/A;  . LAPAROSCOPY N/A 03/18/2017   Procedure: LAPAROSCOPY OPERATIVE WITH REMOVAL LEFT ECTOPIC PREGNANCY;  Surgeon: Silverio Lay, MD;  Location: [facility];  Service: Gynecology;  Laterality: N/A;  . ORIF ORBITAL FRACTURE Left 06/13/2016   Procedure: OPEN REDUCTION INTERNAL FIXATION (ORIF) ORBITAL FRACTURE;  Surgeon: Alena Bills Dillingham, DO;  Location: [facility];  Service: Plastics;  Laterality: Left;    Family History  Problem Relation Age of Onset  . Hypertension Maternal Uncle   . Hypertension Paternal Uncle     Social History   Tobacco Use  . Smoking status: Never Smoker  .  Smokeless tobacco: Never Used  Substance Use Topics  . Alcohol use: No  . Drug use: No    Allergies:  Allergies  Allergen Reactions  . Shellfish Allergy Hives    Medications Prior to Admission  Medication Sig Dispense Refill Last Dose  . acetaminophen (TYLENOL) 325 MG tablet Take 650 mg by mouth every 6 (six) hours as needed for mild pain or headache.   11/16/2017 at 0500  . Prenatal Vit-Fe Fumarate-FA (PRENATAL MULTIVITAMIN) TABS tablet Take 1 tablet by mouth daily at 12 noon.   11/15/2017 at Unknown time  . ondansetron (ZOFRAN) 4 MG tablet Take 1 tablet (4 mg total) by mouth daily as needed for nausea or vomiting. (Patient not taking: Reported on 09/21/2017) 30 tablet 1 Not Taking at Unknown time    Review of Systems  Constitutional: Negative for fever.  Gastrointestinal: Positive for abdominal pain. Negative for constipation, diarrhea, nausea and vomiting.  Genitourinary: Negative for vaginal bleeding and vaginal discharge.   Physical Exam   Blood pressure 131/63, pulse (!) 103, temperature 99.1 F (37.3 C), temperature source Oral, resp. rate 18, height 5\' 6"  (1.676 m), weight 213 lb (96.6 kg), last menstrual period 01/16/2017, SpO2  98 %, unknown if currently breastfeeding.  Physical Exam  Nursing note and vitals reviewed. Constitutional: She is oriented to person, place, and time. She appears well-developed and well-nourished. No distress.  HENT:  Head: Normocephalic and atraumatic.  Cardiovascular: Tachycardia present.  Respiratory: Effort normal.  GI: Soft. She exhibits no distension and no mass. There is no tenderness. There is no rebound and no guarding.  Neurological: She is alert and oriented to person, place, and time.  Skin: Skin is warm and dry. No erythema.  Psychiatric: She has a normal mood and affect.  Dilation: 2 Effacement (%): 50 Exam by:: Magnus SinningWenzel, J, PA   Results for orders placed or performed during the hospital encounter of 11/16/17 (from the past 24  hour(s))  Urinalysis, Routine w reflex microscopic     Status: Abnormal   Collection Time: 11/16/17  8:13 AM  Result Value Ref Range   Color, Urine YELLOW YELLOW   APPearance CLOUDY (A) CLEAR   Specific Gravity, Urine 1.014 1.005 - 1.030   pH 7.0 5.0 - 8.0   Glucose, UA NEGATIVE NEGATIVE mg/dL   Hgb urine dipstick NEGATIVE NEGATIVE   Bilirubin Urine NEGATIVE NEGATIVE   Ketones, ur NEGATIVE NEGATIVE mg/dL   Protein, ur NEGATIVE NEGATIVE mg/dL   Nitrite NEGATIVE NEGATIVE   Leukocytes, UA TRACE (A) NEGATIVE   RBC / HPF 0-5 0 - 5 RBC/hpf   WBC, UA 0-5 0 - 5 WBC/hpf   Bacteria, UA NONE SEEN NONE SEEN   Squamous Epithelial / LPF 0-5 (A) NONE SEEN   Mucus PRESENT     Fetal Monitoring: Baseline: 140 bpm Variability: moderate Accelerations: 15 x 15 Decelerations: none Contractions: q 4 minutes, irregular  MAU Course  Procedures None  MDM UA today Dsicussed patient with Dr. Charlotta Newtonzan. IV LR bolus, Procardia 10 mg q 20 mins x 3 PRN for contractions, BMZ today  Patient no longer having contractions noted on TOCO and reports significant improvement in symptoms.  Discussed patient update with Dr. Charlotta Newtonzan. Agrees with plan for discharge at this time. Return to MAU tomorrow for BMZ injection. Rx for Procardia #5 and call if needing frequently. Follow-up in the office next week. Patient states has an appointment on 11/19/17.  Assessment and Plan  A: SIUP at 492w2d Preterm contractions   P:  Discharge home Rx for Procardia given to patient  Preterm labor precautions discussed Patient to return to MAU tomorrow for BMZ Patient advised to follow-up with [facility] as planned next week Patient may return to MAU as needed or if her condition were to change or worsen  Vonzella NippleJulie Rasheida Broden, PA-C 11/16/2017, 10:49 AM
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
## 2017-11-17 ENCOUNTER — Inpatient Hospital Stay (HOSPITAL_COMMUNITY)
Admission: AD | Admit: 2017-11-17 | Discharge: 2017-11-17 | Disposition: A | Discharge status: Home / Self Care | Payer: BLUE CROSS/BLUE SHIELD | Source: Ambulatory Visit | Attending: Obstetrics & Gynecology | Admitting: Obstetrics & Gynecology

## 2017-11-17 DIAGNOSIS — O4703 False labor before 37 completed weeks of gestation, third trimester: Secondary | ICD-10-CM | POA: Insufficient documentation

## 2017-11-17 DIAGNOSIS — Z3A34 34 weeks gestation of pregnancy: Secondary | ICD-10-CM | POA: Insufficient documentation

## 2017-11-17 LAB — CULTURE, BETA STREP (GROUP B ONLY)

## 2017-11-17 MED ORDER — BETAMETHASONE SOD PHOS & ACET 6 (3-3) MG/ML IJ SUSP
12.0000 mg | Freq: Once | INTRAMUSCULAR | Status: AC
  Administered 2017-11-17: 12 mg via INTRAMUSCULAR
  Filled 2017-11-17: qty 2

## 2017-11-17 NOTE — MAU Note (Signed)
Pt here for 2nd BMZ shot, not other issues.

## 2017-11-25 ENCOUNTER — Other Ambulatory Visit: Payer: Self-pay

## 2017-11-25 ENCOUNTER — Encounter (HOSPITAL_COMMUNITY): Payer: Self-pay

## 2017-11-25 ENCOUNTER — Inpatient Hospital Stay (HOSPITAL_COMMUNITY)
Admission: AD | Admit: 2017-11-25 | Discharge: 2017-11-25 | Disposition: A | Discharge status: Home / Self Care | Payer: BLUE CROSS/BLUE SHIELD | Source: Ambulatory Visit | Attending: Obstetrics & Gynecology | Admitting: Obstetrics & Gynecology

## 2017-11-25 DIAGNOSIS — R51 Headache: Secondary | ICD-10-CM | POA: Diagnosis not present

## 2017-11-25 DIAGNOSIS — O9989 Other specified diseases and conditions complicating pregnancy, childbirth and the puerperium: Secondary | ICD-10-CM | POA: Diagnosis not present

## 2017-11-25 DIAGNOSIS — Z3A35 35 weeks gestation of pregnancy: Secondary | ICD-10-CM | POA: Diagnosis not present

## 2017-11-25 DIAGNOSIS — O26893 Other specified pregnancy related conditions, third trimester: Secondary | ICD-10-CM | POA: Insufficient documentation

## 2017-11-25 DIAGNOSIS — R030 Elevated blood-pressure reading, without diagnosis of hypertension: Secondary | ICD-10-CM | POA: Diagnosis present

## 2017-11-25 DIAGNOSIS — R519 Headache, unspecified: Secondary | ICD-10-CM

## 2017-11-25 LAB — PROTEIN / CREATININE RATIO, URINE
Creatinine, Urine: 51 mg/dL
PROTEIN CREATININE RATIO: 0.12 mg/mg{creat} (ref 0.00–0.15)
Total Protein, Urine: 6 mg/dL

## 2017-11-25 LAB — COMPREHENSIVE METABOLIC PANEL
ALBUMIN: 3 g/dL — AB (ref 3.5–5.0)
ALK PHOS: 146 U/L — AB (ref 38–126)
ALT: 18 U/L (ref 14–54)
ANION GAP: 9 (ref 5–15)
AST: 18 U/L (ref 15–41)
BUN: 5 mg/dL — ABNORMAL LOW (ref 6–20)
CO2: 22 mmol/L (ref 22–32)
Calcium: 8.6 mg/dL — ABNORMAL LOW (ref 8.9–10.3)
Chloride: 104 mmol/L (ref 101–111)
Creatinine, Ser: 0.48 mg/dL (ref 0.44–1.00)
GFR calc Af Amer: >60 mL/min (ref >60)
GFR calc non Af Amer: >60 mL/min (ref >60)
Glucose, Bld: 82 mg/dL (ref 65–99)
POTASSIUM: 3.8 mmol/L (ref 3.5–5.1)
SODIUM: 135 mmol/L (ref 135–145)
Total Bilirubin: <0.1 mg/dL — ABNORMAL LOW (ref 0.3–1.2)
Total Protein: 6.8 g/dL (ref 6.5–8.1)

## 2017-11-25 LAB — CBC
HCT: 30.7 % — ABNORMAL LOW (ref 36.0–46.0)
HEMOGLOBIN: 9.8 g/dL — AB (ref 12.0–15.0)
MCH: 25.5 pg — ABNORMAL LOW (ref 26.0–34.0)
MCHC: 31.9 g/dL (ref 30.0–36.0)
MCV: 79.7 fL (ref 78.0–100.0)
Platelets: 287 10*3/uL (ref 150–400)
RBC: 3.85 MIL/uL — ABNORMAL LOW (ref 3.87–5.11)
RDW: 15.5 % (ref 11.5–15.5)
WBC: 12.6 10*3/uL — AB (ref 4.0–10.5)

## 2017-11-25 NOTE — MAU Provider Note (Signed)
History     CSN: [id number]  Arrival date and time: 11/25/17 1222   First Provider Initiated Contact with Patient 11/25/17 1252      Chief Complaint  Patient presents with  . Hypertension  . Headache   HPI Kristin Lyons 27 y.o. [redacted]w[redacted]d  Was sent from the office today for evaluation for high blood pressure in the office and recent headaches.  Some edema in lower legs.  No leaking of fluid and baby is moving well.  Has an appointment in the office on Thursday for NST.  In twice weekly testing likely due to previous second trimester loss.    OB History    Gravida  6   Para  2   Term  1   Preterm  0   AB  3   Living  1     SAB  2   TAB  0   Ectopic  1   Multiple  0   Live Births  1           Past Medical History:  Diagnosis Date  . Asthma   . Asthma   . Bacterial infection   . History of chicken pox   . Trichomonas   . Yeast infection     Past Surgical History:  Procedure Laterality Date  . DILATION AND CURETTAGE OF UTERUS    . DILATION AND CURETTAGE OF UTERUS N/A 09/17/2014   Procedure: DILATATION AND CURETTAGE;  Surgeon: Purcell Nails, MD;  Location: [facility];  Service: Gynecology;  Laterality: N/A;  . DILATION AND EVACUATION N/A 09/17/2014   Procedure: DILATATION AND EVACUATION;  Surgeon: Purcell Nails, MD;  Location[facility];  Service: Gynecology;  Laterality: N/A;  . LAPAROSCOPY N/A 03/18/2017   Procedure: LAPAROSCOPY OPERATIVE WITH REMOVAL LEFT ECTOPIC PREGNANCY;  Surgeon: Silverio Lay, MD;  Location: [facility];  Service: Gynecology;  Laterality: N/A;  . ORIF ORBITAL FRACTURE Left 06/13/2016   Procedure: OPEN REDUCTION INTERNAL FIXATION (ORIF) ORBITAL FRACTURE;  Surgeon: Alena Bills Dillingham, DO;  Location: [facility];  Service: Plastics;  Laterality: Left;    Family History  Problem Relation Age of Onset  . Hypertension Maternal Uncle   . Hypertension Paternal Uncle     Social History   Tobacco Use  . Smoking status: Never Smoker  .  Smokeless tobacco: Never Used  Substance Use Topics  . Alcohol use: No  . Drug use: No    Allergies:  Allergies  Allergen Reactions  . Shellfish Allergy Hives    Medications Prior to Admission  Medication Sig Dispense Refill Last Dose  . acetaminophen (TYLENOL) 325 MG tablet Take 650 mg by mouth every 6 (six) hours as needed for mild pain or headache.   Past Week at Unknown time  . Prenatal Vit-Fe Fumarate-FA (PRENATAL MULTIVITAMIN) TABS tablet Take 1 tablet by mouth daily at 12 noon.   11/25/2017 at Unknown time  . NIFEdipine (PROCARDIA) 20 MG capsule Take 1 capsule (20 mg total) by mouth 3 (three) times daily as needed. (Patient not taking: Reported on 11/25/2017) 5 capsule 0 Not Taking at Unknown time  . ondansetron (ZOFRAN) 4 MG tablet Take 1 tablet (4 mg total) by mouth daily as needed for nausea or vomiting. (Patient not taking: Reported on 09/21/2017) 30 tablet 1 Not Taking at Unknown time    Review of Systems  Constitutional: Negative for fever.  Gastrointestinal: Negative for abdominal pain, nausea and vomiting.  Genitourinary: Negative for dysuria.  Neurological: Positive for  headaches.   Physical Exam   Blood pressure 123/65, pulse 85, temperature 98.6 F (37 C), temperature source Oral, resp. rate 16, weight 205 lb 8 oz (93.2 kg), last menstrual period 01/16/2017, SpO2 99 %, unknown if currently breastfeeding.  Physical Exam  Nursing note and vitals reviewed. Constitutional: She is oriented to person, place, and time. She appears well-developed and well-nourished.  HENT:  Head: Normocephalic.  Eyes: EOM are normal.  Neck: Neck supple.  Respiratory: Effort normal.  GI: Soft. There is no tenderness. There is no rebound and no guarding.  Musculoskeletal: Normal range of motion.  Neurological: She is alert and oriented to person, place, and time.  Skin: Skin is warm and dry.  Psychiatric: She has a normal mood and affect.   Vitals:   11/25/17 1346 11/25/17 1421   BP: 123/65   Pulse: 85   Resp:  16  Temp:    SpO2:       MAU Course  Procedures Results for orders placed or performed during the hospital encounter of 11/25/17 (from the past 24 hour(s))  Protein / creatinine ratio, urine     Status: None   Collection Time: 11/25/17 12:49 PM  Result Value Ref Range   Creatinine, Urine 51.00 mg/dL   Total Protein, Urine 6 mg/dL   Protein Creatinine Ratio 0.12 0.00 - 0.15 mg/mg[Cre]  CBC     Status: Abnormal   Collection Time: 11/25/17  1:06 PM  Result Value Ref Range   WBC 12.6 (H) 4.0 - 10.5 K/uL   RBC 3.85 (L) 3.87 - 5.11 MIL/uL   Hemoglobin 9.8 (L) 12.0 - 15.0 g/dL   HCT 16.1 (L) 09.6 - 04.5 %   MCV 79.7 78.0 - 100.0 fL   MCH 25.5 (L) 26.0 - 34.0 pg   MCHC 31.9 30.0 - 36.0 g/dL   RDW 40.9 81.1 - 91.4 %   Platelets 287 150 - 400 K/uL  Comprehensive metabolic panel     Status: Abnormal   Collection Time: 11/25/17  1:06 PM  Result Value Ref Range   Sodium 135 135 - 145 mmol/L   Potassium 3.8 3.5 - 5.1 mmol/L   Chloride 104 101 - 111 mmol/L   CO2 22 22 - 32 mmol/L   Glucose, Bld 82 65 - 99 mg/dL   BUN 5 (L) 6 - 20 mg/dL   Creatinine, Ser 7.82 0.44 - 1.00 mg/dL   Calcium 8.6 (L) 8.9 - 10.3 mg/dL   Total Protein 6.8 6.5 - 8.1 g/dL   Albumin 3.0 (L) 3.5 - 5.0 g/dL   AST 18 15 - 41 U/L   ALT 18 14 - 54 U/L   Alkaline Phosphatase 146 (H) 38 - 126 U/L   Total Bilirubin <0.1 (L) 0.3 - 1.2 mg/dL   GFR calc non Af Amer >60 >60 mL/min   GFR calc Af Amer >60 >60 mL/min   Anion gap 9 5 - 15    MDM Discussed plan of care with Dr. Sallye Ober - client will go home as BPs have been normal and labs are all normal at this time.    Assessment and Plan  Headaches in pregnancy - blood pressures have been normal in MAU, PIH labs are normal [redacted]w[redacted]d  Plan Follow up in the office as scheduled. Call the office if you develop any visual changes, worsening edema, or RUQ pain Drink at least 8 8-oz glasses of water every day. Take Tylenol 325 mg 2 tablets  by mouth every 4 hours if  needed for pain.  Alivya Wegman L Quentina Fronek 11/25/2017, 2:26 PM

## 2017-11-25 NOTE — MAU Note (Signed)
Had a dr's appt. During her NST, she was having a headache.  BP was up. Denies visual changes or epigastric pain. Reports increased swelling in feet.

## 2017-11-25 NOTE — Discharge Instructions (Signed)
Drink at least 8 8-oz glasses of water every day. Follow up in the office as scheduled. Call the office if you develop any visual changes, worsening edema, or Right Upper Quadrant pain

## 2017-11-26 ENCOUNTER — Inpatient Hospital Stay (HOSPITAL_COMMUNITY)
Admission: AD | Admit: 2017-11-26 | Discharge: 2017-11-27 | Disposition: A | Discharge status: Home / Self Care | Payer: BLUE CROSS/BLUE SHIELD | Source: Ambulatory Visit | Attending: Obstetrics and Gynecology | Admitting: Obstetrics and Gynecology

## 2017-11-26 ENCOUNTER — Encounter (HOSPITAL_COMMUNITY): Payer: Self-pay | Admitting: *Deleted

## 2017-11-26 DIAGNOSIS — O26893 Other specified pregnancy related conditions, third trimester: Secondary | ICD-10-CM | POA: Insufficient documentation

## 2017-11-26 DIAGNOSIS — B373 Candidiasis of vulva and vagina: Secondary | ICD-10-CM | POA: Diagnosis not present

## 2017-11-26 DIAGNOSIS — Z3A35 35 weeks gestation of pregnancy: Secondary | ICD-10-CM | POA: Insufficient documentation

## 2017-11-26 DIAGNOSIS — O26853 Spotting complicating pregnancy, third trimester: Secondary | ICD-10-CM | POA: Insufficient documentation

## 2017-11-26 DIAGNOSIS — O98813 Other maternal infectious and parasitic diseases complicating pregnancy, third trimester: Secondary | ICD-10-CM | POA: Diagnosis not present

## 2017-11-26 DIAGNOSIS — O26899 Other specified pregnancy related conditions, unspecified trimester: Secondary | ICD-10-CM | POA: Diagnosis present

## 2017-11-26 DIAGNOSIS — R51 Headache: Secondary | ICD-10-CM | POA: Diagnosis not present

## 2017-11-26 DIAGNOSIS — R519 Headache, unspecified: Secondary | ICD-10-CM | POA: Diagnosis present

## 2017-11-26 DIAGNOSIS — B3731 Acute candidiasis of vulva and vagina: Secondary | ICD-10-CM | POA: Diagnosis present

## 2017-11-26 LAB — COMPREHENSIVE METABOLIC PANEL
ALT: 19 U/L (ref 14–54)
ANION GAP: 9 (ref 5–15)
AST: 21 U/L (ref 15–41)
Albumin: 2.9 g/dL — ABNORMAL LOW (ref 3.5–5.0)
Alkaline Phosphatase: 154 U/L — ABNORMAL HIGH (ref 38–126)
BILIRUBIN TOTAL: 0.3 mg/dL (ref 0.3–1.2)
BUN: 8 mg/dL (ref 6–20)
CHLORIDE: 106 mmol/L (ref 101–111)
CO2: 21 mmol/L — ABNORMAL LOW (ref 22–32)
Calcium: 9.3 mg/dL (ref 8.9–10.3)
Creatinine, Ser: 0.44 mg/dL (ref 0.44–1.00)
GFR calc Af Amer: >60 mL/min (ref >60)
Glucose, Bld: 91 mg/dL (ref 65–99)
POTASSIUM: 4.2 mmol/L (ref 3.5–5.1)
Sodium: 136 mmol/L (ref 135–145)
TOTAL PROTEIN: 6.5 g/dL (ref 6.5–8.1)

## 2017-11-26 LAB — WET PREP, GENITAL
CLUE CELLS WET PREP: NONE SEEN
Sperm: NONE SEEN
TRICH WET PREP: NONE SEEN

## 2017-11-26 LAB — URINALYSIS, ROUTINE W REFLEX MICROSCOPIC
BILIRUBIN URINE: NEGATIVE
Glucose, UA: NEGATIVE mg/dL
HGB URINE DIPSTICK: NEGATIVE
KETONES UR: NEGATIVE mg/dL
Leukocytes, UA: NEGATIVE
Nitrite: NEGATIVE
PROTEIN: NEGATIVE mg/dL
SPECIFIC GRAVITY, URINE: 1.008 (ref 1.005–1.030)
pH: 7 (ref 5.0–8.0)

## 2017-11-26 LAB — CBC
HEMATOCRIT: 29.9 % — AB (ref 36.0–46.0)
Hemoglobin: 9.7 g/dL — ABNORMAL LOW (ref 12.0–15.0)
MCH: 25.9 pg — ABNORMAL LOW (ref 26.0–34.0)
MCHC: 32.4 g/dL (ref 30.0–36.0)
MCV: 79.7 fL (ref 78.0–100.0)
PLATELETS: 288 10*3/uL (ref 150–400)
RBC: 3.75 MIL/uL — ABNORMAL LOW (ref 3.87–5.11)
RDW: 15.5 % (ref 11.5–15.5)
WBC: 12.1 10*3/uL — ABNORMAL HIGH (ref 4.0–10.5)

## 2017-11-26 LAB — PROTEIN / CREATININE RATIO, URINE
CREATININE, URINE: 40 mg/dL
Total Protein, Urine: <6 mg/dL

## 2017-11-26 MED ORDER — TERCONAZOLE 0.4 % VA CREA: 1.0000 | TOPICAL_CREAM | Freq: Every day | VAGINAL | 0 refills | Status: AC

## 2017-11-26 MED ORDER — METOCLOPRAMIDE HCL 5 MG/ML IJ SOLN
10.0000 mg | Freq: Once | INTRAMUSCULAR | Status: AC
  Administered 2017-11-26: 10 mg via INTRAVENOUS
  Filled 2017-11-26: qty 2

## 2017-11-26 MED ORDER — DIPHENHYDRAMINE HCL 50 MG/ML IJ SOLN
25.0000 mg | Freq: Once | INTRAMUSCULAR | Status: AC
  Administered 2017-11-26: 25 mg via INTRAVENOUS
  Filled 2017-11-26: qty 1

## 2017-11-26 MED ORDER — SODIUM CHLORIDE 0.9 % IV SOLN
INTRAVENOUS | Status: DC
Start: 2017-11-26 — End: 2017-11-27
  Administered 2017-11-26: 22:00:00 via INTRAVENOUS

## 2017-11-26 NOTE — MAU Note (Signed)
Contractions and bleeding

## 2017-11-26 NOTE — MAU Provider Note (Signed)
History     CSN: [id number]  Arrival date and time: 11/26/17 2105   First Provider Initiated Contact with Patient 11/26/17 2159      Chief Complaint  Patient presents with  . Contractions   HPI  Ms.  Kristin Lyons is a 27 y.o. year old [id number] female at 42w5dweeks gestation who presents to MAU reporting vaginal spotting with wiping for most of the day, but decided to come be evaluated after it kept happening every time she went to the BR. [facility]  Past Medical History:  Diagnosis Date  . Asthma   . Asthma   . Bacterial infection   . History of chicken pox   . Trichomonas   . Yeast infection     Past Surgical History:  Procedure Laterality Date  . DILATION AND CURETTAGE OF UTERUS    . DILATION AND CURETTAGE OF UTERUS N/A 09/17/2014   Procedure: DILATATION AND CURETTAGE;  Surgeon: ADelice Lesch MD;  [facility];  Service: Gynecology;  Laterality: N/A;  . DILATION AND EVACUATION N/A 09/17/2014   Procedure: DILATATION AND EVACUATION;  Surgeon: ADelice Lesch MD;  [facility];  Service: Gynecology;  Laterality: N/A;  . LAPAROSCOPY N/A 03/18/2017   Procedure: LAPAROSCOPY OPERATIVE WITH REMOVAL LEFT ECTOPIC PREGNANCY;  Surgeon: RDelsa Bern MD;  [facility];  Service: Gynecology;  Laterality: N/A;  . ORIF ORBITAL FRACTURE Left 06/13/2016   Procedure: OPEN REDUCTION INTERNAL FIXATION (ORIF) ORBITAL FRACTURE;  Surgeon: CLoel LoftyDillingham, DO;  [facility]  Service: Plastics;  Laterality: Left;    Family History  Problem Relation Age of Onset  . Hypertension Maternal Uncle   . Hypertension Paternal Uncle     Social History   Tobacco Use  . Smoking status: Never Smoker  . Smokeless tobacco: Never Used  Substance Use Topics  . Alcohol use: No  . Drug use: No    Allergies:  Allergies  Allergen Reactions  . Shellfish Allergy Hives    Medications Prior to Admission  Medication Sig Dispense Refill  Last Dose  . acetaminophen (TYLENOL) 325 MG tablet Take 650 mg by mouth every 6 (six) hours as needed for mild pain or headache.   Past Week at Unknown time  . Prenatal Vit-Fe Fumarate-FA (PRENATAL MULTIVITAMIN) TABS tablet Take 1 tablet by mouth daily at 12 noon.   11/25/2017 at Unknown time    Review of Systems  Constitutional: Negative.   HENT: Negative.   Eyes: Negative.   Respiratory: Negative.   Cardiovascular: Negative.   Gastrointestinal: Negative.   Endocrine: Negative.   Genitourinary: Positive for vaginal bleeding. Vaginal discharge: spotting with wiping.  Musculoskeletal: Negative.   Skin: Negative.   Allergic/Immunologic: Negative.   Neurological: Positive for headaches.  Hematological: Negative.   Psychiatric/Behavioral: Negative.    Physical Exam   Patient Vitals for the past 24 hrs:  BP Temp Temp src Pulse Resp SpO2  11/26/17 2146 137/69 - - 93 - -  11/26/17 2130 132/60 - - 91 - -  11/26/17 2127 - - - - - 97 %  11/26/17 2126 (!) 145/70 - - (!) 101 - -  11/26/17 2121 (!) 148/67 98.1 F (36.7 C) Oral 94 18 -     Physical Exam  Nursing note and vitals reviewed. Constitutional: She is oriented to person, place, and time. She appears well-developed and well-nourished.  HENT:  Head: Normocephalic and atraumatic.  Eyes: Pupils are equal, round, and reactive  to light.  Neck: Normal range of motion.  Cardiovascular: Normal rate, regular rhythm and normal heart sounds.  Respiratory: Effort normal and breath sounds normal.  GI: Soft. Bowel sounds are normal.  Genitourinary:  Genitourinary Comments: Uterus: gravid, S=D, SE: cervix is smooth, pink, no lesions, scant amount of pink blood in vaginal vault, copious amount of thick, yellowish-green cottage cheese d/c adherent to vaginal walls -- WP done, no CMT or friability, no adnexal tenderness Dilation: 3 Effacement (%): 70 Station: -2 Exam by: Sunday Corn, CNM   Musculoskeletal: Normal range of motion.   Neurological: She is alert and oriented to person, place, and time.  Skin: Skin is warm and dry.  Psychiatric: She has a normal mood and affect. Her behavior is normal. Judgment and thought content normal.    MAU Course  Procedures  MDM CCUA P/C Ratio by I&O cath H/A Protocol: 0.9% NS 1000 ml bolus with slow IVP of Benadryl 25 mg & Reglan 10 mg --  NST - FHR: 140 bpm / moderate variability / accels present / decels absent / TOCO: irregular every 20 mins with UI noted   *Consult with Dr. Mancel Bale @ 2215 - notified of patient's complaints, assessments - orders received for H/A protocol EXCLUDING Decadron 10 mg, repeat PEC labs // TC to Dr. Mancel Bale at 2350 to give BPs, lab results & that H/A has improved  Results for orders placed or performed during the hospital encounter of 11/26/17 (from the past 72 hour(s))  Wet prep, genital     Status: Abnormal   Collection Time: 11/26/17  9:57 PM  Result Value Ref Range   Yeast Wet Prep HPF POC PRESENT (A) NONE SEEN   Trich, Wet Prep NONE SEEN NONE SEEN   Clue Cells Wet Prep HPF POC NONE SEEN NONE SEEN   WBC, Wet Prep HPF POC MANY (A) NONE SEEN    Comment: BACTERIA- TOO NUMEROUS TO COUNT   Sperm NONE SEEN     Comment: Performed at [facility], [address] [place], [place] [address]  CBC     Status: Abnormal   Collection Time: 11/26/17 10:27 PM  Result Value Ref Range   WBC 12.1 (H) 4.0 - 10.5 K/uL   RBC 3.75 (L) 3.87 - 5.11 MIL/uL   Hemoglobin 9.7 (L) 12.0 - 15.0 g/dL   HCT 29.9 (L) 36.0 - 46.0 %   MCV 79.7 78.0 - 100.0 fL   MCH 25.9 (L) 26.0 - 34.0 pg   MCHC 32.4 30.0 - 36.0 g/dL   RDW 15.5 11.5 - 15.5 %   Platelets 288 150 - 400 K/uL    Comment: Performed at [facility], [address] [place], [place] [address]  Comprehensive metabolic panel     Status: Abnormal   Collection Time: 11/26/17 10:27 PM  Result Value Ref Range   Sodium 136 135 - 145 mmol/L   Potassium 4.2 3.5 - 5.1 mmol/L   Chloride 106 101 - 111 mmol/L    CO2 21 (L) 22 - 32 mmol/L   Glucose, Bld 91 65 - 99 mg/dL   BUN 8 6 - 20 mg/dL   Creatinine, Ser 0.44 0.44 - 1.00 mg/dL   Calcium 9.3 8.9 - 10.3 mg/dL   Total Protein 6.5 6.5 - 8.1 g/dL   Albumin 2.9 (L) 3.5 - 5.0 g/dL   AST 21 15 - 41 U/L   ALT 19 14 - 54 U/L   Alkaline Phosphatase 154 (H) 38 - 126 U/L   Total  Bilirubin 0.3 0.3 - 1.2 mg/dL   GFR calc non Af Amer >60 >60 mL/min   GFR calc Af Amer >60 >60 mL/min    Comment: (NOTE) The eGFR has been calculated using the CKD EPI equation. This calculation has not been validated in all clinical situations. eGFR's persistently <60 mL/min signify possible Chronic Kidney Disease.    Anion gap 9 5 - 15    Comment: Performed at [facility], [address], [place], [place] [address]  Protein / creatinine ratio, urine     Status: None   Collection Time: 11/26/17 10:43 PM  Result Value Ref Range   Creatinine, Urine 40.00 mg/dL   Total Protein, Urine <6 mg/dL    Comment: REPEATED TO VERIFY   Protein Creatinine Ratio        0.00 - 0.15 mg/mg[Cre]    Comment: RESULT BELOW REPORTABLE RANGE, UNABLE TO CALCULATE. Performed at [facility], [address] [place], [place] [address]   Urinalysis, Routine w reflex microscopic     Status: Abnormal   Collection Time: 11/26/17 10:43 PM  Result Value Ref Range   Color, Urine STRAW (A) YELLOW   APPearance CLEAR CLEAR   Specific Gravity, Urine 1.008 1.005 - 1.030   pH 7.0 5.0 - 8.0   Glucose, UA NEGATIVE NEGATIVE mg/dL   Hgb urine dipstick NEGATIVE NEGATIVE   Bilirubin Urine NEGATIVE NEGATIVE   Ketones, ur NEGATIVE NEGATIVE mg/dL   Protein, ur NEGATIVE NEGATIVE mg/dL   Nitrite NEGATIVE NEGATIVE   Leukocytes, UA NEGATIVE NEGATIVE    Comment: Performed at [facility], [address] [place], [place] [address]     Assessment and Plan  Pregnancy headache in third trimester  - Keep OB appts with [facility] - Labor precautions reviewed  Candida vaginitis  - Rx Terazol 0.4% pv hs x  5 days - Information provided on yeast infection   - Discharge patient - Patient verbalized an understanding of the plan of care and agrees.    Laury Deep, MSN, CNM 11/26/2017, 10:00 PM

## 2017-11-27 ENCOUNTER — Encounter (HOSPITAL_COMMUNITY): Payer: Self-pay | Admitting: *Deleted

## 2017-12-09 ENCOUNTER — Other Ambulatory Visit: Payer: Self-pay | Admitting: Obstetrics & Gynecology

## 2017-12-09 ENCOUNTER — Inpatient Hospital Stay (HOSPITAL_COMMUNITY): Payer: BLUE CROSS/BLUE SHIELD | Admitting: Anesthesiology

## 2017-12-09 ENCOUNTER — Encounter (HOSPITAL_COMMUNITY): Payer: Self-pay

## 2017-12-09 ENCOUNTER — Inpatient Hospital Stay (HOSPITAL_COMMUNITY)
Admission: AD | Admit: 2017-12-09 | Discharge: 2017-12-11 | DRG: 807 | Disposition: A | Discharge status: Home / Self Care | Payer: BLUE CROSS/BLUE SHIELD | Source: Ambulatory Visit | Attending: Obstetrics & Gynecology | Admitting: Obstetrics & Gynecology

## 2017-12-09 DIAGNOSIS — B3731 Acute candidiasis of vulva and vagina: Secondary | ICD-10-CM

## 2017-12-09 DIAGNOSIS — O43123 Velamentous insertion of umbilical cord, third trimester: Secondary | ICD-10-CM | POA: Diagnosis present

## 2017-12-09 DIAGNOSIS — Z3A38 38 weeks gestation of pregnancy: Secondary | ICD-10-CM | POA: Diagnosis not present

## 2017-12-09 DIAGNOSIS — O99824 Streptococcus B carrier state complicating childbirth: Secondary | ICD-10-CM | POA: Diagnosis present

## 2017-12-09 DIAGNOSIS — Z3483 Encounter for supervision of other normal pregnancy, third trimester: Secondary | ICD-10-CM | POA: Diagnosis present

## 2017-12-09 DIAGNOSIS — B373 Candidiasis of vulva and vagina: Secondary | ICD-10-CM

## 2017-12-09 LAB — COMPREHENSIVE METABOLIC PANEL
ALT: 20 U/L (ref 14–54)
ANION GAP: 12 (ref 5–15)
AST: 23 U/L (ref 15–41)
Albumin: 2.9 g/dL — ABNORMAL LOW (ref 3.5–5.0)
Alkaline Phosphatase: 164 U/L — ABNORMAL HIGH (ref 38–126)
BUN: 6 mg/dL (ref 6–20)
CALCIUM: 9 mg/dL (ref 8.9–10.3)
CHLORIDE: 104 mmol/L (ref 101–111)
CO2: 19 mmol/L — AB (ref 22–32)
CREATININE: 0.43 mg/dL — AB (ref 0.44–1.00)
Glucose, Bld: 88 mg/dL (ref 65–99)
Potassium: 3.6 mmol/L (ref 3.5–5.1)
SODIUM: 135 mmol/L (ref 135–145)
Total Bilirubin: 0.2 mg/dL — ABNORMAL LOW (ref 0.3–1.2)
Total Protein: 6.4 g/dL — ABNORMAL LOW (ref 6.5–8.1)

## 2017-12-09 LAB — CBC
HCT: 33.7 % — ABNORMAL LOW (ref 36.0–46.0)
Hemoglobin: 10.8 g/dL — ABNORMAL LOW (ref 12.0–15.0)
MCH: 25.8 pg — ABNORMAL LOW (ref 26.0–34.0)
MCHC: 32 g/dL (ref 30.0–36.0)
MCV: 80.6 fL (ref 78.0–100.0)
PLATELETS: 291 10*3/uL (ref 150–400)
RBC: 4.18 MIL/uL (ref 3.87–5.11)
RDW: 15.8 % — ABNORMAL HIGH (ref 11.5–15.5)
WBC: 11.5 10*3/uL — AB (ref 4.0–10.5)

## 2017-12-09 LAB — TYPE AND SCREEN
ABO/RH(D): O POS
Antibody Screen: NEGATIVE

## 2017-12-09 LAB — PROTEIN / CREATININE RATIO, URINE
Creatinine, Urine: 53 mg/dL
PROTEIN CREATININE RATIO: 0.15 mg/mg{creat} (ref 0.00–0.15)
TOTAL PROTEIN, URINE: 8 mg/dL

## 2017-12-09 MED ORDER — ACETAMINOPHEN 325 MG PO TABS
650.0000 mg | ORAL_TABLET | ORAL | Status: DC | PRN
  Administered 2017-12-10 – 2017-12-11 (×3): 650 mg via ORAL
  Filled 2017-12-09: qty 2

## 2017-12-09 MED ORDER — FLEET ENEMA 7-19 GM/118ML RE ENEM: 1.0000 | ENEMA | RECTAL | Status: DC | PRN

## 2017-12-09 MED ORDER — LACTATED RINGERS IV SOLN: 500.0000 mL | Freq: Once | INTRAVENOUS | Status: DC

## 2017-12-09 MED ORDER — PRENATAL MULTIVITAMIN CH
1.0000 | ORAL_TABLET | Freq: Every day | ORAL | Status: DC
  Administered 2017-12-10 – 2017-12-11 (×2): 1 via ORAL
  Filled 2017-12-09 (×2): qty 1

## 2017-12-09 MED ORDER — LIDOCAINE HCL (PF) 1 % IJ SOLN
INTRAMUSCULAR | Status: DC | PRN
  Administered 2017-12-09 (×2): 5 mL via EPIDURAL

## 2017-12-09 MED ORDER — DIPHENHYDRAMINE HCL 25 MG PO CAPS: 25.0000 mg | ORAL_CAPSULE | Freq: Four times a day (QID) | ORAL | Status: DC | PRN

## 2017-12-09 MED ORDER — DIBUCAINE 1 % RE OINT: 1.0000 "application " | TOPICAL_OINTMENT | RECTAL | Status: DC | PRN

## 2017-12-09 MED ORDER — LACTATED RINGERS IV SOLN
500.0000 mL | Freq: Once | INTRAVENOUS | Status: AC
  Administered 2017-12-09: 500 mL via INTRAVENOUS

## 2017-12-09 MED ORDER — WITCH HAZEL-GLYCERIN EX PADS: 1.0000 "application " | MEDICATED_PAD | CUTANEOUS | Status: DC | PRN

## 2017-12-09 MED ORDER — SIMETHICONE 80 MG PO CHEW: 80.0000 mg | CHEWABLE_TABLET | ORAL | Status: DC | PRN

## 2017-12-09 MED ORDER — ONDANSETRON HCL 4 MG PO TABS: 4.0000 mg | ORAL_TABLET | ORAL | Status: DC | PRN

## 2017-12-09 MED ORDER — SOD CITRATE-CITRIC ACID 500-334 MG/5ML PO SOLN: 30.0000 mL | ORAL | Status: DC | PRN

## 2017-12-09 MED ORDER — PHENYLEPHRINE 40 MCG/ML (10ML) SYRINGE FOR IV PUSH (FOR BLOOD PRESSURE SUPPORT)
80.0000 ug | PREFILLED_SYRINGE | INTRAVENOUS | Status: DC | PRN
  Filled 2017-12-09: qty 5

## 2017-12-09 MED ORDER — LACTATED RINGERS IV SOLN
INTRAVENOUS | Status: DC
  Administered 2017-12-09: 19:00:00 via INTRAVENOUS

## 2017-12-09 MED ORDER — EPHEDRINE 5 MG/ML INJ
10.0000 mg | INTRAVENOUS | Status: DC | PRN
  Filled 2017-12-09: qty 2

## 2017-12-09 MED ORDER — OXYCODONE-ACETAMINOPHEN 5-325 MG PO TABS: 2.0000 | ORAL_TABLET | ORAL | Status: DC | PRN

## 2017-12-09 MED ORDER — FENTANYL 2.5 MCG/ML BUPIVACAINE 1/10 % EPIDURAL INFUSION (WH - ANES)
14.0000 mL/h | INTRAMUSCULAR | Status: DC | PRN
  Administered 2017-12-09: 14 mL/h via EPIDURAL
  Filled 2017-12-09: qty 100

## 2017-12-09 MED ORDER — OXYTOCIN 40 UNITS IN LACTATED RINGERS INFUSION - SIMPLE MED
2.5000 [IU]/h | INTRAVENOUS | Status: DC
  Filled 2017-12-09: qty 1000

## 2017-12-09 MED ORDER — COCONUT OIL OIL: 1.0000 "application " | TOPICAL_OIL | Status: DC | PRN

## 2017-12-09 MED ORDER — DIPHENHYDRAMINE HCL 50 MG/ML IJ SOLN: 12.5000 mg | INTRAMUSCULAR | Status: DC | PRN

## 2017-12-09 MED ORDER — ONDANSETRON HCL 4 MG/2ML IJ SOLN: 4.0000 mg | Freq: Four times a day (QID) | INTRAMUSCULAR | Status: DC | PRN

## 2017-12-09 MED ORDER — BENZOCAINE-MENTHOL 20-0.5 % EX AERO
1.0000 "application " | INHALATION_SPRAY | CUTANEOUS | Status: DC | PRN
  Administered 2017-12-09 – 2017-12-11 (×2): 1 via TOPICAL
  Filled 2017-12-09 (×2): qty 56

## 2017-12-09 MED ORDER — ACETAMINOPHEN 325 MG PO TABS
650.0000 mg | ORAL_TABLET | ORAL | Status: DC | PRN
  Filled 2017-12-09 (×2): qty 2

## 2017-12-09 MED ORDER — LACTATED RINGERS IV SOLN: 500.0000 mL | INTRAVENOUS | Status: DC | PRN

## 2017-12-09 MED ORDER — OXYTOCIN BOLUS FROM INFUSION
500.0000 mL | Freq: Once | INTRAVENOUS | Status: AC
  Administered 2017-12-09: 500 mL via INTRAVENOUS

## 2017-12-09 MED ORDER — TETANUS-DIPHTH-ACELL PERTUSSIS 5-2.5-18.5 LF-MCG/0.5 IM SUSP: 0.5000 mL | Freq: Once | INTRAMUSCULAR | Status: DC

## 2017-12-09 MED ORDER — ZOLPIDEM TARTRATE 5 MG PO TABS: 5.0000 mg | ORAL_TABLET | Freq: Every evening | ORAL | Status: DC | PRN

## 2017-12-09 MED ORDER — IBUPROFEN 600 MG PO TABS
600.0000 mg | ORAL_TABLET | Freq: Four times a day (QID) | ORAL | Status: DC
  Administered 2017-12-09 – 2017-12-11 (×6): 600 mg via ORAL
  Filled 2017-12-09 (×7): qty 1

## 2017-12-09 MED ORDER — PHENYLEPHRINE 40 MCG/ML (10ML) SYRINGE FOR IV PUSH (FOR BLOOD PRESSURE SUPPORT)
80.0000 ug | PREFILLED_SYRINGE | INTRAVENOUS | Status: DC | PRN
  Filled 2017-12-09: qty 10
  Filled 2017-12-09: qty 5

## 2017-12-09 MED ORDER — OXYCODONE-ACETAMINOPHEN 5-325 MG PO TABS: 1.0000 | ORAL_TABLET | ORAL | Status: DC | PRN

## 2017-12-09 MED ORDER — ONDANSETRON HCL 4 MG/2ML IJ SOLN: 4.0000 mg | INTRAMUSCULAR | Status: DC | PRN

## 2017-12-09 MED ORDER — SODIUM CHLORIDE 0.9 % IV SOLN
2.0000 g | Freq: Once | INTRAVENOUS | Status: AC
  Administered 2017-12-09: 2 g via INTRAVENOUS
  Filled 2017-12-09: qty 2

## 2017-12-09 MED ORDER — SENNOSIDES-DOCUSATE SODIUM 8.6-50 MG PO TABS
2.0000 | ORAL_TABLET | ORAL | Status: DC
  Administered 2017-12-09 – 2017-12-11 (×2): 2 via ORAL
  Filled 2017-12-09 (×2): qty 2

## 2017-12-09 MED ORDER — LIDOCAINE HCL (PF) 1 % IJ SOLN
30.0000 mL | INTRAMUSCULAR | Status: DC | PRN
  Filled 2017-12-09: qty 30

## 2017-12-09 NOTE — MAU Note (Signed)
Pt reports ROM at 1810 and contractions

## 2017-12-09 NOTE — Anesthesia Procedure Notes (Signed)
Epidural Patient location during procedure: OB Start time: 12/09/2017 7:19 PM End time: 12/09/2017 7:29 PM  Staffing Anesthesiologist: Achille Rich, MD Performed: anesthesiologist   Preanesthetic Checklist Completed: patient identified, site marked, pre-op evaluation, timeout performed, IV checked, risks and benefits discussed and monitors and equipment checked  Epidural Patient position: sitting Prep: DuraPrep Patient monitoring: heart rate, cardiac monitor, continuous pulse ox and blood pressure Approach: midline Location: L2-L3 Injection technique: LOR saline  Needle:  Needle type: Tuohy  Needle gauge: 17 G Needle length: 9 cm Needle insertion depth: 5 cm Catheter type: closed end flexible Catheter size: 19 Gauge Catheter at skin depth: 12 cm Test dose: negative and Other  Assessment Events: blood not aspirated, injection not painful, no injection resistance and negative IV test  Additional Notes Informed consent obtained prior to proceeding including risk of failure, 1% risk of PDPH, risk of minor discomfort and bruising.  Discussed rare but serious complications including epidural abscess, permanent nerve injury, epidural hematoma.  Discussed alternatives to epidural analgesia and patient desires to proceed.  Timeout performed pre-procedure verifying patient name, procedure, and platelet count.  Patient tolerated procedure well. Reason for block:procedure for pain
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
## 2017-12-09 NOTE — Anesthesia Preprocedure Evaluation (Signed)
Anesthesia Evaluation  Patient identified by MRN, date of birth, ID band Patient awake    Reviewed: Allergy & Precautions, H&P , NPO status , Patient's Chart, lab work & pertinent test results  Airway Mallampati: II   Neck ROM: full    Dental   Pulmonary asthma ,    breath sounds clear to auscultation       Cardiovascular negative cardio ROS   Rhythm:regular Rate:Normal     Neuro/Psych  Headaches,    GI/Hepatic   Endo/Other    Renal/GU      Musculoskeletal   Abdominal   Peds  Hematology   Anesthesia Other Findings   Reproductive/Obstetrics (+) Pregnancy                             Anesthesia Physical Anesthesia Plan  ASA: II  Anesthesia Plan: Epidural   Post-op Pain Management:    Induction: Intravenous  PONV Risk Score and Plan: 2 and Treatment may vary due to age or medical condition  Airway Management Planned: Natural Airway  Additional Equipment:   Intra-op Plan:   Post-operative Plan:   Informed Consent: I have reviewed the patients History and Physical, chart, labs and discussed the procedure including the risks, benefits and alternatives for the proposed anesthesia with the patient or authorized representative who has indicated his/her understanding and acceptance.       Plan Discussed with: Anesthesiologist  Anesthesia Plan Comments:         Anesthesia Quick Evaluation  

## 2017-12-10 ENCOUNTER — Encounter (HOSPITAL_COMMUNITY): Payer: Self-pay | Admitting: *Deleted

## 2017-12-10 LAB — CBC
HCT: 32.3 % — ABNORMAL LOW (ref 36.0–46.0)
Hemoglobin: 10.2 g/dL — ABNORMAL LOW (ref 12.0–15.0)
MCH: 25.5 pg — ABNORMAL LOW (ref 26.0–34.0)
MCHC: 31.6 g/dL (ref 30.0–36.0)
MCV: 80.8 fL (ref 78.0–100.0)
Platelets: 278 10*3/uL (ref 150–400)
RBC: 4 MIL/uL (ref 3.87–5.11)
RDW: 15.6 % — ABNORMAL HIGH (ref 11.5–15.5)
WBC: 16.5 10*3/uL — ABNORMAL HIGH (ref 4.0–10.5)

## 2017-12-10 LAB — RPR: RPR Ser Ql: NONREACTIVE

## 2017-12-10 MED ORDER — LORATADINE 10 MG PO TABS
10.0000 mg | ORAL_TABLET | Freq: Every day | ORAL | Status: DC | PRN
  Administered 2017-12-10: 10 mg via ORAL
  Filled 2017-12-10: qty 1

## 2017-12-10 NOTE — Progress Notes (Signed)
Post Partum Day 1  Subjective: no complaints, up ad lib, voiding and tolerating PO. Desires outpatient circ at [facility].    Objective: Vitals:   12/09/17 2200 12/09/17 2235 12/09/17 2345 12/10/17 0441  BP: 122/61 124/66 106/78 127/71  Pulse: 88 86 (!) 102 94  Resp: Temp: 98.5 F (36.9 C) 98.4 F (36.9 C) 98.7 F (37.1 C) 98.4 F (36.9 C)  TempSrc: Oral Oral Oral Oral  SpO2:      Weight:      Height:        Physical Exam:  General: alert and cooperative Lochia: appropriate Uterine Fundus: firm Incision: n/a DVT Evaluation: No evidence of DVT seen on physical exam. Negative Homan's sign. No cords or calf tenderness. No significant calf/ankle edema.  Results for orders placed or performed during the hospital encounter of 12/09/17 (from the past 24 hour(s))  Comprehensive metabolic panel     Status: Abnormal   Collection Time: 12/09/17  6:49 PM  Result Value Ref Range   Sodium 135 135 - 145 mmol/L   Potassium 3.6 3.5 - 5.1 mmol/L   Chloride 104 101 - 111 mmol/L   CO2 19 (L) 22 - 32 mmol/L   Glucose, Bld 88 65 - 99 mg/dL   BUN 6 6 - 20 mg/dL   Creatinine, Ser 7.82 (L) 0.44 - 1.00 mg/dL   Calcium 9.0 8.9 - 95.6 mg/dL   Total Protein 6.4 (L) 6.5 - 8.1 g/dL   Albumin 2.9 (L) 3.5 - 5.0 g/dL   AST 23 15 - 41 U/L   ALT 20 14 - 54 U/L   Alkaline Phosphatase 164 (H) 38 - 126 U/L   Total Bilirubin 0.2 (L) 0.3 - 1.2 mg/dL   GFR calc non Af Amer >60 >60 mL/min   GFR calc Af Amer >60 >60 mL/min   Anion gap 12 5 - 15  CBC     Status: Abnormal   Collection Time: 12/09/17  6:49 PM  Result Value Ref Range   WBC 11.5 (H) 4.0 - 10.5 K/uL   RBC 4.18 3.87 - 5.11 MIL/uL   Hemoglobin 10.8 (L) 12.0 - 15.0 g/dL   HCT 21.3 (L) 08.6 - 57.8 %   MCV 80.6 78.0 - 100.0 fL   MCH 25.8 (L) 26.0 - 34.0 pg   MCHC 32.0 30.0 - 36.0 g/dL   RDW 46.9 (H) 62.9 - 52.8 %   Platelets 291 150 - 400 K/uL  RPR     Status: None   Collection Time: 12/09/17  6:49 PM  Result Value Ref Range   RPR Ser Ql Non Reactive Non Reactive  Type and screen [facility]     Status: None   Collection Time: 12/09/17  6:49 PM  Result Value Ref Range   ABO/RH(D) O POS    Antibody Screen NEG    Sample Expiration      12/12/2017 Performed at [facility], [address], [place], [place] [address]   Protein / creatinine ratio, urine     Status: None   Collection Time: 12/09/17  9:45 PM  Result Value Ref Range   Creatinine, Urine 53.00 mg/dL   Total Protein, Urine 8 mg/dL   Protein Creatinine Ratio 0.15 0.00 - 0.15 mg/mg[Cre]  CBC     Status: Abnormal   Collection Time: 12/10/17  5:21 AM  Result Value Ref Range   WBC 16.5 (H) 4.0 - 10.5 K/uL   RBC 4.00 3.87 - 5.11  MIL/uL   Hemoglobin 10.2 (L) 12.0 - 15.0 g/dL   HCT 16.1 (L) 09.6 - 04.5 %   MCV 80.8 78.0 - 100.0 fL   MCH 25.5 (L) 26.0 - 34.0 pg   MCHC 31.6 30.0 - 36.0 g/dL   RDW 40.9 (H) 81.1 - 91.4 %   Platelets 278 150 - 400 K/uL    Assessment/Plan: Plan for discharge tomorrow, Breastfeeding and Contraception Mini-Pill  Continue current plan of care GBS+, only received 1 dose of ampicillin, anticipate baby remaining in hospital for 48 hrs   LOS: 1 day   Janeece Riggers 12/10/2017, 8:08 AM
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
## 2017-12-10 NOTE — Lactation Note (Signed)
This note was copied from a baby's chart. Lactation Consultation Note  Patient Name: Kristin Lyons [id number] Date: 12/10/2017 Reason for consult: Initial assessment;1st time breastfeeding;Primapara;Early term 37-38.6wks   P1 mother whose infant is now 80 hours old.  This is an ETI who weighs 6=0.3 oz.  Mother holding infant in arms as I entered.  When questioned about her feeding plan, mother stated she is planning on breastfeeding.  However, she has already bottle fed 3 times since birth and has also asked me about pumping and putting milk in a bottle.  She told me that she bottle fed her baby because he "looked hungry."    Educated mother on the importance of putting infant to breast first before bottle feeding and limiting formula to 5-10 mls per feeding if she plans to do breast/bottle.  Discussed feeding cues and to breastfeed 8-12 times/24 hours or earlier if he shows cues.  Encouraged mother to call for latch assistance if needed the next time baby cues, although, that may be a few hours since he just drank 33 mls of formula.  Mother asked if she needed to pump with a DEBP now and I told her that she needs to just latch baby for now.  No pump is necessary at this point.  She also questioned me about pumping in case she wants to pump and bottle feed.  I told her that I would support any decision she made, however, she may want to consider practicing the breastfeeding while she is here in the hospital.  Mother verbalized understanding.    She does have WIC and will be considering a WIC rental at discharge .Mom made aware of O/P services, breastfeeding support groups, community resources, and our phone # for post-discharge questions.  FOB present.   Maternal Data Formula Feeding for Exclusion: No Has patient been taught Hand Expression?: No Does the patient have breastfeeding experience prior to this delivery?: No  Feeding Feeding Type: Bottle Fed - Formula  LATCH Score                    Interventions    Lactation Tools Discussed/Used WIC Program: Yes   Consult Status Consult Status: Follow-up Date: 12/11/17 Follow-up type: In-patient    Dora Sims 12/10/2017, 6:35 AM

## 2017-12-10 NOTE — Lactation Note (Signed)
This note was copied from a baby's chart. Lactation Consultation Note  Patient Name: Kristin Lyons [id number] Date: 12/10/2017    [facility] Initial Visit:  Mother and baby sleeping; will try to return later for visit.             Kristin Lyons [name] 12/10/2017, 3:43 AM

## 2017-12-10 NOTE — Anesthesia Postprocedure Evaluation (Signed)
Anesthesia Post Note  Patient: Kristin Lyons  Procedure(s) Performed: AN AD HOC LABOR EPIDURAL     Patient location during evaluation: Mother Baby Anesthesia Type: Epidural Level of consciousness: awake and alert Pain management: pain level controlled Vital Signs Assessment: post-procedure vital signs reviewed and stable Respiratory status: spontaneous breathing, nonlabored ventilation and respiratory function stable Cardiovascular status: stable Postop Assessment: no headache, no backache, epidural receding, no apparent nausea or vomiting, able to ambulate, patient able to bend at knees and adequate PO intake Anesthetic complications: no    Last Vitals:  Vitals:   12/09/17 2345 12/10/17 0441  BP: 106/78 127/71  Pulse: (!) 102 94  Resp: 18 16  Temp: 37.1 C 36.9 C  SpO2:      Last Pain:  Vitals:   12/10/17 0441  TempSrc: Oral  PainSc:    Pain Goal:                 Laban Emperor

## 2017-12-11 MED ORDER — MEDROXYPROGESTERONE ACETATE 150 MG/ML IM SUSP
150.0000 mg | Freq: Once | INTRAMUSCULAR | Status: AC
  Administered 2017-12-11: 150 mg via INTRAMUSCULAR
  Filled 2017-12-11: qty 1

## 2017-12-11 MED ORDER — IBUPROFEN 600 MG PO TABS: 600.0000 mg | ORAL_TABLET | Freq: Four times a day (QID) | ORAL | 0 refills | Status: DC

## 2017-12-11 MED ORDER — MEDROXYPROGESTERONE ACETATE 150 MG/ML IM SUSP: 150.0000 mg | INTRAMUSCULAR | 0 refills | Status: DC

## 2017-12-11 NOTE — Discharge Instructions (Signed)
Vaginal Delivery Vaginal delivery means that you will give birth by pushing your baby out of your birth canal (vagina). A team of health care providers will help you before, during, and after vaginal delivery. Birth experiences are unique for every woman and every pregnancy, and birth experiences vary depending on where you choose to give birth. What should I do to prepare for my baby's birth? Before your baby is born, it is important to talk with your health care provider about:  Your labor and delivery preferences. These may include: ? Medicines that you may be given. ? How you will manage your pain. This might include non-medical pain relief techniques or injectable pain relief such as epidural analgesia. ? How you and your baby will be monitored during labor and delivery. ? Who may be in the labor and delivery room with you. ? Your feelings about surgical delivery of your baby (cesarean delivery, or C-section) if this becomes necessary. ? Your feelings about receiving donated blood through an IV tube (blood transfusion) if this becomes necessary.  Whether you are able: ? To take pictures or videos of the birth. ? To eat during labor and delivery. ? To move around, walk, or change positions during labor and delivery.  What to expect after your baby is born, such as: ? Whether delayed umbilical cord clamping and cutting is offered. ? Who will care for your baby right after birth. ? Medicines or tests that may be recommended for your baby. ? Whether breastfeeding is supported in your hospital or birth center. ? How long you will be in the hospital or birth center.  How any medical conditions you have may affect your baby or your labor and delivery experience.  To prepare for your baby's birth, you should also:  Attend all of your health care visits before delivery (prenatal visits) as recommended by your health care provider. This is important.  Prepare your home for your baby's  arrival. Make sure that you have: ? Diapers. ? Baby clothing. ? Feeding equipment. ? Safe sleeping arrangements for you and your baby.  Install a car seat in your vehicle. Have your car seat checked by a certified car seat installer to make sure that it is installed safely.  Think about who will help you with your new baby at home for at least the first several weeks after delivery.  What can I expect when I arrive at the birth center or hospital? Once you are in labor and have been admitted into the hospital or birth center, your health care provider may:  Review your pregnancy history and any concerns you have.  Insert an IV tube into one of your veins. This is used to give you fluids and medicines.  Check your blood pressure, pulse, temperature, and heart rate (vital signs).  Check whether your bag of water (amniotic sac) has broken (ruptured).  Talk with you about your birth plan and discuss pain control options.  Monitoring Your health care provider may monitor your contractions (uterine monitoring) and your baby's heart rate (fetal monitoring). You may need to be monitored:  Often, but not continuously (intermittently).  All the time or for long periods at a time (continuously). Continuous monitoring may be needed if: ? You are taking certain medicines, such as medicine to relieve pain or make your contractions stronger. ? You have pregnancy or labor complications.  Monitoring may be done by:  Placing a special stethoscope or a handheld monitoring device on your abdomen  to check your baby's heartbeat, and feeling your abdomen for contractions. This method of monitoring does not continuously record your baby's heartbeat or your contractions.  Placing monitors on your abdomen (external monitors) to record your baby's heartbeat and the frequency and length of contractions. You may not have to wear external monitors all the time.  Placing monitors inside of your uterus  (internal monitors) to record your baby's heartbeat and the frequency, length, and strength of your contractions. ? Your health care provider may use internal monitors if he or she needs more information about the strength of your contractions or your baby's heart rate. ? Internal monitors are put in place by passing a thin, flexible wire through your vagina and into your uterus. Depending on the type of monitor, it may remain in your uterus or on your baby's head until birth. ? Your health care provider will discuss the benefits and risks of internal monitoring with you and will ask for your permission before inserting the monitors.  Telemetry. This is a type of continuous monitoring that can be done with external or internal monitors. Instead of having to stay in bed, you are able to move around during telemetry. Ask your health care provider if telemetry is an option for you.  Physical exam Your health care provider may perform a physical exam. This may include:  Checking whether your baby is positioned: ? With the head toward your vagina (head-down). This is most common. ? With the head toward the top of your uterus (head-up or breech). If your baby is in a breech position, your health care provider may try to turn your baby to a head-down position so you can deliver vaginally. If it does not seem that your baby can be born vaginally, your provider may recommend surgery to deliver your baby. In rare cases, you may be able to deliver vaginally if your baby is head-up (breech delivery). ? Lying sideways (transverse). Babies that are lying sideways cannot be delivered vaginally.  Checking your cervix to determine: ? Whether it is thinning out (effacing). ? Whether it is opening up (dilating). ? How low your baby has moved into your birth canal.  What are the three stages of labor and delivery?  Normal labor and delivery is divided into the following three stages: Stage 1  Stage 1 is the  longest stage of labor, and it can last for hours or days. Stage 1 includes: ? Early labor. This is when contractions may be irregular, or regular and mild. Generally, early labor contractions are more than 10 minutes apart. ? Active labor. This is when contractions get longer, more regular, more frequent, and more intense. ? The transition phase. This is when contractions happen very close together, are very intense, and may last longer than during any other part of labor.  Contractions generally feel mild, infrequent, and irregular at first. They get stronger, more frequent (about every 2-3 minutes), and more regular as you progress from early labor through active labor and transition.  Many women progress through stage 1 naturally, but you may need help to continue making progress. If this happens, your health care provider may talk with you about: ? Rupturing your amniotic sac if it has not ruptured yet. ? Giving you medicine to help make your contractions stronger and more frequent.  Stage 1 ends when your cervix is completely dilated to 4 inches (10 cm) and completely effaced. This happens at the end of the transition phase. Stage 2  Once your cervix is completely effaced and dilated to 4 inches (10 cm), you may start to feel an urge to push. It is common for the body to naturally take a rest before feeling the urge to push, especially if you received an epidural or certain other pain medicines. This rest period may last for up to 1-2 hours, depending on your unique labor experience.  During stage 2, contractions are generally less painful, because pushing helps relieve contraction pain. Instead of contraction pain, you may feel stretching and burning pain, especially when the widest part of your baby's head passes through the vaginal opening (crowning).  Your health care provider will closely monitor your pushing progress and your baby's progress through the vagina during stage 2.  Your  health care provider may massage the area of skin between your vaginal opening and anus (perineum) or apply warm compresses to your perineum. This helps it stretch as the baby's head starts to crown, which can help prevent perineal tearing. ? In some cases, an incision may be made in your perineum (episiotomy) to allow the baby to pass through the vaginal opening. An episiotomy helps to make the opening of the vagina larger to allow more room for the baby to fit through.  It is very important to breathe and focus so your health care provider can control the delivery of your baby's head. Your health care provider may have you decrease the intensity of your pushing, to help prevent perineal tearing.  After delivery of your baby's head, the shoulders and the rest of the body generally deliver very quickly and without difficulty.  Once your baby is delivered, the umbilical cord may be cut right away, or this may be delayed for 1-2 minutes, depending on your baby's health. This may vary among health care providers, hospitals, and birth centers.  If you and your baby are healthy enough, your baby may be placed on your chest or abdomen to help maintain the baby's temperature and to help you bond with each other. Some mothers and babies start breastfeeding at this time. Your health care team will dry your baby and help keep your baby warm during this time.  Your baby may need immediate care if he or she: ? Showed signs of distress during labor. ? Has a medical condition. ? Was born too early (prematurely). ? Had a bowel movement before birth (meconium). ? Shows signs of difficulty transitioning from being inside the uterus to being outside of the uterus. If you are planning to breastfeed, your health care team will help you begin a feeding. Stage 3  The third stage of labor starts immediately after the birth of your baby and ends after you deliver the placenta. The placenta is an organ that develops  during pregnancy to provide oxygen and nutrients to your baby in the womb.  Delivering the placenta may require some pushing, and you may have mild contractions. Breastfeeding can stimulate contractions to help you deliver the placenta.  After the placenta is delivered, your uterus should tighten (contract) and become firm. This helps to stop bleeding in your uterus. To help your uterus contract and to control bleeding, your health care provider may: ? Give you medicine by injection, through an IV tube, by mouth, or through your rectum (rectally). ? Massage your abdomen or perform a vaginal exam to remove any blood clots that are left in your uterus. ? Empty your bladder by placing a thin, flexible tube (catheter) into your bladder. ?  Encourage you to breastfeed your baby. After labor is over, you and your baby will be monitored closely to ensure that you are both healthy until you are ready to go home. Your health care team will teach you how to care for yourself and your baby. This information is not intended to replace advice given to you by your health care provider. Make sure you discuss any questions you have with your health care provider. Document Released: 04/24/2008 Document Revised: 02/03/2016 Document Reviewed: 07/31/2015 Elsevier Interactive Patient Education  2018 [employer] Postpartum Care After Vaginal Delivery The period of time right after you deliver your newborn is called the postpartum period. What kind of medical care will I receive?  You may continue to receive fluids and medicines through an IV tube inserted into one of your veins.  If an incision was made near your vagina (episiotomy) or if you had some vaginal tearing during delivery, cold compresses may be placed on your episiotomy or your tear. This helps to reduce pain and swelling.  You may be given a squirt bottle to use when you go to the bathroom. You may use this until you are comfortable wiping as usual. To  use the squirt bottle, follow these steps: ? Before you urinate, fill the squirt bottle with warm water. Do not use hot water. ? After you urinate, while you are sitting on the toilet, use the squirt bottle to rinse the area around your urethra and vaginal opening. This rinses away any urine and blood. ? You may do this instead of wiping. As you start healing, you may use the squirt bottle before wiping yourself. Make sure to wipe gently. ? Fill the squirt bottle with clean water every time you use the bathroom.  You will be given sanitary pads to wear. How can I expect to feel?  You may not feel the need to urinate for several hours after delivery.  You will have some soreness and pain in your abdomen and vagina.  If you are breastfeeding, you may have uterine contractions every time you breastfeed for up to several weeks postpartum. Uterine contractions help your uterus return to its normal size.  It is normal to have vaginal bleeding (lochia) after delivery. The amount and appearance of lochia is often similar to a menstrual period in the first week after delivery. It will gradually decrease over the next few weeks to a dry, yellow-brown discharge. For most women, lochia stops completely by 6-8 weeks after delivery. Vaginal bleeding can vary from woman to woman.  Within the first few days after delivery, you may have breast engorgement. This is when your breasts feel heavy, full, and uncomfortable. Your breasts may also throb and feel hard, tightly stretched, warm, and tender. After this occurs, you may have milk leaking from your breasts.Your health care provider can help you relieve discomfort due to breast engorgement. Breast engorgement should go away within a few days.  You may feel more sad or worried than normal due to hormonal changes after delivery. These feelings should not last more than a few days. If these feelings do not go away after several days, speak with your health care  provider. How should I care for myself?  Tell your health care provider if you have pain or discomfort.  Drink enough water to keep your urine clear or pale yellow.  Wash your hands thoroughly with soap and water for at least 20 seconds after changing your sanitary pads, after using the toilet,  and before holding or feeding your baby.  If you are not breastfeeding, avoid touching your breasts a lot. Doing this can make your breasts produce more milk.  If you become weak or lightheaded, or you feel like you might faint, ask for help before: ? Getting out of bed. ? Showering.  Change your sanitary pads frequently. Watch for any changes in your flow, such as a sudden increase in volume, a change in color, the passing of large blood clots. If you pass a blood clot from your vagina, save it to show to your health care provider. Do not flush blood clots down the toilet without having your health care provider look at them.  Make sure that all your vaccinations are up to date. This can help protect you and your baby from getting certain diseases. You may need to have immunizations done before you leave the hospital.  If desired, talk with your health care provider about methods of family planning or birth control (contraception). How can I start bonding with my baby? Spending as much time as possible with your baby is very important. During this time, you and your baby can get to know each other and develop a bond. Having your baby stay with you in your room (rooming in) can give you time to get to know your baby. Rooming in can also help you become comfortable caring for your baby. Breastfeeding can also help you bond with your baby. How can I plan for returning home with my baby?  Make sure that you have a car seat installed in your vehicle. ? Your car seat should be checked by a certified car seat installer to make sure that it is installed safely. ? Make sure that your baby fits into the car  seat safely.  Ask your health care provider any questions you have about caring for yourself or your baby. Make sure that you are able to contact your health care provider with any questions after leaving the hospital. This information is not intended to replace advice given to you by your health care provider. Make sure you discuss any questions you have with your health care provider. Document Released: 05/13/2007 Document Revised: 12/19/2015 Document Reviewed: 06/20/2015 Elsevier Interactive Patient Education  2018 [employer].   Postpartum Depression and Baby Blues The postpartum period begins right after the birth of a baby. During this time, there is often a great amount of joy and excitement. It is also a time of many changes in the life of the parents. Regardless of how many times a mother gives birth, each child brings new challenges and dynamics to the family. It is not unusual to have feelings of excitement along with confusing shifts in moods, emotions, and thoughts. All mothers are at risk of developing postpartum depression or the "baby blues." These mood changes can occur right after giving birth, or they may occur many months after giving birth. The baby blues or postpartum depression can be mild or severe. Additionally, postpartum depression can go away rather quickly, or it can be a long-term condition. What are the causes? Raised hormone levels and the rapid drop in those levels are thought to be a main cause of postpartum depression and the baby blues. A number of hormones change during and after pregnancy. Estrogen and progesterone usually decrease right after the delivery of your baby. The levels of thyroid hormone and various cortisol steroids also rapidly drop. Other factors that play a role in these mood changes include  major life events and genetics. What increases the risk? If you have any of the following risks for the baby blues or postpartum depression, know what symptoms  to watch out for during the postpartum period. Risk factors that may increase the likelihood of getting the baby blues or postpartum depression include:  Having a personal or family history of depression.  Having depression while being pregnant.  Having premenstrual mood issues or mood issues related to oral contraceptives.  Having a lot of life stress.  Having marital conflict.  Lacking a social support network.  Having a baby with special needs.  Having health problems, such as diabetes.  What are the signs or symptoms? Symptoms of baby blues include:  Brief changes in mood, such as going from extreme happiness to sadness.  Decreased concentration.  Difficulty sleeping.  Crying spells, tearfulness.  Irritability.  Anxiety.  Symptoms of postpartum depression typically begin within the first month after giving birth. These symptoms include:  Difficulty sleeping or excessive sleepiness.  Marked weight loss.  Agitation.  Feelings of worthlessness.  Lack of interest in activity or food.  Postpartum psychosis is a very serious condition and can be dangerous. Fortunately, it is rare. Displaying any of the following symptoms is cause for immediate medical attention. Symptoms of postpartum psychosis include:  Hallucinations and delusions.  Bizarre or disorganized behavior.  Confusion or disorientation.  How is this diagnosed? A diagnosis is made by an evaluation of your symptoms. There are no medical or lab tests that lead to a diagnosis, but there are various questionnaires that a health care provider may use to identify those with the baby blues, postpartum depression, or psychosis. Often, a screening tool called the [place] Postnatal Depression Scale is used to diagnose depression in the postpartum period. How is this treated? The baby blues usually goes away on its own in 1-2 weeks. Social support is often all that is needed. You will be encouraged to get  adequate sleep and rest. Occasionally, you may be given medicines to help you sleep. Postpartum depression requires treatment because it can last several months or longer if it is not treated. Treatment may include individual or group therapy, medicine, or both to address any social, physiological, and psychological factors that may play a role in the depression. Regular exercise, a healthy diet, rest, and social support may also be strongly recommended. Postpartum psychosis is more serious and needs treatment right away. Hospitalization is often needed. Follow these instructions at home:  Get as much rest as you can. Nap when the baby sleeps.  Exercise regularly. Some women find yoga and walking to be beneficial.  Eat a balanced and nourishing diet.  Do little things that you enjoy. Have a cup of tea, take a bubble bath, read your favorite magazine, or listen to your favorite music.  Avoid alcohol.  Ask for help with household chores, cooking, grocery shopping, or running errands as needed. Do not try to do everything.  Talk to people close to you about how you are feeling. Get support from your partner, family members, friends, or other new moms.  Try to stay positive in how you think. Think about the things you are grateful for.  Do not spend a lot of time alone.  Only take over-the-counter or prescription medicine as directed by your health care provider.  Keep all your postpartum appointments.  Let your health care provider know if you have any concerns. Contact a health care provider if: You are having a  reaction to or problems with your medicine. Get help right away if:  You have suicidal feelings.  You think you may harm the baby or someone else. This information is not intended to replace advice given to you by your health care provider. Make sure you discuss any questions you have with your health care provider. Document Released: 04/19/2004 Document Revised: 12/22/2015  Document Reviewed: 04/27/2013 Elsevier Interactive Patient Education  2017 [employer].

## 2017-12-11 NOTE — Lactation Note (Addendum)
This note was copied from a baby's chart. Lactation Consultation Note  Patient Name: Kristin Lyons [id number] Date: 12/11/2017 Reason for consult: Follow-up assessment;Infant weight loss;Term;Infant < 6lbs  Baby is 71 hours old  LC updated the doc flow per mom. / Baby recently was fed a bottle.  Per mom had pumped x for 5 mins and the phone rang. LC reviewed  Set up with pump and instructed mom to start over the 15 - 20 mins both breast.  LC reviewed supply and demand, and the importance of consistent stimulating  Her breast at least 8 times a day for 15 -20 mins.  Reviewed hand expressing, small drops noted. Mom returned demo.  LC encouraged hand expressing before and after pumping.  Also to consider allowing her baby to get hungry and try latching.  Sore nipple and engorgement prevention and tx. Reviewed.  Per mom has  DEBP ( Medela ) at home.  Mother informed of post-discharge support and given phone number to the lactation department, including services for phone call assistance; out-patient appointments; and breastfeeding support group. List of other breastfeeding resources in the community given in the handout. Encouraged mother to call for problems or concerns related to breastfeeding.     Maternal Data Has patient been taught Hand Expression?: Yes(small drops )  Feeding Feeding Type: Formula Nipple Type: Slow - flow  LATCH Score Latch: (instructed MOB to call for next latch)                 Interventions Interventions: Breast feeding basics reviewed  Lactation Tools Discussed/Used Tools: Pump Breast pump type: Double-Electric Breast Pump Pump Review: Setup, frequency, and cleaning;Milk Storage Initiated by:: RN set up  Date initiated:: 12/11/17   Consult Status Consult Status: Complete Date: 12/11/17    Kathrin Greathouse 12/11/2017, 10:47 AM

## 2017-12-11 NOTE — Plan of Care (Signed)
Pt reported at shift change that she wanted to bottle feed only.  With later discussion, she reported that she wanted to breastfeed like she did with her last child but reports that her breasts don't feel like her milk is coming in so she just wanted to give bottles until the milk came in.  Supply and demand reviewed and discussed importance of pumping if she was not going to place the baby to the breast.  Demonstrated hand expression and was able to easily express colostrum.  MOB's breasts, on assessment, feel as if they are becoming full.  MOB encouraged by the colostrum that was expressed and was willing to pump with a DEBP.  MOB reports she has a medela pump at home.  Pump given and set up, cleaning and frequency reviewed.   Instructed MOB to call for next latch.  MOB verbalized understanding of all teaching.  Problem: Education: Goal: Knowledge of condition will improve Outcome: Progressing

## 2017-12-11 NOTE — Discharge Summary (Addendum)
OB Discharge Summary     Patient Name: Kristin Lyons DOB: 10-06-1990 MRN: [id number]  Date of admission: 12/09/2017 Delivering MD: Illene Bolus A   Date of discharge: 12/11/2017  Admitting diagnosis: CTX Intrauterine pregnancy: [redacted]w[redacted]d     Secondary diagnosis:  Principal Problem:   SVD (spontaneous vaginal delivery) Active Problems:   Vaginal delivery  Additional problems: none     Discharge diagnosis: Term Pregnancy Delivered                                                                                                Post partum procedures:none  Augmentation: none, pt SROM  Complications: None  Hospital course:  Onset of Labor With Vaginal Delivery     27 y.o. yo [id number] at [redacted]w[redacted]d was admitted in Active Labor on 12/09/2017. Patient had an uncomplicated labor course as follows:  Membrane Rupture Time/Date: 6:10 PM ,12/09/2017   Intrapartum Procedures: Episiotomy: None [1]                                         Lacerations:  1st degree [2];Labial [10];Vaginal [6]  Patient had a delivery of a Viable infant. 12/09/2017  Information for the patient's newborn:  Lamyra, Malcolm [[id number]]  Delivery Method: Vaginal, Spontaneous(Filed from Delivery Summary)    Pateint had an uncomplicated postpartum course.  She is ambulating, tolerating a regular diet, passing flatus, and urinating well. Patient is discharged home in stable condition on 12/11/17.   Physical exam  Vitals:   12/09/17 2235 12/09/17 2345 12/10/17 0441 12/10/17 1242  BP: 124/66 106/78 127/71 121/77  Pulse: 86 (!) 102 94 86  Resp: Temp: 98.4 F (36.9 C) 98.7 F (37.1 C) 98.4 F (36.9 C) 98.3 F (36.8 C)  TempSrc: Oral Oral Oral Oral  SpO2:      Weight:      Height:       General: alert, cooperative and no distress Lochia: appropriate Uterine Fundus: firm Vaginal laceration: No significant erythema DVT Evaluation: No evidence of DVT seen on physical exam. Negative Homan's  sign. No cords or calf tenderness. No significant calf/ankle edema. Labs: Lab Results  Component Value Date   WBC 16.5 (H) 12/10/2017   HGB 10.2 (L) 12/10/2017   HCT 32.3 (L) 12/10/2017   MCV 80.8 12/10/2017   PLT 278 12/10/2017   CMP Latest Ref Rng & Units 12/09/2017  Glucose 65 - 99 mg/dL 88  BUN 6 - 20 mg/dL 6  Creatinine 1.30 - 8.65 mg/dL 7.84(O)  Sodium 962 - 952 mmol/L 135  Potassium 3.5 - 5.1 mmol/L 3.6  Chloride 101 - 111 mmol/L 104  CO2 22 - 32 mmol/L 19(L)  Calcium 8.9 - 10.3 mg/dL 9.0  Total Protein 6.5 - 8.1 g/dL 6.4(L)  Total Bilirubin 0.3 - 1.2 mg/dL 8.4(X)  Alkaline Phos 38 - 126 U/L 164(H)  AST 15 - 41 U/L 23  ALT 14 - 54 U/L 20    Discharge instruction: per After  Visit Summary and "Baby and Me Booklet".  After visit meds: PNV  Diet: routine diet  Activity: Advance as tolerated. Pelvic rest for 6 weeks.   Outpatient follow up:6 weeks Follow up Appt:No future appointments. Follow up Visit:No follow-ups on file.  Postpartum contraception: Depo Provera , pt have call BCBS and make sure depo in pt is covered before administering at future appts.  1st dose give prior to d/c.  Newborn Data: Live born female  Birth Weight: 6 lb 0.5 oz (2736 g) APGAR: 8, 9  Newborn Delivery   Birth date/time:  12/09/2017 20:24:00 Delivery type:  Vaginal, Spontaneous     Baby Feeding: Bottle and Breast Disposition:home with mother Pt discharged in stable condition   12/11/2017 Dale Shipman, FNP
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
## 2017-12-16 ENCOUNTER — Inpatient Hospital Stay (HOSPITAL_COMMUNITY): Payer: BLUE CROSS/BLUE SHIELD

## 2018-02-08 ENCOUNTER — Encounter (HOSPITAL_COMMUNITY): Payer: Self-pay | Admitting: Emergency Medicine

## 2018-02-08 ENCOUNTER — Ambulatory Visit (HOSPITAL_COMMUNITY)
Admission: EM | Admit: 2018-02-08 | Discharge: 2018-02-08 | Disposition: A | Discharge status: Home / Self Care | Payer: BLUE CROSS/BLUE SHIELD | Attending: Internal Medicine | Admitting: Internal Medicine

## 2018-02-08 DIAGNOSIS — R51 Headache: Secondary | ICD-10-CM

## 2018-02-08 DIAGNOSIS — H02816 Retained foreign body in left eye, unspecified eyelid: Secondary | ICD-10-CM

## 2018-02-08 DIAGNOSIS — H5789 Other specified disorders of eye and adnexa: Secondary | ICD-10-CM

## 2018-02-08 DIAGNOSIS — H5712 Ocular pain, left eye: Secondary | ICD-10-CM | POA: Diagnosis not present

## 2018-02-08 MED ORDER — TETRACAINE HCL 0.5 % OP SOLN
OPHTHALMIC | Status: AC
  Filled 2018-02-08: qty 4

## 2018-02-08 MED ORDER — KETOROLAC TROMETHAMINE 30 MG/ML IJ SOLN
INTRAMUSCULAR | Status: AC
  Filled 2018-02-08: qty 1

## 2018-02-08 MED ORDER — KETOROLAC TROMETHAMINE 30 MG/ML IJ SOLN
30.0000 mg | Freq: Once | INTRAMUSCULAR | Status: AC
  Administered 2018-02-08: 30 mg via INTRAMUSCULAR

## 2018-02-08 MED ORDER — ERYTHROMYCIN 5 MG/GM OP OINT: TOPICAL_OINTMENT | OPHTHALMIC | 0 refills | Status: DC
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
## 2018-02-08 NOTE — ED Triage Notes (Signed)
Pt states she went swimming and thinks she got something in her eye, L eye is red, draining.

## 2019-04-29 ENCOUNTER — Other Ambulatory Visit: Payer: Self-pay

## 2019-04-29 DIAGNOSIS — Z20822 Contact with and (suspected) exposure to covid-19: Secondary | ICD-10-CM

## 2019-05-01 LAB — NOVEL CORONAVIRUS, NAA: SARS-CoV-2, NAA: NOT DETECTED

## 2019-07-31 NOTE — L&D Delivery Note (Signed)
Delivery Note Labor onset:   07/19/20 Labor Onset Time: 0025 Complete dilation at 1:20 AM  Onset of pushing at 0140 FHR second stage Cat 1 Analgesia/Anesthesia intrapartum: Epidural  Guided pushing with strong maternal urge. Delivery of a viable female at 57. Fetal head delivered in ROA position. Nuchal cord none. Infant placed on maternal abd, dried, and tactile stim.  Cord double clamped after 3 min and cut by [facility], FOB. Markee present for birth. Cord blood sample collected yes Arterial cord blood sample N/a.  Placenta delivered Duncan, intact, with 3 VC.  Placenta to L&D. Uterine tone firm, bleeding scant  No laceration identified.  Anesthesia: Epidural Repair: N/A EBL (mL): 200 Complications: None APGAR: APGAR (1 MIN): 8 APGAR (5 MINS):  9 APGAR (10 MINS):   Mom to postpartum.  Baby to Couplet care / Skin to Skin.  Roma Schanz MSN, CNM 07/19/2020, 2:11 AM

## 2019-11-19 ENCOUNTER — Encounter (HOSPITAL_COMMUNITY): Payer: Self-pay | Admitting: Family Medicine

## 2019-11-19 ENCOUNTER — Other Ambulatory Visit: Payer: Self-pay

## 2019-11-19 ENCOUNTER — Inpatient Hospital Stay (HOSPITAL_COMMUNITY): Payer: BC Managed Care – PPO

## 2019-11-19 ENCOUNTER — Inpatient Hospital Stay (HOSPITAL_COMMUNITY)
Admission: AD | Admit: 2019-11-19 | Discharge: 2019-11-19 | Discharge status: Against Medical Advice | Payer: BC Managed Care – PPO | Attending: Family Medicine | Admitting: Family Medicine

## 2019-11-19 DIAGNOSIS — Z5329 Procedure and treatment not carried out because of patient's decision for other reasons: Secondary | ICD-10-CM | POA: Diagnosis not present

## 2019-11-19 DIAGNOSIS — O26899 Other specified pregnancy related conditions, unspecified trimester: Secondary | ICD-10-CM

## 2019-11-19 DIAGNOSIS — O26891 Other specified pregnancy related conditions, first trimester: Secondary | ICD-10-CM | POA: Insufficient documentation

## 2019-11-19 DIAGNOSIS — O3680X0 Pregnancy with inconclusive fetal viability, not applicable or unspecified: Secondary | ICD-10-CM | POA: Diagnosis not present

## 2019-11-19 DIAGNOSIS — Z6790 Unspecified blood type, Rh positive: Secondary | ICD-10-CM

## 2019-11-19 DIAGNOSIS — Z3A01 Less than 8 weeks gestation of pregnancy: Secondary | ICD-10-CM | POA: Insufficient documentation

## 2019-11-19 DIAGNOSIS — R102 Pelvic and perineal pain: Secondary | ICD-10-CM | POA: Insufficient documentation

## 2019-11-19 LAB — URINALYSIS, ROUTINE W REFLEX MICROSCOPIC
Bilirubin Urine: NEGATIVE
Glucose, UA: NEGATIVE mg/dL
Hgb urine dipstick: NEGATIVE
Ketones, ur: NEGATIVE mg/dL
Leukocytes,Ua: NEGATIVE
Nitrite: NEGATIVE
Protein, ur: 30 mg/dL — AB
Specific Gravity, Urine: 1.018 (ref 1.005–1.030)
pH: 8 (ref 5.0–8.0)

## 2019-11-19 LAB — CBC WITH DIFFERENTIAL/PLATELET
Abs Immature Granulocytes: 0.02 10*3/uL (ref 0.00–0.07)
Basophils Absolute: 0 10*3/uL (ref 0.0–0.1)
Basophils Relative: 0 %
Eosinophils Absolute: 0.1 10*3/uL (ref 0.0–0.5)
Eosinophils Relative: 1 %
HCT: 41.3 % (ref 36.0–46.0)
Hemoglobin: 13.5 g/dL (ref 12.0–15.0)
Immature Granulocytes: 0 %
Lymphocytes Relative: 24 %
Lymphs Abs: 1.8 10*3/uL (ref 0.7–4.0)
MCH: 28.6 pg (ref 26.0–34.0)
MCHC: 32.7 g/dL (ref 30.0–36.0)
MCV: 87.5 fL (ref 80.0–100.0)
Monocytes Absolute: 0.4 10*3/uL (ref 0.1–1.0)
Monocytes Relative: 6 %
Neutro Abs: 5.1 10*3/uL (ref 1.7–7.7)
Neutrophils Relative %: 69 %
Platelets: 284 10*3/uL (ref 150–400)
RBC: 4.72 MIL/uL (ref 3.87–5.11)
RDW: 13.7 % (ref 11.5–15.5)
WBC: 7.5 10*3/uL (ref 4.0–10.5)
nRBC: 0 % (ref 0.0–0.2)

## 2019-11-19 LAB — COMPREHENSIVE METABOLIC PANEL
ALT: 16 U/L (ref 0–44)
AST: 12 U/L — ABNORMAL LOW (ref 15–41)
Albumin: 3.7 g/dL (ref 3.5–5.0)
Alkaline Phosphatase: 63 U/L (ref 38–126)
Anion gap: 9 (ref 5–15)
BUN: 5 mg/dL — ABNORMAL LOW (ref 6–20)
CO2: 25 mmol/L (ref 22–32)
Calcium: 8.9 mg/dL (ref 8.9–10.3)
Chloride: 103 mmol/L (ref 98–111)
Creatinine, Ser: 0.66 mg/dL (ref 0.44–1.00)
GFR calc Af Amer: >60 mL/min (ref >60)
GFR calc non Af Amer: >60 mL/min (ref >60)
Glucose, Bld: 86 mg/dL (ref 70–99)
Potassium: 3.7 mmol/L (ref 3.5–5.1)
Sodium: 137 mmol/L (ref 135–145)
Total Bilirubin: 0.2 mg/dL — ABNORMAL LOW (ref 0.3–1.2)
Total Protein: 6.8 g/dL (ref 6.5–8.1)

## 2019-11-19 LAB — WET PREP, GENITAL
Sperm: NONE SEEN
Trich, Wet Prep: NONE SEEN
Yeast Wet Prep HPF POC: NONE SEEN

## 2019-11-19 LAB — HCG, QUANTITATIVE, PREGNANCY: hCG, Beta Chain, Quant, S: 338 m[IU]/mL — ABNORMAL HIGH (ref <5)

## 2019-11-19 LAB — POCT PREGNANCY, URINE: Preg Test, Ur: POSITIVE — AB

## 2019-11-19 NOTE — MAU Note (Signed)
Pt unable to wait for results, states she needs to pick up her child. AMA paper signed. Provider made aware.

## 2019-11-19 NOTE — MAU Note (Signed)
.   Kristin Lyons is a 29 y.o. at [redacted]w[redacted]d here in MAU reporting: that she had a positive pregnancy test this week and has had pain in her right lower abdomen for a couple of weeks. PT has a hx of an ectopic LMP: 10/02/19 Onset of complaint: couple of weeks Pain score: 7 Vitals:   11/19/19 1057  BP: 139/70  Pulse: 84  Resp: 16  Temp: 98.6 F (37 C)  SpO2: 99%     FHT: Lab orders placed from triage: UA/UPT

## 2019-11-19 NOTE — MAU Provider Note (Signed)
History     CSN: [id number]  Arrival date and time: 11/19/19 1039   First Provider Initiated Contact with Patient 11/19/19 1125      Chief Complaint  Patient presents with  . Headache  . Abdominal Pain   [name] is a 29 y.o. [id number] at [redacted]w[redacted]d who presents to MAU for right-sided pelvic pain. Patient does have a history of an ectopic pregnancy that was resolved with a salpingectomy on the right side in 2018/2019.  Onset: two weeks Location: right-sided Duration: two weeks Character: menstrual-like cramping Aggravating/Associated: none/none Relieving: lying down Treatment: Tylenol - works sometimes Severity: 7/10  Pt denies VB, vaginal discharge/odor/itching. Pt denies N/V, abdominal pain, constipation, diarrhea, or urinary problems. Pt denies fever, chills, fatigue, sweating or changes in appetite. Pt denies SOB or chest pain. Pt denies dizziness, HA, light-headedness, weakness.  Problems this pregnancy include: pt has not yet been seen. Allergies? Shellfish, NKDA Current medications/supplements? none Prenatal care provider? [facility], no appt yet scheduled, but pt reports she will today   OB History    Gravida  7   Para  3   Term  2   Preterm  0   AB  3   Living  2     SAB  2   TAB  0   Ectopic  1   Multiple  0   Live Births  2           Past Medical History:  Diagnosis Date  . Asthma   . Asthma   . Bacterial infection   . History of chicken pox   . Trichomonas   . Yeast infection     Past Surgical History:  Procedure Laterality Date  . DILATION AND CURETTAGE OF UTERUS    . DILATION AND CURETTAGE OF UTERUS N/A 09/17/2014   Procedure: DILATATION AND CURETTAGE;  Surgeon: Purcell Nails, MD;  Location: [facility];  Service: Gynecology;  Laterality: N/A;  . DILATION AND EVACUATION N/A 09/17/2014   Procedure: DILATATION AND EVACUATION;  Surgeon: Purcell Nails, MD;  Location: [facility];  Service: Gynecology;  Laterality:  N/A;  . LAPAROSCOPY N/A 03/18/2017   Procedure: LAPAROSCOPY OPERATIVE WITH REMOVAL LEFT ECTOPIC PREGNANCY;  Surgeon: Silverio Lay, MD;  Location: [facility];  Service: Gynecology;  Laterality: N/A;  . ORIF ORBITAL FRACTURE Left 06/13/2016   Procedure: OPEN REDUCTION INTERNAL FIXATION (ORIF) ORBITAL FRACTURE;  Surgeon: Alena Bills Dillingham, DO;  Location: [facility];  Service: Plastics;  Laterality: Left;    Family History  Problem Relation Age of Onset  . Hypertension Maternal Uncle   . Hypertension Paternal Uncle     Social History   Tobacco Use  . Smoking status: Never Smoker  . Smokeless tobacco: Never Used  Substance Use Topics  . Alcohol use: No  . Drug use: No    Allergies:  Allergies  Allergen Reactions  . Shellfish Allergy Hives    No medications prior to admission.    Review of Systems  Constitutional: Negative for chills, diaphoresis, fatigue and fever.  Eyes: Negative for visual disturbance.  Respiratory: Negative for shortness of breath.   Cardiovascular: Negative for chest pain.  Gastrointestinal: Negative for abdominal pain, constipation, diarrhea, nausea and vomiting.  Genitourinary: Positive for pelvic pain. Negative for dysuria, flank pain, frequency, urgency, vaginal bleeding and vaginal discharge.  Neurological: Negative for dizziness, weakness, light-headedness and headaches.   Physical Exam   Blood pressure 139/70, pulse 84, temperature 98.6 F (37 C), resp. rate  16, height 5\' 5"  (1.651 m), weight 84.8 kg, last menstrual period 10/02/2019, SpO2 99 %, unknown if currently breastfeeding.  Patient Vitals for the past 24 hrs:  BP Temp Pulse Resp SpO2 Height Weight  11/19/19 1057 139/70 98.6 F (37 C) 84 16 99 % 5\' 5"  (1.651 m) 84.8 kg   Physical Exam  Constitutional: She is oriented to person, place, and time. She appears well-developed and well-nourished. No distress.  HENT:  Head: Normocephalic and atraumatic.  Respiratory: Effort normal.  GI: Soft.  She exhibits no distension and no mass. There is no abdominal tenderness. There is no rebound and no guarding.  Neurological: She is alert and oriented to person, place, and time.  Skin: Skin is warm and dry. She is not diaphoretic.  Psychiatric: She has a normal mood and affect. Her behavior is normal. Judgment and thought content normal.  Pelvic exam deferred d/t symptoms.  Results for orders placed or performed during the hospital encounter of 11/19/19 (from the past 24 hour(s))  Pregnancy, urine POC     Status: Abnormal   Collection Time: 11/19/19 10:54 AM  Result Value Ref Range   Preg Test, Ur POSITIVE (A) NEGATIVE  Urinalysis, Routine w reflex microscopic     Status: Abnormal   Collection Time: 11/19/19 11:03 AM  Result Value Ref Range   Color, Urine YELLOW YELLOW   APPearance HAZY (A) CLEAR   Specific Gravity, Urine 1.018 1.005 - 1.030   pH 8.0 5.0 - 8.0   Glucose, UA NEGATIVE NEGATIVE mg/dL   Hgb urine dipstick NEGATIVE NEGATIVE   Bilirubin Urine NEGATIVE NEGATIVE   Ketones, ur NEGATIVE NEGATIVE mg/dL   Protein, ur 30 (A) NEGATIVE mg/dL   Nitrite NEGATIVE NEGATIVE   Leukocytes,Ua NEGATIVE NEGATIVE   RBC / HPF 0-5 0 - 5 RBC/hpf   WBC, UA 0-5 0 - 5 WBC/hpf   Bacteria, UA RARE (A) NONE SEEN   Squamous Epithelial / LPF 6-10 0 - 5   Non Squamous Epithelial 0-5 (A) NONE SEEN  Wet prep, genital     Status: Abnormal   Collection Time: 11/19/19 11:32 AM  Result Value Ref Range   Yeast Wet Prep HPF POC NONE SEEN NONE SEEN   Trich, Wet Prep NONE SEEN NONE SEEN   Clue Cells Wet Prep HPF POC PRESENT (A) NONE SEEN   WBC, Wet Prep HPF POC MANY (A) NONE SEEN   Sperm NONE SEEN   CBC with Differential/Platelet     Status: None   Collection Time: 11/19/19 12:52 PM  Result Value Ref Range   WBC 7.5 4.0 - 10.5 K/uL   RBC 4.72 3.87 - 5.11 MIL/uL   Hemoglobin 13.5 12.0 - 15.0 g/dL   HCT 11/21/19 11/21/19 - 38.4 %   MCV 87.5 80.0 - 100.0 fL   MCH 28.6 26.0 - 34.0 pg   MCHC 32.7 30.0 - 36.0  g/dL   RDW 66.5 99.3 - 57.0 %   Platelets 284 150 - 400 K/uL   nRBC 0.0 0.0 - 0.2 %   Neutrophils Relative % 69 %   Neutro Abs 5.1 1.7 - 7.7 K/uL   Lymphocytes Relative 24 %   Lymphs Abs 1.8 0.7 - 4.0 K/uL   Monocytes Relative 6 %   Monocytes Absolute 0.4 0.1 - 1.0 K/uL   Eosinophils Relative 1 %   Eosinophils Absolute 0.1 0.0 - 0.5 K/uL   Basophils Relative 0 %   Basophils Absolute 0.0 0.0 - 0.1 K/uL   Immature  Granulocytes 0 %   Abs Immature Granulocytes 0.02 0.00 - 0.07 K/uL  Comprehensive metabolic panel     Status: Abnormal   Collection Time: 11/19/19 12:52 PM  Result Value Ref Range   Sodium 137 135 - 145 mmol/L   Potassium 3.7 3.5 - 5.1 mmol/L   Chloride 103 98 - 111 mmol/L   CO2 25 22 - 32 mmol/L   Glucose, Bld 86 70 - 99 mg/dL   BUN 5 (L) 6 - 20 mg/dL   Creatinine, Ser 7.12 0.44 - 1.00 mg/dL   Calcium 8.9 8.9 - 45.8 mg/dL   Total Protein 6.8 6.5 - 8.1 g/dL   Albumin 3.7 3.5 - 5.0 g/dL   AST 12 (L) 15 - 41 U/L   ALT 16 0 - 44 U/L   Alkaline Phosphatase 63 38 - 126 U/L   Total Bilirubin 0.2 (L) 0.3 - 1.2 mg/dL   GFR calc non Af Amer >60 >60 mL/min   GFR calc Af Amer >60 >60 mL/min   Anion gap 9 5 - 15  hCG, quantitative, pregnancy     Status: Abnormal   Collection Time: 11/19/19 12:52 PM  Result Value Ref Range   hCG, Beta Chain, Quant, S 338 (H) <5 mIU/mL   US OB LESS THAN 14 WEEKS WITH OB TRANSVAGINAL  Result Date: 11/19/2019 CLINICAL DATA:  Cramping pelvic pain. Positive urine pregnancy test. EXAM: OBSTETRIC <14 WK [place] AND TRANSVAGINAL OB US TECHNIQUE: Both transabdominal and transvaginal ultrasound examinations were performed for complete evaluation of the gestation as well as the maternal uterus, adnexal regions, and pelvic cul-de-sac. Transvaginal technique was performed to assess early pregnancy. COMPARISON:  None. FINDINGS: Intrauterine gestational sac: None Yolk sac:  N/A Embryo:  N/A Cardiac Activity: N/A Heart Rate: N/A bpm Subchorionic hemorrhage:  N/A  Maternal uterus/adnexae: Both ovaries are normal.  No adnexal mass. Small amount of free fluid in the cul-de-sac. IMPRESSION: 1. No intrauterine gestational sac is identified. No endometrial thickening or endometrial fluid. 2. Normal ovaries. 3. Small amount of free pelvic fluid. Electronically Signed   By: Rudie Meyer M.D.   On: 11/19/2019 13:11    MAU Course  Procedures  MDM -r/o ectopic -patient left AMA with lab and [place] results pending -UA: hazy/30PRO/rare bacteria/0-5non-squamos epithelial cells, sending urine for culture -CBC: WNL -CMP: no abnormalities requiring treatment -[place]: pregnancy of unknown anatomic location -hCG: 338 -ABO: O Positive -WetPrep: +ClueCells (isolated finding not requiring treatment) -GC/CT collected  Orders Placed This Encounter  Procedures  . Wet prep, genital    Standing Status:   Standing    Number of Occurrences:   1  . Culture, OB Urine    Standing Status:   Standing    Number of Occurrences:   1  . US OB LESS THAN 14 WEEKS WITH OB TRANSVAGINAL    Standing Status:   Standing    Number of Occurrences:   1    Order Specific Question:   Symptom/Reason for Exam    Answer:   Pelvic pain in pregnancy [[id number]]  . Urinalysis, Routine w reflex microscopic    Standing Status:   Standing    Number of Occurrences:   1  . CBC with Differential/Platelet    Standing Status:   Standing    Number of Occurrences:   1  . Comprehensive metabolic panel    Standing Status:   Standing    Number of Occurrences:   1  . hCG, quantitative, pregnancy    Standing  Status:   Standing    Number of Occurrences:   1  . Pregnancy, urine POC    Standing Status:   Standing    Number of Occurrences:   1    Assessment and Plan   1. Pregnancy of unknown anatomic location   2. Pelvic pain in pregnancy   3. Blood type, Rh positive    -patient left AMA prior to results being reported -patient called to discuss results -patient identified by three  identifiers -discussed results with patient, patient to return to MAU on Saturday 11/21/2019 at noon for repeat hCG  -discussed use of Tylenol in pregnancy and limit of 4,000mg  daily -strict ectopic/bleeding/pain/return MAU precautions given  Gerrie Nordmann Virga Haltiwanger 11/19/2019, 4:16 PM

## 2019-11-20 LAB — CULTURE, OB URINE

## 2019-11-20 LAB — GC/CHLAMYDIA PROBE AMP (~~LOC~~) NOT AT ARMC
Chlamydia: NEGATIVE
Comment: NEGATIVE
Comment: NORMAL
Neisseria Gonorrhea: NEGATIVE

## 2019-11-21 ENCOUNTER — Inpatient Hospital Stay (HOSPITAL_COMMUNITY)
Admission: AD | Admit: 2019-11-21 | Discharge: 2019-11-21 | Disposition: A | Discharge status: Home / Self Care | Payer: BC Managed Care – PPO | Attending: Obstetrics and Gynecology | Admitting: Obstetrics and Gynecology

## 2019-11-21 ENCOUNTER — Other Ambulatory Visit: Payer: Self-pay

## 2019-11-21 DIAGNOSIS — O26891 Other specified pregnancy related conditions, first trimester: Secondary | ICD-10-CM | POA: Diagnosis not present

## 2019-11-21 DIAGNOSIS — Z3A01 Less than 8 weeks gestation of pregnancy: Secondary | ICD-10-CM | POA: Diagnosis not present

## 2019-11-21 DIAGNOSIS — E349 Endocrine disorder, unspecified: Secondary | ICD-10-CM | POA: Diagnosis not present

## 2019-11-21 LAB — HCG, QUANTITATIVE, PREGNANCY: hCG, Beta Chain, Quant, S: 955 m[IU]/mL — ABNORMAL HIGH (ref <5)

## 2019-11-21 NOTE — MAU Note (Signed)
Gilma D Vine is a 29 y.o. at [redacted]w[redacted]d here in MAU reporting: here for follow up hcg. Denies pain or bleeding.  Pain score: 0/10  Vitals:   11/21/19 1320  BP: (!) 142/87  Pulse: 79  Resp: 16  Temp: 99.4 F (37.4 C)  SpO2: 99%     Lab orders placed from triage: hcg

## 2019-11-21 NOTE — Discharge Instructions (Signed)

## 2019-11-21 NOTE — MAU Provider Note (Signed)
History   Chief Complaint:  Follow-up  Kristin Lyons is  29 y.o. [id number] Patient's last menstrual period was 10/02/2019.Marland Kitchen Patient is here for follow up of quantitative HCG and ongoing surveillance of pregnancy status. She is [redacted]w[redacted]d weeks gestation  by LMP.    Since her last visit, the patient is without new complaint. The patient reports bleeding as  none now.  She denies any pain.  General ROS:  negative  Her previous Quantitative HCG values are:  Results for Kristin, Lyons (MRN [id number]) as of 11/21/2019 15:09  Ref. Range 11/19/2019 12:52  HCG, Beta Chain, Quant, S Latest Ref Range: <5 mIU/mL 338 (H)   Physical Exam   Blood pressure (!) 142/87, pulse 79, temperature 99.4 F (37.4 C), temperature source Oral, resp. rate 16, last menstrual period 10/02/2019, SpO2 99 %, unknown if currently breastfeeding.  Focused Gynecological Exam: examination not indicated  Labs: Results for orders placed or performed during the hospital encounter of 11/21/19 (from the past 24 hour(s))  hCG, quantitative, pregnancy   Collection Time: 11/21/19  1:56 PM  Result Value Ref Range   hCG, Beta Chain, Quant, S 955 (H) <5 mIU/mL   Assessment:   1. Elevated serum hCG     Plan: -Discharge home in stable condition -First trimester precautions discussed -Patient advised to follow-up with [facility] u/s in 2 weeks for repeat ultrasound for viability. Order placed -Patient may return to MAU as needed or if her condition were to change or worsen  Rolm Bookbinder, CNM 11/21/2019, 3:09 PM

## 2019-12-07 ENCOUNTER — Other Ambulatory Visit: Payer: Self-pay

## 2019-12-07 ENCOUNTER — Ambulatory Visit (HOSPITAL_COMMUNITY)
Admission: RE | Admit: 2019-12-07 | Discharge: 2019-12-07 | Disposition: A | Discharge status: Home / Self Care | Payer: BC Managed Care – PPO | Source: Ambulatory Visit

## 2019-12-07 ENCOUNTER — Ambulatory Visit (INDEPENDENT_AMBULATORY_CARE_PROVIDER_SITE_OTHER): Payer: BC Managed Care – PPO

## 2019-12-07 ENCOUNTER — Encounter: Payer: Self-pay | Admitting: Family Medicine

## 2019-12-07 DIAGNOSIS — O3680X0 Pregnancy with inconclusive fetal viability, not applicable or unspecified: Secondary | ICD-10-CM

## 2019-12-07 DIAGNOSIS — E349 Endocrine disorder, unspecified: Secondary | ICD-10-CM | POA: Diagnosis not present

## 2019-12-07 MED ORDER — PROMETHAZINE HCL 25 MG PO TABS: 25.0000 mg | ORAL_TABLET | Freq: Four times a day (QID) | ORAL | 0 refills | Status: DC | PRN

## 2019-12-07 NOTE — Progress Notes (Signed)
Pt here today for OB US results for viability.  Reveiwed results with Dr. Earlene Plater who recommends that pt start [facility] and start taking a PNV.  Pt informed that she has a viable pregnancy with EDD 07/31/2020, 6w 1d today, and FHR 126 bpm.  [place] pics given to pt by Radiology.  Medications/allergies reviewed.  Pt reports that she would like something prescribed for nausea.  Per protocol pt can have Phenergan 25 mg prescribed, medication e-prescribed.  List of medicines safe to take in pregnancy provided.  Front office to provide proof of pregnancy letter to start OB care.  Pt states that she is unsure if she is moving to [facility] or not.  I explained to the pt that she would not start OB care until she is around 11-12 weeks so does have some time however she can still schedule an appt to so that she at least has an appt and can cancel if needed.  Pt verbalized understanding.    Addison Naegeli, RN  12/07/19

## 2019-12-08 NOTE — Progress Notes (Signed)
I have reviewed this chart and agree with the RN/CMA assessment and management.    K. Meryl Cathren Sween, M.D. Attending [facility] (Faculty Practice)   

## 2019-12-19 ENCOUNTER — Encounter (HOSPITAL_COMMUNITY): Payer: Self-pay | Admitting: Obstetrics and Gynecology

## 2019-12-19 ENCOUNTER — Other Ambulatory Visit: Payer: Self-pay

## 2019-12-19 ENCOUNTER — Inpatient Hospital Stay (HOSPITAL_COMMUNITY)
Admission: AD | Admit: 2019-12-19 | Discharge: 2019-12-19 | Disposition: A | Discharge status: Home / Self Care | Payer: BC Managed Care – PPO | Attending: Obstetrics and Gynecology | Admitting: Obstetrics and Gynecology

## 2019-12-19 DIAGNOSIS — O210 Mild hyperemesis gravidarum: Secondary | ICD-10-CM

## 2019-12-19 DIAGNOSIS — O219 Vomiting of pregnancy, unspecified: Secondary | ICD-10-CM | POA: Diagnosis not present

## 2019-12-19 DIAGNOSIS — Z3A11 11 weeks gestation of pregnancy: Secondary | ICD-10-CM | POA: Insufficient documentation

## 2019-12-19 LAB — URINALYSIS, ROUTINE W REFLEX MICROSCOPIC
Bilirubin Urine: NEGATIVE
Glucose, UA: NEGATIVE mg/dL
Hgb urine dipstick: NEGATIVE
Ketones, ur: NEGATIVE mg/dL
Nitrite: NEGATIVE
Protein, ur: 30 mg/dL — AB
Specific Gravity, Urine: 1.029 (ref 1.005–1.030)
pH: 6 (ref 5.0–8.0)

## 2019-12-19 MED ORDER — FAMOTIDINE 20 MG PO TABS
20.0000 mg | ORAL_TABLET | Freq: Once | ORAL | Status: AC
  Administered 2019-12-19: 20 mg via ORAL
  Filled 2019-12-19: qty 1

## 2019-12-19 MED ORDER — ONDANSETRON 8 MG PO TBDP
8.0000 mg | ORAL_TABLET | Freq: Three times a day (TID) | ORAL | 0 refills | Status: DC | PRN
Start: 2019-12-19 — End: 2020-07-17

## 2019-12-19 MED ORDER — FAMOTIDINE 20 MG PO TABS
20.0000 mg | ORAL_TABLET | Freq: Two times a day (BID) | ORAL | 0 refills | Status: DC
Start: 2019-12-19 — End: 2020-07-17

## 2019-12-19 MED ORDER — ONDANSETRON 4 MG PO TBDP
8.0000 mg | ORAL_TABLET | Freq: Once | ORAL | Status: AC
  Administered 2019-12-19: 8 mg via ORAL
  Filled 2019-12-19: qty 2

## 2019-12-19 NOTE — Discharge Instructions (Signed)

## 2019-12-19 NOTE — MAU Note (Signed)
Kristin Lyons is a 29 y.o. at [redacted]w[redacted]d here in MAU reporting:  +Nausea and vomiting Unable to keep anything to eat or drink down Endorses 7 vomiting episodes in the last 24 hours. +metallic taste in her mouth Onset of complaint: ongoing since May 10th. States she has been taking the medication given at [employer] but it is not working.  Pain score: denies Vitals:   12/19/19 1812  BP: (!) 141/76  Pulse: 94  Resp: 16  Temp: 98.7 F (37.1 C)  SpO2: 98%     Lab orders placed from triage: ua

## 2019-12-19 NOTE — MAU Provider Note (Signed)
History     CSN: [id number]  Arrival date and time: 12/19/19 1741   First Provider Initiated Contact with Patient 12/19/19 1835      Chief Complaint  Patient presents with  . Emesis   HPI Kristin Lyons is a 29 y.o. [id number] at [redacted]w[redacted]d who presents with nausea and vomiting. She states she has thrown up more than 7 times in the last 24 hours. She states the phenergan isn't helping and just making her sleepy. Denies any vaginal bleeding, discharge or leaking. Denies any pain.   OB History    Gravida  7   Para  3   Term  2   Preterm  0   AB  3   Living  2     SAB  2   TAB  0   Ectopic  1   Multiple  0   Live Births  2           Past Medical History:  Diagnosis Date  . Asthma   . Asthma   . Bacterial infection   . History of chicken pox   . Trichomonas   . Yeast infection     Past Surgical History:  Procedure Laterality Date  . DILATION AND CURETTAGE OF UTERUS    . DILATION AND CURETTAGE OF UTERUS N/A 09/17/2014   Procedure: DILATATION AND CURETTAGE;  Surgeon: Purcell Nails, MD;  Location: [facility];  Service: Gynecology;  Laterality: N/A;  . DILATION AND EVACUATION N/A 09/17/2014   Procedure: DILATATION AND EVACUATION;  Surgeon: Purcell Nails, MD;  Location: [facility];  Service: Gynecology;  Laterality: N/A;  . LAPAROSCOPY N/A 03/18/2017   Procedure: LAPAROSCOPY OPERATIVE WITH REMOVAL LEFT ECTOPIC PREGNANCY;  Surgeon: Silverio Lay, MD;  Location: [facility];  Service: Gynecology;  Laterality: N/A;  . ORIF ORBITAL FRACTURE Left 06/13/2016   Procedure: OPEN REDUCTION INTERNAL FIXATION (ORIF) ORBITAL FRACTURE;  Surgeon: Alena Bills Dillingham, DO;  Location: [facility];  Service: Plastics;  Laterality: Left;    Family History  Problem Relation Age of Onset  . Hypertension Maternal Uncle   . Hypertension Paternal Uncle     Social History   Tobacco Use  . Smoking status: Never Smoker  . Smokeless tobacco: Never Used  Substance Use Topics  . Alcohol use: No   . Drug use: No    Allergies:  Allergies  Allergen Reactions  . Shellfish Allergy Hives    Medications Prior to Admission  Medication Sig Dispense Refill Last Dose  . acetaminophen (TYLENOL) 325 MG tablet Take 650 mg by mouth every 6 (six) hours as needed for mild pain or headache.   Past Month at Unknown time  . Prenatal Vit-Fe Fumarate-FA (PRENATAL MULTIVITAMIN) TABS tablet Take 1 tablet by mouth daily at 12 noon.   12/19/2019 at Unknown time  . promethazine (PHENERGAN) 25 MG tablet Take 1 tablet (25 mg total) by mouth every 6 (six) hours as needed for nausea or vomiting. 30 tablet 0 12/19/2019 at Unknown time    Review of Systems  Constitutional: Negative.  Negative for fatigue and fever.  HENT: Negative.   Respiratory: Negative.  Negative for shortness of breath.   Cardiovascular: Negative.  Negative for chest pain.  Gastrointestinal: Positive for nausea and vomiting. Negative for abdominal pain, constipation and diarrhea.  Genitourinary: Negative.  Negative for dysuria.  Neurological: Negative.  Negative for dizziness and headaches.   Physical Exam   Blood pressure (!) 141/76, pulse 94, temperature 98.7 F (37.1 C),  temperature source Oral, resp. rate 16, weight 83 kg, last menstrual period 10/02/2019, SpO2 98 %, unknown if currently breastfeeding.  Physical Exam  Nursing note and vitals reviewed. Constitutional: She is oriented to person, place, and time. She appears well-developed and well-nourished. No distress.  HENT:  Head: Normocephalic.  Eyes: Pupils are equal, round, and reactive to light.  Cardiovascular: Normal rate, regular rhythm and normal heart sounds.  Respiratory: Effort normal and breath sounds normal. No respiratory distress.  GI: Soft. Bowel sounds are normal. She exhibits no distension. There is no abdominal tenderness.  Neurological: She is alert and oriented to person, place, and time.  Skin: Skin is warm and dry.  Psychiatric: She has a normal mood  and affect. Her behavior is normal. Judgment and thought content normal.    FHT: 146bpm  MAU Course  Procedures Results for orders placed or performed during the hospital encounter of 12/19/19 (from the past 24 hour(s))  Urinalysis, Routine w reflex microscopic     Status: Abnormal   Collection Time: 12/19/19  6:15 PM  Result Value Ref Range   Color, Urine YELLOW YELLOW   APPearance HAZY (A) CLEAR   Specific Gravity, Urine 1.029 1.005 - 1.030   pH 6.0 5.0 - 8.0   Glucose, UA NEGATIVE NEGATIVE mg/dL   Hgb urine dipstick NEGATIVE NEGATIVE   Bilirubin Urine NEGATIVE NEGATIVE   Ketones, ur NEGATIVE NEGATIVE mg/dL   Protein, ur 30 (A) NEGATIVE mg/dL   Nitrite NEGATIVE NEGATIVE   Leukocytes,Ua SMALL (A) NEGATIVE   RBC / HPF 0-5 0 - 5 RBC/hpf   WBC, UA 6-10 0 - 5 WBC/hpf   Bacteria, UA RARE (A) NONE SEEN   Squamous Epithelial / LPF 6-10 0 - 5   Mucus PRESENT    MDM UA Zofran ODT Pepcid  Pt informed that the ultrasound is considered a limited OB ultrasound and is not intended to be a complete ultrasound exam.  Patient also informed that the ultrasound is not being completed with the intent of assessing for fetal or placental anomalies or any pelvic abnormalities.  Explained that the purpose of today's ultrasound is to assess for  viability.  Patient acknowledges the purpose of the exam and the limitations of the study.  FHR confirmed on bedside u/s  Patient reports improvement of symptoms Assessment and Plan   1. Morning sickness    -Discharge home in stable condition -Rx for zofran and pepcid sent to patient's pharmacy -Nausea and vomiting precautions discussed -Patient advised to follow-up with OB of choice to start prenatal care -Patient may return to MAU as needed or if her condition were to change or worsen   [place] 12/19/2019, 6:35 PM

## 2019-12-30 LAB — OB RESULTS CONSOLE ABO/RH

## 2019-12-30 LAB — OB RESULTS CONSOLE RUBELLA ANTIBODY, IGM: Rubella: IMMUNE

## 2020-01-06 LAB — OB RESULTS CONSOLE HEPATITIS B SURFACE ANTIGEN: Hepatitis B Surface Ag: NEGATIVE

## 2020-01-06 LAB — OB RESULTS CONSOLE GC/CHLAMYDIA
Chlamydia: NEGATIVE
Gonorrhea: NEGATIVE

## 2020-01-06 LAB — OB RESULTS CONSOLE RPR: RPR: NONREACTIVE

## 2020-01-06 LAB — OB RESULTS CONSOLE ANTIBODY SCREEN: Antibody Screen: NEGATIVE

## 2020-01-06 LAB — OB RESULTS CONSOLE HIV ANTIBODY (ROUTINE TESTING): HIV: NONREACTIVE

## 2020-01-06 LAB — OB RESULTS CONSOLE ABO/RH: RH Type: POSITIVE

## 2020-02-11 DIAGNOSIS — R87610 Atypical squamous cells of undetermined significance on cytologic smear of cervix (ASC-US): Secondary | ICD-10-CM | POA: Insufficient documentation

## 2020-07-04 ENCOUNTER — Inpatient Hospital Stay (HOSPITAL_COMMUNITY)
Admission: AD | Admit: 2020-07-04 | Discharge: 2020-07-04 | Disposition: A | Discharge status: Home / Self Care | Payer: BC Managed Care – PPO | Attending: Obstetrics and Gynecology | Admitting: Obstetrics and Gynecology

## 2020-07-04 ENCOUNTER — Encounter (HOSPITAL_COMMUNITY): Payer: Self-pay | Admitting: Obstetrics and Gynecology

## 2020-07-04 ENCOUNTER — Other Ambulatory Visit: Payer: Self-pay

## 2020-07-04 DIAGNOSIS — O4703 False labor before 37 completed weeks of gestation, third trimester: Secondary | ICD-10-CM

## 2020-07-04 DIAGNOSIS — Z3689 Encounter for other specified antenatal screening: Secondary | ICD-10-CM | POA: Diagnosis not present

## 2020-07-04 DIAGNOSIS — Z3A36 36 weeks gestation of pregnancy: Secondary | ICD-10-CM | POA: Diagnosis not present

## 2020-07-04 DIAGNOSIS — O479 False labor, unspecified: Secondary | ICD-10-CM

## 2020-07-04 NOTE — MAU Note (Signed)
Having pressure in pelvis and having contractions since 12 last night, maybe every 2 min now. Denies bleeding or leaking.

## 2020-07-04 NOTE — MAU Note (Signed)
I have communicated with Carloyn Jaeger CNM  and reviewed vital signs:  Vitals:   07/04/20 1259 07/04/20 1300  BP:  128/70  Pulse: 87 86  Resp:    Temp: 98.3 F (36.8 C)   SpO2: 98%     Vaginal exam:  Dilation: 2.5 Effacement (%): 50 Cervical Position: Posterior Station: -3 Presentation: Vertex Exam by:: T Roberta Angell RN ,   Also reviewed contraction pattern and that non-stress test is reactive.  It has been documented that patient is contracting every irregularly with no cervical change over one hour  not indicating active labor.  Patient denies any other complaints.  Based on this report provider has given order for discharge.  A discharge order and diagnosis entered by a provider.   Labor discharge instructions reviewed with patient.

## 2020-07-04 NOTE — MAU Provider Note (Signed)
Ms. TERRYL NIZIOLEK is a [id number] at [redacted]w[redacted]d seen in MAU for labor. RN labor check, not seen by provider. SVE by RN Dilation: 2.5 Effacement (%): 50 Cervical Position: Posterior Station: -3 Presentation: Vertex Exam by:: T LYTLE RN    NST - REACTIVE FHR: 150 bpm / moderate variability / accels present / decels absent / TOCO: irregular UC's   Plan: D/C home with labor precautions Need to schedule appt with [facility] in 1 week  Raelyn Mora, CNM  07/04/2020 12:51 PM

## 2020-07-12 LAB — OB RESULTS CONSOLE GBS: GBS: POSITIVE

## 2020-07-16 ENCOUNTER — Encounter (HOSPITAL_COMMUNITY): Payer: Self-pay | Admitting: Obstetrics and Gynecology

## 2020-07-16 ENCOUNTER — Observation Stay (HOSPITAL_COMMUNITY)
Admission: AD | Admit: 2020-07-16 | Discharge: 2020-07-17 | Disposition: A | Discharge status: Home / Self Care | Payer: BC Managed Care – PPO | Source: Home / Self Care | Attending: Obstetrics and Gynecology | Admitting: Obstetrics and Gynecology

## 2020-07-16 ENCOUNTER — Other Ambulatory Visit: Payer: Self-pay

## 2020-07-16 DIAGNOSIS — D649 Anemia, unspecified: Secondary | ICD-10-CM | POA: Diagnosis present

## 2020-07-16 DIAGNOSIS — Z91013 Allergy to seafood: Secondary | ICD-10-CM

## 2020-07-16 DIAGNOSIS — J45909 Unspecified asthma, uncomplicated: Secondary | ICD-10-CM | POA: Diagnosis present

## 2020-07-16 DIAGNOSIS — O9952 Diseases of the respiratory system complicating childbirth: Secondary | ICD-10-CM | POA: Diagnosis present

## 2020-07-16 DIAGNOSIS — O479 False labor, unspecified: Secondary | ICD-10-CM | POA: Diagnosis present

## 2020-07-16 DIAGNOSIS — O134 Gestational [pregnancy-induced] hypertension without significant proteinuria, complicating childbirth: Secondary | ICD-10-CM | POA: Diagnosis present

## 2020-07-16 DIAGNOSIS — O99824 Streptococcus B carrier state complicating childbirth: Secondary | ICD-10-CM | POA: Diagnosis present

## 2020-07-16 DIAGNOSIS — Z20822 Contact with and (suspected) exposure to covid-19: Secondary | ICD-10-CM | POA: Diagnosis present

## 2020-07-16 DIAGNOSIS — O9902 Anemia complicating childbirth: Secondary | ICD-10-CM | POA: Diagnosis present

## 2020-07-16 DIAGNOSIS — Z3A38 38 weeks gestation of pregnancy: Secondary | ICD-10-CM

## 2020-07-16 LAB — URINALYSIS, ROUTINE W REFLEX MICROSCOPIC
Bilirubin Urine: NEGATIVE
Glucose, UA: NEGATIVE mg/dL
Hgb urine dipstick: NEGATIVE
Ketones, ur: NEGATIVE mg/dL
Nitrite: NEGATIVE
Protein, ur: NEGATIVE mg/dL
Specific Gravity, Urine: 1.006 (ref 1.005–1.030)
WBC, UA: >50 WBC/hpf — ABNORMAL HIGH (ref 0–5)
pH: 7 (ref 5.0–8.0)

## 2020-07-16 LAB — CBC
HCT: 34.3 % — ABNORMAL LOW (ref 36.0–46.0)
Hemoglobin: 10.6 g/dL — ABNORMAL LOW (ref 12.0–15.0)
MCH: 24.7 pg — ABNORMAL LOW (ref 26.0–34.0)
MCHC: 30.9 g/dL (ref 30.0–36.0)
MCV: 80 fL (ref 80.0–100.0)
Platelets: 279 10*3/uL (ref 150–400)
RBC: 4.29 MIL/uL (ref 3.87–5.11)
RDW: 14.7 % (ref 11.5–15.5)
WBC: 8.3 10*3/uL (ref 4.0–10.5)
nRBC: 0 % (ref 0.0–0.2)

## 2020-07-16 LAB — RESP PANEL BY RT-PCR (FLU A&B, COVID) ARPGX2
Influenza A by PCR: NEGATIVE
Influenza B by PCR: NEGATIVE
SARS Coronavirus 2 by RT PCR: NEGATIVE

## 2020-07-16 MED ORDER — PHENYLEPHRINE 40 MCG/ML (10ML) SYRINGE FOR IV PUSH (FOR BLOOD PRESSURE SUPPORT): 80.0000 ug | PREFILLED_SYRINGE | INTRAVENOUS | Status: DC | PRN

## 2020-07-16 MED ORDER — OXYCODONE-ACETAMINOPHEN 5-325 MG PO TABS: 1.0000 | ORAL_TABLET | ORAL | Status: DC | PRN

## 2020-07-16 MED ORDER — OXYTOCIN-SODIUM CHLORIDE 30-0.9 UT/500ML-% IV SOLN: 2.5000 [IU]/h | INTRAVENOUS | Status: DC

## 2020-07-16 MED ORDER — FENTANYL-BUPIVACAINE-NACL 0.5-0.125-0.9 MG/250ML-% EP SOLN: 12.0000 mL/h | EPIDURAL | Status: DC | PRN

## 2020-07-16 MED ORDER — ONDANSETRON HCL 4 MG/2ML IJ SOLN: 4.0000 mg | Freq: Four times a day (QID) | INTRAMUSCULAR | Status: DC | PRN

## 2020-07-16 MED ORDER — LACTATED RINGERS IV SOLN: 500.0000 mL | INTRAVENOUS | Status: DC | PRN

## 2020-07-16 MED ORDER — OXYCODONE-ACETAMINOPHEN 5-325 MG PO TABS: 2.0000 | ORAL_TABLET | ORAL | Status: DC | PRN

## 2020-07-16 MED ORDER — FENTANYL CITRATE (PF) 100 MCG/2ML IJ SOLN
50.0000 ug | INTRAMUSCULAR | Status: DC | PRN
  Administered 2020-07-17: 50 ug via INTRAVENOUS
  Filled 2020-07-16: qty 2

## 2020-07-16 MED ORDER — LACTATED RINGERS IV SOLN: INTRAVENOUS | Status: DC

## 2020-07-16 MED ORDER — SODIUM CHLORIDE 0.9 % IV SOLN
5.0000 10*6.[IU] | Freq: Once | INTRAVENOUS | Status: AC
  Administered 2020-07-16: 5 10*6.[IU] via INTRAVENOUS
  Filled 2020-07-16: qty 5

## 2020-07-16 MED ORDER — LIDOCAINE HCL (PF) 1 % IJ SOLN: 30.0000 mL | INTRAMUSCULAR | Status: DC | PRN

## 2020-07-16 MED ORDER — PENICILLIN G POT IN DEXTROSE 60000 UNIT/ML IV SOLN
3.0000 10*6.[IU] | INTRAVENOUS | Status: DC
  Administered 2020-07-16 – 2020-07-17 (×2): 3 10*6.[IU] via INTRAVENOUS
  Filled 2020-07-16 (×2): qty 50

## 2020-07-16 MED ORDER — OXYTOCIN BOLUS FROM INFUSION: 333.0000 mL | Freq: Once | INTRAVENOUS | Status: DC

## 2020-07-16 MED ORDER — EPHEDRINE 5 MG/ML INJ: 10.0000 mg | INTRAVENOUS | Status: DC | PRN

## 2020-07-16 MED ORDER — DIPHENHYDRAMINE HCL 50 MG/ML IJ SOLN: 12.5000 mg | INTRAMUSCULAR | Status: DC | PRN

## 2020-07-16 MED ORDER — LACTATED RINGERS IV SOLN: 500.0000 mL | Freq: Once | INTRAVENOUS | Status: DC

## 2020-07-16 MED ORDER — ACETAMINOPHEN 325 MG PO TABS: 650.0000 mg | ORAL_TABLET | ORAL | Status: DC | PRN

## 2020-07-16 MED ORDER — SOD CITRATE-CITRIC ACID 500-334 MG/5ML PO SOLN: 30.0000 mL | ORAL | Status: DC | PRN
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
## 2020-07-16 NOTE — MAU Note (Signed)
Pt reports ctx started at 0800 today. Reports them 7-9 min apart since they started. Rates pain 8/10 when she has them. Pt denies LOF, vaginal bleeding, and reports +FM.

## 2020-07-17 DIAGNOSIS — O479 False labor, unspecified: Secondary | ICD-10-CM | POA: Diagnosis present

## 2020-07-17 LAB — RPR: RPR Ser Ql: NONREACTIVE
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
## 2020-07-17 NOTE — Progress Notes (Signed)
[facility] CNM at bedside discussing discharge

## 2020-07-18 ENCOUNTER — Inpatient Hospital Stay (HOSPITAL_COMMUNITY): Payer: BC Managed Care – PPO | Admitting: Anesthesiology

## 2020-07-18 ENCOUNTER — Other Ambulatory Visit: Payer: Self-pay

## 2020-07-18 ENCOUNTER — Inpatient Hospital Stay (HOSPITAL_COMMUNITY)
Admission: AD | Admit: 2020-07-18 | Discharge: 2020-07-21 | DRG: 807 | Disposition: A | Discharge status: Home / Self Care | Payer: BC Managed Care – PPO | Attending: Obstetrics & Gynecology | Admitting: Obstetrics & Gynecology

## 2020-07-18 ENCOUNTER — Encounter (HOSPITAL_COMMUNITY): Payer: Self-pay | Admitting: Obstetrics and Gynecology

## 2020-07-18 DIAGNOSIS — Z20822 Contact with and (suspected) exposure to covid-19: Secondary | ICD-10-CM | POA: Diagnosis present

## 2020-07-18 DIAGNOSIS — Z3A38 38 weeks gestation of pregnancy: Secondary | ICD-10-CM

## 2020-07-18 DIAGNOSIS — O139 Gestational [pregnancy-induced] hypertension without significant proteinuria, unspecified trimester: Secondary | ICD-10-CM | POA: Diagnosis present

## 2020-07-18 DIAGNOSIS — F419 Anxiety disorder, unspecified: Secondary | ICD-10-CM | POA: Diagnosis not present

## 2020-07-18 DIAGNOSIS — F32A Depression, unspecified: Secondary | ICD-10-CM | POA: Diagnosis not present

## 2020-07-18 DIAGNOSIS — Z87898 Personal history of other specified conditions: Secondary | ICD-10-CM

## 2020-07-18 DIAGNOSIS — O134 Gestational [pregnancy-induced] hypertension without significant proteinuria, complicating childbirth: Secondary | ICD-10-CM | POA: Diagnosis present

## 2020-07-18 DIAGNOSIS — Z8759 Personal history of other complications of pregnancy, childbirth and the puerperium: Secondary | ICD-10-CM

## 2020-07-18 DIAGNOSIS — O99824 Streptococcus B carrier state complicating childbirth: Secondary | ICD-10-CM | POA: Diagnosis present

## 2020-07-18 HISTORY — DX: Anxiety disorder, unspecified: F41.9

## 2020-07-18 HISTORY — DX: Depression, unspecified: F32.A

## 2020-07-18 LAB — TYPE AND SCREEN
ABO/RH(D): O POS
ABO/RH(D): O POS
Antibody Screen: NEGATIVE
Antibody Screen: NEGATIVE

## 2020-07-18 LAB — COMPREHENSIVE METABOLIC PANEL
ALT: 26 U/L (ref 0–44)
AST: 26 U/L (ref 15–41)
Albumin: 2.6 g/dL — ABNORMAL LOW (ref 3.5–5.0)
Alkaline Phosphatase: 128 U/L — ABNORMAL HIGH (ref 38–126)
Anion gap: 9 (ref 5–15)
BUN: 7 mg/dL (ref 6–20)
CO2: 20 mmol/L — ABNORMAL LOW (ref 22–32)
Calcium: 9.1 mg/dL (ref 8.9–10.3)
Chloride: 105 mmol/L (ref 98–111)
Creatinine, Ser: 0.57 mg/dL (ref 0.44–1.00)
GFR, Estimated: >60 mL/min (ref >60)
Glucose, Bld: 69 mg/dL — ABNORMAL LOW (ref 70–99)
Potassium: 3.8 mmol/L (ref 3.5–5.1)
Sodium: 134 mmol/L — ABNORMAL LOW (ref 135–145)
Total Bilirubin: 0.5 mg/dL (ref 0.3–1.2)
Total Protein: 6 g/dL — ABNORMAL LOW (ref 6.5–8.1)

## 2020-07-18 LAB — CBC
HCT: 31.5 % — ABNORMAL LOW (ref 36.0–46.0)
Hemoglobin: 10.2 g/dL — ABNORMAL LOW (ref 12.0–15.0)
MCH: 25.2 pg — ABNORMAL LOW (ref 26.0–34.0)
MCHC: 32.4 g/dL (ref 30.0–36.0)
MCV: 77.8 fL — ABNORMAL LOW (ref 80.0–100.0)
Platelets: 269 10*3/uL (ref 150–400)
RBC: 4.05 MIL/uL (ref 3.87–5.11)
RDW: 14.6 % (ref 11.5–15.5)
WBC: 7 10*3/uL (ref 4.0–10.5)
nRBC: 0 % (ref 0.0–0.2)

## 2020-07-18 LAB — PROTEIN / CREATININE RATIO, URINE
Creatinine, Urine: 53.28 mg/dL
Total Protein, Urine: <6 mg/dL

## 2020-07-18 LAB — RESP PANEL BY RT-PCR (FLU A&B, COVID) ARPGX2
Influenza A by PCR: NEGATIVE
Influenza B by PCR: NEGATIVE
SARS Coronavirus 2 by RT PCR: NEGATIVE

## 2020-07-18 MED ORDER — SODIUM CHLORIDE (PF) 0.9 % IJ SOLN
INTRAMUSCULAR | Status: DC | PRN
  Administered 2020-07-18: 12 mL/h via EPIDURAL

## 2020-07-18 MED ORDER — PENICILLIN G POT IN DEXTROSE 60000 UNIT/ML IV SOLN
3.0000 10*6.[IU] | INTRAVENOUS | Status: DC
  Administered 2020-07-18 – 2020-07-19 (×2): 3 10*6.[IU] via INTRAVENOUS
  Filled 2020-07-18 (×2): qty 50

## 2020-07-18 MED ORDER — OXYCODONE-ACETAMINOPHEN 5-325 MG PO TABS
2.0000 | ORAL_TABLET | ORAL | Status: DC | PRN
Start: 2020-07-18 — End: 2020-07-19

## 2020-07-18 MED ORDER — SODIUM CHLORIDE 0.9 % IV SOLN
5.0000 10*6.[IU] | Freq: Once | INTRAVENOUS | Status: AC
  Administered 2020-07-18: 17:00:00 5 10*6.[IU] via INTRAVENOUS
  Filled 2020-07-18: qty 5

## 2020-07-18 MED ORDER — OXYCODONE-ACETAMINOPHEN 5-325 MG PO TABS: 1.0000 | ORAL_TABLET | ORAL | Status: DC | PRN

## 2020-07-18 MED ORDER — DIPHENHYDRAMINE HCL 50 MG/ML IJ SOLN: 12.5000 mg | INTRAMUSCULAR | Status: DC | PRN

## 2020-07-18 MED ORDER — ACETAMINOPHEN 325 MG PO TABS
650.0000 mg | ORAL_TABLET | ORAL | Status: DC | PRN
  Administered 2020-07-18: 650 mg via ORAL
  Filled 2020-07-18: qty 2

## 2020-07-18 MED ORDER — LACTATED RINGERS IV SOLN: 500.0000 mL | Freq: Once | INTRAVENOUS | Status: DC

## 2020-07-18 MED ORDER — LACTATED RINGERS IV SOLN: INTRAVENOUS | Status: DC

## 2020-07-18 MED ORDER — FENTANYL-BUPIVACAINE-NACL 0.5-0.125-0.9 MG/250ML-% EP SOLN
12.0000 mL/h | EPIDURAL | Status: DC | PRN
Start: 2020-07-18 — End: 2020-07-19
  Filled 2020-07-18: qty 250

## 2020-07-18 MED ORDER — SOD CITRATE-CITRIC ACID 500-334 MG/5ML PO SOLN: 30.0000 mL | ORAL | Status: DC | PRN

## 2020-07-18 MED ORDER — EPHEDRINE 5 MG/ML INJ: 10.0000 mg | INTRAVENOUS | Status: DC | PRN

## 2020-07-18 MED ORDER — OXYTOCIN BOLUS FROM INFUSION
333.0000 mL | Freq: Once | INTRAVENOUS | Status: AC
  Administered 2020-07-19: 02:00:00 333 mL via INTRAVENOUS

## 2020-07-18 MED ORDER — OXYTOCIN-SODIUM CHLORIDE 30-0.9 UT/500ML-% IV SOLN
2.5000 [IU]/h | INTRAVENOUS | Status: DC
  Administered 2020-07-19: 03:00:00 2.5 [IU]/h via INTRAVENOUS

## 2020-07-18 MED ORDER — PHENYLEPHRINE 40 MCG/ML (10ML) SYRINGE FOR IV PUSH (FOR BLOOD PRESSURE SUPPORT)
PREFILLED_SYRINGE | INTRAVENOUS | Status: AC
  Filled 2020-07-18: qty 10

## 2020-07-18 MED ORDER — PHENYLEPHRINE 40 MCG/ML (10ML) SYRINGE FOR IV PUSH (FOR BLOOD PRESSURE SUPPORT): 80.0000 ug | PREFILLED_SYRINGE | INTRAVENOUS | Status: DC | PRN

## 2020-07-18 MED ORDER — LACTATED RINGERS IV SOLN
500.0000 mL | INTRAVENOUS | Status: DC | PRN
Start: 2020-07-18 — End: 2020-07-19
  Administered 2020-07-18: 20:00:00 1000 mL via INTRAVENOUS

## 2020-07-18 MED ORDER — LIDOCAINE HCL (PF) 1 % IJ SOLN
INTRAMUSCULAR | Status: DC | PRN
  Administered 2020-07-18: 10 mL via EPIDURAL

## 2020-07-18 MED ORDER — TERBUTALINE SULFATE 1 MG/ML IJ SOLN: 0.2500 mg | Freq: Once | INTRAMUSCULAR | Status: DC | PRN

## 2020-07-18 MED ORDER — LIDOCAINE HCL (PF) 1 % IJ SOLN: 30.0000 mL | INTRAMUSCULAR | Status: DC | PRN

## 2020-07-18 MED ORDER — OXYTOCIN-SODIUM CHLORIDE 30-0.9 UT/500ML-% IV SOLN
1.0000 m[IU]/min | INTRAVENOUS | Status: DC
  Administered 2020-07-18: 18:00:00 1 m[IU]/min via INTRAVENOUS
  Filled 2020-07-18: qty 500

## 2020-07-18 MED ORDER — ONDANSETRON HCL 4 MG/2ML IJ SOLN: 4.0000 mg | Freq: Four times a day (QID) | INTRAMUSCULAR | Status: DC | PRN

## 2020-07-18 NOTE — Anesthesia Preprocedure Evaluation (Signed)
Anesthesia Evaluation  Patient identified by MRN, date of birth, ID band Patient awake    Reviewed: Allergy & Precautions, H&P , NPO status , Patient's Chart, lab work & pertinent test results  Airway Mallampati: II  TM Distance: >3 FB Neck ROM: Full    Dental no notable dental hx.    Pulmonary asthma ,    Pulmonary exam normal breath sounds clear to auscultation       Cardiovascular hypertension, Normal cardiovascular exam Rhythm:Regular Rate:Normal     Neuro/Psych PSYCHIATRIC DISORDERS Anxiety Depression negative neurological ROS     GI/Hepatic negative GI ROS, Neg liver ROS,   Endo/Other  negative endocrine ROS  Renal/GU negative Renal ROS  negative genitourinary   Musculoskeletal negative musculoskeletal ROS (+)   Abdominal   Peds negative pediatric ROS (+)  Hematology negative hematology ROS (+)   Anesthesia Other Findings   Reproductive/Obstetrics negative OB ROS                             Anesthesia Physical Anesthesia Plan  ASA: II  Anesthesia Plan: Epidural   Post-op Pain Management:    Induction:   PONV Risk Score and Plan:   Airway Management Planned:   Additional Equipment:   Intra-op Plan:   Post-operative Plan:   Informed Consent: I have reviewed the patients History and Physical, chart, labs and discussed the procedure including the risks, benefits and alternatives for the proposed anesthesia with the patient or authorized representative who has indicated his/her understanding and acceptance.       Plan Discussed with: Anesthesiologist  Anesthesia Plan Comments:         Anesthesia Quick Evaluation

## 2020-07-18 NOTE — MAU Note (Addendum)
MCKENZIE BOVE is a 29 y.o. at [redacted]w[redacted]d here in MAU reporting: vaginal bleeding since this AM. After she got discharged on Sunday the bleeding stopped and now it has started back. States it is similar in amount to the bleeding she had while she was here and was advised by the office to come in for evaluation. She is having some contractions but reports they are not regular. Denies LOF or recent IC. +FM. Pt also reporting a headache today.  Onset of complaint: today  Pain score: 7/10  Vitals:   07/18/20 1542  BP: 140/71  Pulse: (!) 107  Resp: 16  Temp: 98.4 F (36.9 C)  SpO2: 98%     FHT: +FM  Lab orders placed from triage: none

## 2020-07-18 NOTE — MAU Provider Note (Signed)
History     CSN: [id number]  Arrival date and time: 07/18/20 1527     Chief Complaint  Patient presents with  . Vaginal Bleeding  . Headache   HPI THis is a [id number] at [redacted]w[redacted]d. SHe was sent to the MAU by her OB's office due to complaints of spotting. She was admitted 2 days ago with bleeding and contractions. She was sent home the next day (yesterday) with improvement of bleeding and contractions. Today, she started having spotting again.  BP on discharge was 141/92.  Denies headache, blurred vision, abdominal pain.  Has occasional contractions.  OB History    Gravida  7   Para  3   Term  2   Preterm  0   AB  3   Living  2     SAB  2   IAB  0   Ectopic  1   Multiple  0   Live Births  2           Past Medical History:  Diagnosis Date  . Asthma   . Asthma   . Bacterial infection   . History of chicken pox   . Trichomonas   . Yeast infection     Past Surgical History:  Procedure Laterality Date  . DILATION AND CURETTAGE OF UTERUS    . DILATION AND CURETTAGE OF UTERUS N/A 09/17/2014   Procedure: DILATATION AND CURETTAGE;  Surgeon: Purcell Nails, MD;  Location: [facility];  Service: Gynecology;  Laterality: N/A;  . DILATION AND EVACUATION N/A 09/17/2014   Procedure: DILATATION AND EVACUATION;  Surgeon: Purcell Nails, MD;  Location: [facility];  Service: Gynecology;  Laterality: N/A;  . LAPAROSCOPY N/A 03/18/2017   Procedure: LAPAROSCOPY OPERATIVE WITH REMOVAL LEFT ECTOPIC PREGNANCY;  Surgeon: Silverio Lay, MD;  Location: [facility];  Service: Gynecology;  Laterality: N/A;  . ORIF ORBITAL FRACTURE Left 06/13/2016   Procedure: OPEN REDUCTION INTERNAL FIXATION (ORIF) ORBITAL FRACTURE;  Surgeon: Alena Bills Dillingham, DO;  Location: [facility];  Service: Plastics;  Laterality: Left;    Family History  Problem Relation Age of Onset  . Hypertension Maternal Uncle   . Hypertension Paternal Uncle   . Asthma Mother   . Healthy Father     Social History   Tobacco  Use  . Smoking status: Never Smoker  . Smokeless tobacco: Never Used  Vaping Use  . Vaping Use: Never used  Substance Use Topics  . Alcohol use: No  . Drug use: No    Allergies:  Allergies  Allergen Reactions  . Shellfish Allergy Hives    Medications Prior to Admission  Medication Sig Dispense Refill Last Dose  . acetaminophen (TYLENOL) 325 MG tablet Take 650 mg by mouth every 6 (six) hours as needed for mild pain or headache.     . Prenatal Vit-Fe Fumarate-FA (PRENATAL MULTIVITAMIN) TABS tablet Take 1 tablet by mouth daily at 12 noon.       Review of Systems Physical Exam   Blood pressure (!) 141/77, pulse 88, temperature 98.4 F (36.9 C), temperature source Oral, resp. rate 16, last menstrual period 10/02/2019, SpO2 99 %, unknown if currently breastfeeding.  Physical Exam Vitals reviewed.  Constitutional:      Appearance: She is well-developed.  Pulmonary:     Effort: Pulmonary effort is normal.  Abdominal:     Palpations: Abdomen is soft.  Musculoskeletal:        General: Normal range of motion.  Skin:    General: Skin  is warm and dry.  Neurological:     Mental Status: She is alert.  Psychiatric:        Mood and Affect: Mood normal.        Speech: Speech normal.        Behavior: Behavior normal.     MAU Course  Procedures NST reactive  MDM BPs elevated. At the least, qualifies for gestational hypertension. Possible marginal abruption.   Assessment and Plan  Discussed with Dr Normand Sloop, who agrees with admission and recommend delivery.  Levie Heritage 07/18/2020, 4:15 PM
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
## 2020-07-18 NOTE — Anesthesia Procedure Notes (Signed)
Epidural Patient location during procedure: OB Start time: 07/18/2020 8:47 PM End time: 07/18/2020 8:57 PM  Staffing Anesthesiologist: Mellody Dance, MD Performed: anesthesiologist   Preanesthetic Checklist Completed: patient identified, IV checked, site marked, risks and benefits discussed, monitors and equipment checked, pre-op evaluation and timeout performed  Epidural Patient position: sitting Prep: DuraPrep Patient monitoring: heart rate, cardiac monitor, continuous pulse ox and blood pressure Approach: midline Location: L2-L3 Injection technique: LOR saline  Needle:  Needle type: Tuohy  Needle gauge: 17 G Needle length: 9 cm Needle insertion depth: 7 cm Catheter type: closed end flexible Catheter size: 20 Guage Catheter at skin depth: 12 cm Test dose: negative and Other  Assessment Events: blood not aspirated, injection not painful, no injection resistance and negative IV test  Additional Notes Informed consent obtained prior to proceeding including risk of failure, 1% risk of PDPH, risk of minor discomfort and bruising.  Discussed rare but serious complications including epidural abscess, permanent nerve injury, epidural hematoma.  Discussed alternatives to epidural analgesia and patient desires to proceed.  Timeout performed pre-procedure verifying patient name, procedure, and platelet count.  Patient tolerated procedure well.

## 2020-07-19 ENCOUNTER — Encounter (HOSPITAL_COMMUNITY): Payer: Self-pay | Admitting: Obstetrics and Gynecology

## 2020-07-19 LAB — CBC
HCT: 35.2 % — ABNORMAL LOW (ref 36.0–46.0)
Hemoglobin: 10.7 g/dL — ABNORMAL LOW (ref 12.0–15.0)
MCH: 24.5 pg — ABNORMAL LOW (ref 26.0–34.0)
MCHC: 30.4 g/dL (ref 30.0–36.0)
MCV: 80.7 fL (ref 80.0–100.0)
Platelets: 271 10*3/uL (ref 150–400)
RBC: 4.36 MIL/uL (ref 3.87–5.11)
RDW: 14.8 % (ref 11.5–15.5)
WBC: 10.1 10*3/uL (ref 4.0–10.5)
nRBC: 0 % (ref 0.0–0.2)

## 2020-07-19 LAB — RPR: RPR Ser Ql: NONREACTIVE

## 2020-07-19 MED ORDER — ONDANSETRON HCL 4 MG PO TABS: 4.0000 mg | ORAL_TABLET | ORAL | Status: DC | PRN

## 2020-07-19 MED ORDER — COCONUT OIL OIL: 1.0000 "application " | TOPICAL_OIL | Status: DC | PRN

## 2020-07-19 MED ORDER — ACETAMINOPHEN 325 MG PO TABS
650.0000 mg | ORAL_TABLET | Freq: Three times a day (TID) | ORAL | Status: DC
  Administered 2020-07-19 – 2020-07-20 (×6): 650 mg via ORAL
  Filled 2020-07-19 (×7): qty 2

## 2020-07-19 MED ORDER — SENNOSIDES-DOCUSATE SODIUM 8.6-50 MG PO TABS
2.0000 | ORAL_TABLET | ORAL | Status: DC
  Administered 2020-07-19 – 2020-07-21 (×3): 2 via ORAL
  Filled 2020-07-19 (×3): qty 2

## 2020-07-19 MED ORDER — SIMETHICONE 80 MG PO CHEW
80.0000 mg | CHEWABLE_TABLET | ORAL | Status: DC | PRN
Start: 2020-07-19 — End: 2020-07-21

## 2020-07-19 MED ORDER — WITCH HAZEL-GLYCERIN EX PADS: 1.0000 "application " | MEDICATED_PAD | CUTANEOUS | Status: DC | PRN

## 2020-07-19 MED ORDER — BENZOCAINE-MENTHOL 20-0.5 % EX AERO
1.0000 | INHALATION_SPRAY | CUTANEOUS | Status: DC | PRN
Start: 2020-07-19 — End: 2020-07-21

## 2020-07-19 MED ORDER — DIPHENHYDRAMINE HCL 25 MG PO CAPS: 25.0000 mg | ORAL_CAPSULE | Freq: Four times a day (QID) | ORAL | Status: DC | PRN

## 2020-07-19 MED ORDER — DIBUCAINE (PERIANAL) 1 % EX OINT: 1.0000 "application " | TOPICAL_OINTMENT | CUTANEOUS | Status: DC | PRN

## 2020-07-19 MED ORDER — ONDANSETRON HCL 4 MG/2ML IJ SOLN: 4.0000 mg | INTRAMUSCULAR | Status: DC | PRN

## 2020-07-19 MED ORDER — TETANUS-DIPHTH-ACELL PERTUSSIS 5-2.5-18.5 LF-MCG/0.5 IM SUSY: 0.5000 mL | PREFILLED_SYRINGE | Freq: Once | INTRAMUSCULAR | Status: DC

## 2020-07-19 MED ORDER — IBUPROFEN 800 MG PO TABS
800.0000 mg | ORAL_TABLET | Freq: Three times a day (TID) | ORAL | Status: DC
  Administered 2020-07-19 – 2020-07-20 (×6): 800 mg via ORAL
  Filled 2020-07-19 (×7): qty 1

## 2020-07-19 MED ORDER — PRENATAL MULTIVITAMIN CH
1.0000 | ORAL_TABLET | Freq: Every day | ORAL | Status: DC
  Administered 2020-07-19 – 2020-07-21 (×3): 1 via ORAL
  Filled 2020-07-19 (×3): qty 1

## 2020-07-19 NOTE — Lactation Note (Signed)
This note was copied from a baby's chart. Lactation Consultation Note  Patient Name: Kristin Lyons [id number] Date: 07/19/2020 Reason for consult: Follow-up assessment  P3 mother whose infant is now 71 hours old.  This is an ETI at 38+2 weeks.  Previous LC requested I follow up with latch assist as needed.  Baby was asleep STS on father's chest when I arrived.  Reviewed nipple soreness and care with mother.  Removed her comfort gels from the refrigerator and mother placed her gel pads on sore nipple.  Suggested she always use her EBM to rub into nipple/areola after every feeding.  Mother has shells and a manual pump from the earlier [facility] visit today.  She will call for further questions/concerns or a latch assist if needed when baby is ready to feed again.   Maternal Data    Feeding Feeding Type: Bottle Fed - Formula  LATCH Score                   Interventions Interventions: Breast feeding basics reviewed;Shells;Comfort gels;Hand pump  Lactation Tools Discussed/Used Tools: Pump;Flanges;Shells;Comfort gels Flange Size: 24;27 Shell Type: Inverted Breast pump type: Manual Pump Review: Setup, frequency, and cleaning Initiated by:: MAI Date initiated:: 07/19/20   Consult Status Consult Status: Follow-up Date: 07/20/20 Follow-up type: In-patient    Dora Sims 07/19/2020, 6:14 PM

## 2020-07-19 NOTE — Lactation Note (Signed)
This note was copied from a baby's chart. Lactation Consultation Note Baby 4 hrs old. Mom wanted assistance latching when LC came into room. Mom has everted nipples. Hand expression taught w/colostrum noted.  Newborn feeding habits, behavior, STS, I&O, breast massage, support, props, safety, supply and demand discussed. Mom encouraged to feed baby 8-12 times/24 hours and with feeding cues.   Mom has 29 yr old and 2 yr old. Mom didn't BF them because she worked. Mom is staying home now so she wanted to BF.  Baby latched well. Needed chin tug. No swallows heard at this time. Encouraged mom to call for assistance or questions.  Lactation brochure given.  Patient Name: Kristin Lyons [id number] Date: 07/19/2020 Reason for consult: Initial assessment;1st time breastfeeding;Early term 37-38.6wks   Maternal Data Has patient been taught Hand Expression?: Yes Does the patient have breastfeeding experience prior to this delivery?: No  Feeding Feeding Type: Breast Fed  LATCH Score Latch: Repeated attempts needed to sustain latch, nipple held in mouth throughout feeding, stimulation needed to elicit sucking reflex.  Audible Swallowing: None  Type of Nipple: Everted at rest and after stimulation  Comfort (Breast/Nipple): Soft / non-tender  Hold (Positioning): Assistance needed to correctly position infant at breast and maintain latch.  LATCH Score: 6  Interventions Interventions: Breast feeding basics reviewed;Assisted with latch;Breast compression;Skin to skin;Adjust position;Breast massage;Support pillows;Hand express;Position options  Lactation Tools Discussed/Used WIC Program: Yes   Consult Status Consult Status: Follow-up Date: 07/19/20 (in pm) Follow-up type: In-patient    Charyl Dancer 07/19/2020, 6:29 AM

## 2020-07-19 NOTE — Lactation Note (Addendum)
This note was copied from a baby's chart. Lactation Consultation Note 0237 when [facility] visited mom in L&D. Mom holding baby STS. Asked if LC can assist in latching mom stated yes.  Mom has large everted nipples. Baby tolerated well. Not able to obtain any areola d/t size of nipple. Encouraged to keep baby close to obtain deep latch. Suckling off and on breast. Hand expression colostrum. Praised mom. Will f/u on MBU.  Patient Name: Girl Yanique Mulvihill [id number] Date: 07/19/2020 Reason for consult: Initial assessment;Early term 37-38.6wks   Maternal Data    Feeding Feeding Type: Breast Fed  LATCH Score Latch: Repeated attempts needed to sustain latch, nipple held in mouth throughout feeding, stimulation needed to elicit sucking reflex.  Audible Swallowing: None  Type of Nipple: Everted at rest and after stimulation  Comfort (Breast/Nipple): Soft / non-tender  Hold (Positioning): Assistance needed to correctly position infant at breast and maintain latch.  LATCH Score: 6  Interventions Interventions: Assisted with latch;Breast compression;Skin to skin;Adjust position;Breast massage;Hand express  Lactation Tools Discussed/Used     Consult Status Consult Status: Follow-up Date: 07/19/20 Follow-up type: In-patient    Charyl Dancer 07/19/2020, 2:54 AM

## 2020-07-19 NOTE — Lactation Note (Signed)
This note was copied from a baby's chart. Lactation Consultation Note  Patient Name: Girl Marcell Pfeifer ASTMH'D Date: 07/19/2020 Reason for consult: Follow-up assessment;Early term 37-38.6wks;Other (Comment);Nipple pain/trauma (baby 13  hours old and last fed at 1430 - 5 ml formula, per mom due to soreness . Mom aware to call for latch assessmen with feeding cues)  Baby is 13 hours old .  LC offered to assess breast tissue due to soreness and LC  Noted the areola to be compressible , but edema on both sides of the nipple , no breakdown.  LC provided and instructed mom on the use shells , hand pump with #24 F and #27 F and comfort gels x 6 days.    Maternal Data    Feeding Feeding Type: Bottle Fed - Formula  LATCH Score                   Interventions Interventions: Breast feeding basics reviewed;Shells;Comfort gels;Hand pump  Lactation Tools Discussed/Used Tools: Pump;Flanges;Shells;Comfort gels Flange Size: 24;27 Shell Type: Inverted Breast pump type: Manual Pump Review: Setup, frequency, and cleaning Initiated by:: MAI Date initiated:: 07/19/20   Consult Status Consult Status: Follow-up Date: 07/19/20 Follow-up type: In-patient    Matilde Sprang Ophelia Sipe 07/19/2020, 3:22 PM

## 2020-07-19 NOTE — Progress Notes (Signed)
Subjective:    Comfortable w/ epidural. Discussed amniotomy and pt agrees.   Objective:    VS: BP 113/61   Pulse 80   Temp 98.2 F (36.8 C) (Oral)   Resp 16   LMP 10/02/2019   SpO2 99%  FHR : baseline 130 / variability moderate / accelerations present / absent decelerations Toco: contractions every 2-4 minutes  Membranes: AROM @ 0025, clear Dilation: 6 Effacement (%): 80 Cervical Position: Posterior Station: -2 Presentation: Vertex Exam by:: Rhea Pink, CNM Pitocin 8 mU/min  Assessment/Plan:   29 y.o. [id number] [redacted]w[redacted]d  GHTN Labor: Progressing on Pitocin, amniotomy performed.  Preeclampsia:  labs stable protein creatinine ration too low to calculate Fetal Wellbeing:  Category I Pain Control:  Epidural I/D:  GBS pos PCN x2 doses Anticipated MOD:  NSVD  Roma Schanz MSN, CNM 07/19/2020 12:29 AM

## 2020-07-19 NOTE — Anesthesia Postprocedure Evaluation (Signed)
Anesthesia Post Note  Patient: Kristin Lyons  Procedure(s) Performed: AN AD HOC LABOR EPIDURAL     Patient location during evaluation: Mother Baby Anesthesia Type: Epidural Level of consciousness: awake and alert Pain management: pain level controlled Vital Signs Assessment: post-procedure vital signs reviewed and stable Respiratory status: spontaneous breathing, nonlabored ventilation and respiratory function stable Cardiovascular status: stable Postop Assessment: no headache, no backache and epidural receding Anesthetic complications: no   No complications documented.  Last Vitals:  Vitals:   07/19/20 0410 07/19/20 0520  BP: 137/65 130/73  Pulse: 91 91  Resp: 16 16  Temp: 36.9 C 36.8 C  SpO2: 100% 100%    Last Pain:  Vitals:   07/19/20 0520  TempSrc: Oral  PainSc: 0-No pain   Pain Goal:                   Sadat Sliwa

## 2020-07-20 MED ORDER — NIFEDIPINE ER OSMOTIC RELEASE 30 MG PO TB24
30.0000 mg | ORAL_TABLET | Freq: Every day | ORAL | Status: DC
  Administered 2020-07-20: 19:00:00 30 mg via ORAL
  Filled 2020-07-20 (×2): qty 1

## 2020-07-20 NOTE — Progress Notes (Signed)
Subjective: Postpartum Day # 1 : Pt admitted on 12/20 for IOL for GHT, PCR was undetected, CBC, cmp WNL, asymptomatic, Slight HA on PPD#1 resolved with medication, BP elevated this morning 155/76 with 15 min repeat of 137/81. Currenlty S/P NSVD on 12/21, who progressed with AROM and pitocin over an intact perinium with EBL of and hgb drop of 10.2-10.7. H/O depression and anxiety, no meds, mood stable. Patient up ad lib, denies syncope or dizziness. Reports consuming regular diet without issues and denies N/V. Patient reports 0 bowel movement + passing flatus.  Denies issues with urination and reports bleeding is "lighter."  Patient is breastfeeding and reports going well.  Desires undecided for postpartum contraception.  Pain is being appropriately managed with use of po meds.    No laceration Feeding:  Breast Contraceptive plan:  undecided Baby female.   Objective: Vital signs in last 24 hours: Patient Vitals for the past 24 hrs:  BP Temp Temp src Pulse Resp SpO2  07/20/20 0921 137/81 -- -- 91 -- --  07/20/20 0909 (!) 155/76 98.4 F (36.9 C) Oral 98 18 100 %  07/20/20 0540 116/61 98.5 F (36.9 C) Oral 86 16 99 %  07/19/20 2024 -- 98 F (36.7 C) -- 79 15 100 %     Physical Exam:  General: alert, cooperative, appears stated age and no distress Mood/Affect: Happy Lungs: clear to auscultation, no wheezes, rales or rhonchi, symmetric air entry.  Heart: normal rate, regular rhythm, normal S1, S2, no murmurs, rubs, clicks or gallops. Breast: breasts appear normal, no suspicious masses, no skin or nipple changes or axillary nodes. Abdomen:  + bowel sounds, soft, non-tender GU: perineum intact, healing well. No signs of external hematomas.  Uterine Fundus: firm Lochia: appropriate Skin: Warm, Dry. DVT Evaluation: No evidence of DVT seen on physical exam. Negative Homan's sign. No cords or calf tenderness. No significant calf/ankle edema.  CBC Latest Ref Rng & Units 07/19/2020  07/18/2020 07/16/2020  WBC 4.0 - 10.5 K/uL 10.1 7.0 8.3  Hemoglobin 12.0 - 15.0 g/dL 10.7(L) 10.2(L) 10.6(L)  Hematocrit 36.0 - 46.0 % 35.2(L) 31.5(L) 34.3(L)  Platelets 150 - 400 K/uL 271 269 279    No results found for this or any previous visit (from the past 24 hour(s)).   CBG (last 3)  No results for input(s): GLUCAP in the last 72 hours.   I/O last 3 completed shifts: In: -  Out: 850 [Urine:800; Blood:50]   Assessment Postpartum Day # 1 : Pt admitted on 12/20 for IOL for GHT, PCR was undetected, CBC, cmp WNL, asymptomatic, Slight HA on PPD#1 resolved with medication, BP elevated this morning 155/76 with 15 min repeat of 137/81. Currenlty S/P NSVD on 12/21, who progressed with AROM and pitocin over an intact perinium with EBL of and hgb drop of 10.2-10.7. H/O depression and anxiety, no meds, mood stable. Pt stable. -1 involution. breastfeeding. Hemodynamically stable.   Plan: Continue other mgmt as ordered VTE prophylactics: Early ambulated as tolerates.  Pain control: Motrin/Tylenol PRN Education given regarding options for contraception, including barrier methods, injectable contraception, IUD placement, oral contraceptives.  Plan for discharge tomorrow, Breastfeeding, Lactation consult and Social Work consult   Dr. Normand Sloop to be updated on patient status  [facility] NP-C, CNM 07/20/2020, 1:58 PM

## 2020-07-20 NOTE — Social Work (Signed)
CSW received consult for hx of Anxiety and Depression.  CSW met with MOB to offer support and complete assessment.     CSW introduced self and role. CSW observed MOB holding newborn Eustace Moore and feeding her a bottle. CSW informed MOB of the reason for consult. MOB expressed understanding and reported the diagnosis of anxiety and depression is old. MOB stated she was diagnosed in 2019 and was prescribed medication that she never took. MOB was unable to recall the medication name. MOB stated she has never been to therapy and stated she did not experience any symptoms of anxiety or depression during the pregnancy. MOB reported she overall had a good pregnancy and is currently feeling well. MOB was observed smiling and soothing baby. MOB identified her mother as a support and stated overall she has a good support system. MOB denies any current SI, HI or being involved in a DV relationship.  CSW provided education regarding the baby blues period vs. perinatal mood disorders, discussed treatment and gave resources for mental health follow up if concerns arise.  CSW recommends self-evaluation during the postpartum time period using the New Mom Checklist from Postpartum Progress and encouraged MOB to contact a medical professional if symptoms are noted at any time.   CSW provided review of Sudden Infant Death Syndrome (SIDS) precautions. MOB reported baby will sleep in a bassinet. MOB identified [facility] for follow-up care and denies any transportation barriers. MOB stated she has all of the essential needs for baby, including a car seat. MOB declined any additional community referrals or resources.     CSW identifies no further need for intervention and no barriers to discharge at this time.  Darra Lis, [place] [employer] [phone]

## 2020-07-20 NOTE — Lactation Note (Signed)
This note was copied from a baby's chart. Lactation Consultation Note  Patient Name: Kristin Lyons [id number] Date: 07/20/2020   P3, 37 hour ETI female infant with -2% weight loss. Infant is being breast and formula feed this is mom's choice. Per mom, she is tired and her BP has been elevated, she has not latched infant to breast today. Infant has been receiving "formula only. " Per mom, she would  prefer if LC services to come back later today and help assist her with latching infant at the breast. Mom currently plans to take a shower and rest. [facility] written name on board and mom knows to call [facility] services for assistance  when she is ready to latch infant at the breast.   Maternal Data    Feeding    LATCH Score                   Interventions    Lactation Tools Discussed/Used     Consult Status      Danelle Earthly 07/20/2020, 3:29 PM

## 2020-07-21 MED ORDER — NIFEDIPINE ER OSMOTIC RELEASE 30 MG PO TB24: 30.0000 mg | ORAL_TABLET | Freq: Every day | ORAL | 2 refills | Status: DC

## 2020-07-21 MED ORDER — IBUPROFEN 800 MG PO TABS: 800.0000 mg | ORAL_TABLET | Freq: Three times a day (TID) | ORAL | 0 refills | Status: DC

## 2020-07-21 MED ORDER — ADULT BLOOD PRESSURE CUFF LG KIT: PACK | 0 refills | Status: AC

## 2020-07-21 NOTE — Lactation Note (Signed)
This note was copied from a baby's chart. Lactation Consultation Note  Patient Name: Kristin Lyons WGNFA'O Date: 07/21/2020 Reason for consult: Follow-up assessment;Early term 37-38.6wks;Other (Comment) (per mom has a DEBP at home and plans to probably just pump and bottle feed. LC reviewed supply and demand/ 8-10 times both breast in 24 hours to establish milk supply.)  Baby is 57 hours old. Has been receiving bottles.  Per mom has a DEBP Motiff at home.  Sore nipple and engorgement prevention and tx reviewed.  Mom also has the hand pump ( #20 and #24 F ) and shells.  LC provided the [facility] brochure with resource phone numbers   LC recommended if moms changes her mind to re-latch to consider calling for  [facility] O/P appt.     Maternal Data    Feeding Feeding Type:  (baby recently fed) Nipple Type: Slow - flow  LATCH Score                   Interventions Interventions: Breast feeding basics reviewed;Assisted with latch;Skin to skin;Breast massage;Hand express;Shells;Hand pump  Lactation Tools Discussed/Used Tools: Pump;Shells;Flanges Flange Size: 24;27 Shell Type: Inverted Breast pump type: Manual Pump Review: Milk Storage   Consult Status Consult Status: Complete Date: 07/21/20    Kathrin Greathouse 07/21/2020, 11:23 AM
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
## 2020-07-21 NOTE — Discharge Instructions (Signed)

## 2020-12-19 DIAGNOSIS — Z8759 Personal history of other complications of pregnancy, childbirth and the puerperium: Secondary | ICD-10-CM | POA: Insufficient documentation

## 2020-12-25 ENCOUNTER — Encounter (HOSPITAL_COMMUNITY): Payer: Self-pay | Admitting: Obstetrics and Gynecology

## 2020-12-25 ENCOUNTER — Other Ambulatory Visit: Payer: Self-pay

## 2020-12-25 ENCOUNTER — Inpatient Hospital Stay (HOSPITAL_COMMUNITY)
Admission: AD | Admit: 2020-12-25 | Discharge: 2020-12-25 | Disposition: A | Discharge status: Home / Self Care | Payer: BC Managed Care – PPO | Source: Ambulatory Visit | Attending: Obstetrics and Gynecology | Admitting: Obstetrics and Gynecology

## 2020-12-25 DIAGNOSIS — Z6740 Type O blood, Rh positive: Secondary | ICD-10-CM | POA: Insufficient documentation

## 2020-12-25 DIAGNOSIS — O00202 Left ovarian pregnancy without intrauterine pregnancy: Secondary | ICD-10-CM | POA: Insufficient documentation

## 2020-12-25 DIAGNOSIS — Z79899 Other long term (current) drug therapy: Secondary | ICD-10-CM | POA: Insufficient documentation

## 2020-12-25 LAB — CBC WITH DIFFERENTIAL/PLATELET
Abs Immature Granulocytes: 0.01 10*3/uL (ref 0.00–0.07)
Basophils Absolute: 0 10*3/uL (ref 0.0–0.1)
Basophils Relative: 1 %
Eosinophils Absolute: 0.1 10*3/uL (ref 0.0–0.5)
Eosinophils Relative: 2 %
HCT: 36.6 % (ref 36.0–46.0)
Hemoglobin: 11.9 g/dL — ABNORMAL LOW (ref 12.0–15.0)
Immature Granulocytes: 0 %
Lymphocytes Relative: 36 %
Lymphs Abs: 2.2 10*3/uL (ref 0.7–4.0)
MCH: 27 pg (ref 26.0–34.0)
MCHC: 32.5 g/dL (ref 30.0–36.0)
MCV: 83 fL (ref 80.0–100.0)
Monocytes Absolute: 0.3 10*3/uL (ref 0.1–1.0)
Monocytes Relative: 5 %
Neutro Abs: 3.4 10*3/uL (ref 1.7–7.7)
Neutrophils Relative %: 56 %
Platelets: 256 10*3/uL (ref 150–400)
RBC: 4.41 MIL/uL (ref 3.87–5.11)
RDW: 14 % (ref 11.5–15.5)
WBC: 6.2 10*3/uL (ref 4.0–10.5)
nRBC: 0 % (ref 0.0–0.2)

## 2020-12-25 LAB — COMPREHENSIVE METABOLIC PANEL
ALT: 16 U/L (ref 0–44)
AST: 18 U/L (ref 15–41)
Albumin: 3.8 g/dL (ref 3.5–5.0)
Alkaline Phosphatase: 65 U/L (ref 38–126)
Anion gap: 7 (ref 5–15)
BUN: 5 mg/dL — ABNORMAL LOW (ref 6–20)
CO2: 27 mmol/L (ref 22–32)
Calcium: 9.1 mg/dL (ref 8.9–10.3)
Chloride: 104 mmol/L (ref 98–111)
Creatinine, Ser: 0.74 mg/dL (ref 0.44–1.00)
GFR, Estimated: >60 mL/min (ref >60)
Glucose, Bld: 103 mg/dL — ABNORMAL HIGH (ref 70–99)
Potassium: 3.3 mmol/L — ABNORMAL LOW (ref 3.5–5.1)
Sodium: 138 mmol/L (ref 135–145)
Total Bilirubin: 0.5 mg/dL (ref 0.3–1.2)
Total Protein: 6.9 g/dL (ref 6.5–8.1)

## 2020-12-25 LAB — HCG, QUANTITATIVE, PREGNANCY: hCG, Beta Chain, Quant, S: 9639 m[IU]/mL — ABNORMAL HIGH (ref <5)

## 2020-12-25 MED ORDER — METHOTREXATE FOR ECTOPIC PREGNANCY
50.0000 mg/m2 | Freq: Once | INTRAMUSCULAR | Status: AC
  Administered 2020-12-25: 103.5 mg via INTRAMUSCULAR
  Filled 2020-12-25: qty 4.14

## 2020-12-25 NOTE — MAU Note (Signed)
Spoke with [facility] CNM regarding patient arrival. CNM states she will come see patient and will enter lab orders and MTX orders.

## 2020-12-25 NOTE — Discharge Instructions (Signed)
Methotrexate Treatment for an Ectopic Pregnancy Methotrexate is a medicine that treats an ectopic pregnancy. In this type of pregnancy, the fertilized egg attaches (implants) outside the uterus. An ectopic pregnancy cannot develop into a healthy baby. Methotrexate works by stopping the growth of the fertilized egg. It also helps the body absorb tissue from the egg. This takes about 2-6 weeks. An ectopic pregnancy can be life-threatening. However, most ectopic pregnancies can be successfully treated with methotrexate if they are diagnosed early. Tell a health care provider about:  Any allergies you have.  All medicines you are taking, including vitamins, herbs, eye drops, creams, and over-the-counter medicines.  Any medical conditions you have. What are the risks? Generally, this is a safe treatment. However, problems may occur, including:  Digestive problems. You may have: ? Nausea. ? Vomiting. ? Diarrhea. ? Cramping in your abdomen.  Bleeding or spotting from your vagina.  Feeling dizzy or light-headed.  Mouth sores.  Inflammation of the lining of your lungs (pneumonitis).  Damage to nearby structures or organs, such as damage to the liver.  Hair loss. There is a risk that methotrexate treatment will fail and the pregnancy will continue. There is also a risk that the ectopic pregnancy might tear or burst (rupture) during use of this medicine. What happens before the procedure?  Blood tests will be done to check how your disease-fighting system (immune system), liver, and kidneys are working.  You will also have blood tests to measure your pregnancy hormone levels and to find out your blood type.  You will be given a shot of a medicine called Rho(D) immune globulin if: ? You are Rh-negative and the father is Rh-positive. ? You are Rh-negative and the father's Rh type is unknown. What happens during the procedure?  Methotrexate will be injected into your  muscle. ? Methotrexate may be given as a single dose of medicine or a series of doses over time, depending on your response to the treatment. ? Methotrexate injections are given by a health care provider. Injection is the most common way that this medicine is used to treat an ectopic pregnancy.  You may also receive other medicines to manage your ectopic pregnancy. The procedure may vary among health care providers and hospitals. What can I expect after treatment? After your treatment, it is common to have:  Cramping in your abdomen.  Bleeding in your vagina.  Tiredness (fatigue).  Nausea.  Vomiting.  Diarrhea. Blood tests will be done at timed intervals for several days or weeks to check your pregnancy hormone levels. The blood tests will be done until the pregnancy hormone can no longer be found in the blood. If the methotrexate treatment does not work, a surgical procedure may be done to remove the ectopic pregnancy. Follow these instructions at home: Medicines  Take over-the-counter and prescription medicines only as told by your health care provider.  Do not take prescription pain medicines, aspirin, ibuprofen, naproxen, or any other NSAIDs.  Do not take folic acid, prenatal vitamins, or other vitamins that contain folic acid. Activity  Do not have sex, douche, or put anything, such as tampons, in your vagina until your health care provider says it is okay.  Limit activities that take a lot of effort as told by your health care provider. General instructions  Do not drink alcohol.  Follow instructions from your health care provider about eating restrictions, such as avoiding foods that produce a lot of gas. These foods can hide the signs of a   ruptured ectopic pregnancy.  Limit exposure to sunlight or artificial UV light such as from tanning beds. Methotrexate can make you more sensitive to the sun.  Follow instructions from your health care provider on how and when to  report any symptoms that may indicate a ruptured ectopic pregnancy.  Keep all follow-up visits. This is important.   Contact a health care provider if:  You have persistent nausea and vomiting.  You have persistent diarrhea.  You are having a reaction to the medicine. This may include: ? Unusual fatigue. ? Skin rash. Get help right away if:  Pain in your abdomen or in the area between your hip bones (pelvic area) gets worse.  You have more bleeding from your vagina.  You feel light-headed or you faint.  You are short of breath.  Your heart rate increases.  You develop a cough.  You have chills or a fever. Summary  Methotrexate is a medicine that treats an ectopic pregnancy. This type of pregnancy forms outside the uterus.  There is a risk that methotrexate treatment will fail and the pregnancy will continue. There is also a risk that the ectopic pregnancy might tear or burst during use of this medicine.  This medicine may be given in a single dose or a series of doses over time.  After your treatment, blood tests will be done at timed intervals for several days or weeks to check your pregnancy hormone levels. The blood tests will be done until no more pregnancy hormone is found in the blood. This information is not intended to replace advice given to you by your health care provider. Make sure you discuss any questions you have with your health care provider. Document Revised: 12/30/2019 Document Reviewed: 12/30/2019 Elsevier Patient Education  2021 Elsevier Inc.   Ectopic Pregnancy  An ectopic pregnancy happens when a fertilized egg attaches (implants) outside the uterus. In a normal pregnancy, a fertilized egg implants in the uterus. An ectopic pregnancy cannot develop into a healthy baby. Most ectopic pregnancies occur in one of the fallopian tubes, which is where an egg travels from an ovary to get to the uterus. This is called a tubal pregnancy. An ectopic pregnancy  can also happen on an ovary, on the cervix, or in the abdomen. When a fertilized egg implants on tissue outside the uterus and begins to grow, it may cause the tissue to tear or burst. This is known as a ruptured ectopic pregnancy. The tear or burst causes internal bleeding. This may cause intense pain in the abdomen. An ectopic pregnancy is a medical emergency and can be life-threatening. What are the causes? The most common cause of this condition is damage to one of the fallopian tubes. A fallopian tube may be narrowed or blocked, and that stops the fertilized egg from reaching the uterus. Sometimes, the cause of this condition is not known. What increases the risk? The following factors may make you more likely to develop this condition:  Having gone through infertility treatment before.  Having had an ectopic pregnancy before.  Having had surgery to have the fallopian tubes tied.  Becoming pregnant while using an intrauterine device for birth control.  Taking birth control pills before the age of 16. Other risk factors include:  Smoking.  Alcohol use.  History of DES exposure. DES is a medicine that was used until 1971 and affected babies whose mothers took the medicine. What are the signs or symptoms? Common symptoms of this condition include:  Missing  a menstrual period.  Nausea or tiredness.  Tender breasts.  Other normal pregnancy symptoms. Other symptoms may include:  Pain during sex.  Vaginal bleeding or spotting.  Cramping or pain in the lower abdomen.  A fast heartbeat, low blood pressure, and sweating.  Pain or increased pressure while having a bowel movement. Symptoms of a ruptured ectopic pregnancy and internal bleeding may include:  Sudden, severe pain in the abdomen.  Dizziness, weakness, feeling light-headed, or fainting.  Pain in the shoulder or neck area. How is this diagnosed? This condition is diagnosed by:  A blood test to check for the  pregnancy hormone.  A pelvic exam to find painful areas or a mass in the abdomen.  Ultrasound. A probe is inserted into the vagina to see if there is a pregnancy in or outside the uterus.  Taking a sample of tissue from the uterus.  Surgery to look closely at the fallopian tubes through an incision in the abdomen. How is this treated? This condition is usually treated with medicine or surgery. Sometimes, ectopic pregnancies can resolve on their own, under close monitoring by your health care provider. Medicine A medicine called methotrexate may be given to cause the pregnancy tissue to be absorbed. The medicine may be given if:  The diagnosis is made early, with no signs of active bleeding.  The fallopian tube has not torn or burst. You will need blood tests to make sure the medicine is working. It may take 4-6 weeks for the pregnancy tissues to be absorbed. Surgery Surgery may be performed to:  Remove the pregnancy tissue.  Stop internal bleeding.  Remove part or all of the fallopian tube.  Remove the uterus. This is rare. After surgery, you may need to have blood tests to make sure the surgery worked. Follow these instructions at home: Medicines  Take over-the-counter and prescription medicines only as told by your health care provider.  Ask your health care provider if the medicine prescribed to you: ? Requires you to avoid driving or using machinery. ? Can cause constipation. You may need to take these actions to prevent or treat constipation:  Drink enough fluid to keep your urine pale yellow.  Take over-the-counter or prescription medicines.  Eat foods that are high in fiber, such as beans, whole grains, and fresh fruits and vegetables.  Limit foods that are high in fat and processed sugars, such as fried or sweet foods. General instructions  Rest or limit your activity, if told by your health care provider.  Do not have sex or put anything in your vagina, such  as tampons or douches, for 6 weeks or until your health care provider says it is safe.  Do not lift anything that is heavier than 10 lb (4.5 kg), or the limit that you are told, until your health care provider says that it is safe.  Return to your normal activities as told by your health care provider. Ask your health care provider what activities are safe for you.  Keep all follow-up visits. This is important. Contact a health care provider if:  You have a fever or chills.  You have nausea and vomiting. Get help right away if:  Your pain gets worse or is not relieved by medicine.  You feel dizzy or weak.  You feel light-headed or you faint.  You have sudden, severe pain in your abdomen.  You have sudden pain in the shoulder or neck area. Summary  An ectopic pregnancy happens when a  fertilized egg implants outside the uterus. Most ectopic pregnancies occur in one of the fallopian tubes.  An ectopic pregnancy is a medical emergency and can be life-threatening.  The most common cause of this condition is damage to one of the fallopian tubes.  This condition is usually treated with medicine or surgery. Some ectopic pregnancies resolve on their own, under close monitoring by your health care provider. This information is not intended to replace advice given to you by your health care provider. Make sure you discuss any questions you have with your health care provider. Document Revised: 10/27/2019 Document Reviewed: 10/27/2019 Elsevier Patient Education  2021 [employer] Inc.  

## 2020-12-25 NOTE — MAU Provider Note (Signed)
Chief Complaint: Injections   SUBJECTIVE HPI: Kristin Lyons is a 30 y.o. [phone] at Unknown who presents to MAU Left ovary ectropic pregnancy, presented for beta hcg, cmp, cbc, blood type O+, denies pain, endorses having light spotting. Pt denies sob, cp. POC developed questions answered.     Past Medical History:  Diagnosis Date  . Asthma   . Asthma   . Bacterial infection   . History of chicken pox   . Trichomonas   . Yeast infection    OB History  Gravida Para Term Preterm AB Living  8 4 3  0 3 3  SAB IAB Ectopic Multiple Live Births  2 0 1 0 3    # Outcome Date GA Lbr Len/2nd Weight Sex Delivery Anes PTL Lv  8 Current           7 Term 07/19/20 [redacted]w[redacted]d / 00:31 2931 g F Vag-Spont EPI  LIV     Birth Comments: wnl  6 Term 12/09/17 [redacted]w[redacted]d 01:40 / 00:14 2736 g M Vag-Spont EPI  LIV  5 Para 09/17/14 [redacted]w[redacted]d 00:20 / 00:02 230 g F Vag-Spont None  FD  4 Term 07/28/11 [redacted]w[redacted]d 14:56 / 00:53 2555 g M Vag-Spont EPI  LIV  3 Ectopic           2 SAB           1 SAB            Past Surgical History:  Procedure Laterality Date  . DILATION AND CURETTAGE OF UTERUS    . DILATION AND CURETTAGE OF UTERUS N/A 09/17/2014   Procedure: DILATATION AND CURETTAGE;  Surgeon: 09/19/2014, MD;  [facility]: [facility];  Service: Gynecology;  Laterality: N/A;  . DILATION AND EVACUATION N/A 09/17/2014   Procedure: DILATATION AND EVACUATION;  Surgeon: 09/19/2014, MD;  Location: [facility];  Service: Gynecology;  Laterality: N/A;  . LAPAROSCOPY N/A 03/18/2017   Procedure: LAPAROSCOPY OPERATIVE WITH REMOVAL LEFT ECTOPIC PREGNANCY;  Surgeon: 03/20/2017, MD;  Location: [facility];  Service: Gynecology;  Laterality: N/A;  . ORIF ORBITAL FRACTURE Left 06/13/2016   Procedure: OPEN REDUCTION INTERNAL FIXATION (ORIF) ORBITAL FRACTURE;  Surgeon: 06/15/2016 Dillingham, DO;  Location: [facility];  Service: Plastics;  Laterality: Left;   Social History   Socioeconomic History  . Marital status: Single    Spouse name: Not on  file  . Number of children: Not on file  . Years of education: Not on file  . Highest education level: Not on file  Occupational History  . Not on file  Tobacco Use  . Smoking status: Never Smoker  . Smokeless tobacco: Never Used  Vaping Use  . Vaping Use: Never used  Substance and Sexual Activity  . Alcohol use: No  . Drug use: No  . Sexual activity: Not Currently    Partners: Male    Birth control/protection: None  Other Topics Concern  . Not on file  Social History Narrative  . Not on file   Social Determinants of Health   Financial Resource Strain: Not on file  Food Insecurity: Not on file  Transportation Needs: Not on file  Physical Activity: Not on file  Stress: Not on file  Social Connections: Not on file  Intimate Partner Violence: Not on file   No current facility-administered medications on file prior to encounter.   Current Outpatient Medications on File Prior to Encounter  Medication Sig Dispense Refill  . acetaminophen (TYLENOL) 325 MG tablet Take  650 mg by mouth every 6 (six) hours as needed for mild pain or headache.    . Blood Pressure Monitoring (ADULT BLOOD PRESSURE CUFF LG) KIT To monitor BP at home 1 kit 0  . ibuprofen (ADVIL) 800 MG tablet Take 1 tablet (800 mg total) by mouth every 8 (eight) hours. 30 tablet 0  . NIFEdipine (PROCARDIA-XL/NIFEDICAL-XL) 30 MG 24 hr tablet Take 1 tablet (30 mg total) by mouth daily. 30 tablet 2  . Prenatal Vit-Fe Fumarate-FA (PRENATAL MULTIVITAMIN) TABS tablet Take 1 tablet by mouth daily at 12 noon.     Allergies  Allergen Reactions  . Shellfish Allergy Hives    I have reviewed the past Medical Hx, Surgical Hx, Social Hx, Allergies and Medications.   REVIEW OF SYSTEMS All systems reviewed and are negative for acute change except as noted in the HPI.   OBJECTIVE BP (!) 127/59 (BP Location: Right Arm)   Pulse 72   Temp 98.7 F (37.1 C) (Oral)   Resp 16   Ht $R'5\' 5"'bO$  (1.651 m)   Wt 93.8 kg   LMP  (LMP  Unknown)   SpO2 97% Comment: room air  BMI 34.41 kg/m    PHYSICAL EXAM Constitutional: Well-developed, well-nourished female in no acute distress.  Cardiovascular: normal rate and rhythm, pulses intact Respiratory: normal rate and effort.  GI: Abd soft, non-tender, non-distended. Pos BS x 4 MS: Extremities nontender, no edema, normal ROM Neurologic: Alert and oriented x 4. No focal deficits GU: Neg CVAT. SPECULUM EXAM: Deferred Psych: normal mood and affect  LAB RESULTS Results for orders placed or performed during the hospital encounter of 12/25/20 (from the past 24 hour(s))  hCG, quantitative, pregnancy     Status: Abnormal   Collection Time: 12/25/20  5:38 PM  Result Value Ref Range   hCG, Beta Chain, Quant, S 9,639 (H) <5 mIU/mL  CBC WITH DIFFERENTIAL     Status: Abnormal   Collection Time: 12/25/20  5:38 PM  Result Value Ref Range   WBC 6.2 4.0 - 10.5 K/uL   RBC 4.41 3.87 - 5.11 MIL/uL   Hemoglobin 11.9 (L) 12.0 - 15.0 g/dL   HCT 36.6 36.0 - 46.0 %   MCV 83.0 80.0 - 100.0 fL   MCH 27.0 26.0 - 34.0 pg   MCHC 32.5 30.0 - 36.0 g/dL   RDW 14.0 11.5 - 15.5 %   Platelets 256 150 - 400 K/uL   nRBC 0.0 0.0 - 0.2 %   Neutrophils Relative % 56 %   Neutro Abs 3.4 1.7 - 7.7 K/uL   Lymphocytes Relative 36 %   Lymphs Abs 2.2 0.7 - 4.0 K/uL   Monocytes Relative 5 %   Monocytes Absolute 0.3 0.1 - 1.0 K/uL   Eosinophils Relative 2 %   Eosinophils Absolute 0.1 0.0 - 0.5 K/uL   Basophils Relative 1 %   Basophils Absolute 0.0 0.0 - 0.1 K/uL   Immature Granulocytes 0 %   Abs Immature Granulocytes 0.01 0.00 - 0.07 K/uL  Comprehensive metabolic panel     Status: Abnormal   Collection Time: 12/25/20  5:38 PM  Result Value Ref Range   Sodium 138 135 - 145 mmol/L   Potassium 3.3 (L) 3.5 - 5.1 mmol/L   Chloride 104 98 - 111 mmol/L   CO2 27 22 - 32 mmol/L   Glucose, Bld 103 (H) 70 - 99 mg/dL   BUN 5 (L) 6 - 20 mg/dL   Creatinine, Ser 0.74 0.44 - 1.00 mg/dL  Calcium 9.1 8.9 - 10.3  mg/dL   Total Protein 6.9 6.5 - 8.1 g/dL   Albumin 3.8 3.5 - 5.0 g/dL   AST 18 15 - 41 U/L   ALT 16 0 - 44 U/L   Alkaline Phosphatase 65 38 - 126 U/L   Total Bilirubin 0.5 0.3 - 1.2 mg/dL   GFR, Estimated >60 >60 mL/min   Anion gap 7 5 - 15    IMAGING No results found.  MAU Management/MDM: Vitals and nursing notes reviewed Orders Placed This Encounter  Procedures  . hCG, quantitative, pregnancy  . CBC WITH DIFFERENTIAL  . Comprehensive metabolic panel  . Notify physician  . Discharge patient Discharge disposition: 01-Home or Self Care; Discharge patient date: 12/25/2020    Meds ordered this encounter  Medications  . methotrexate (for ectopic pregnancy) chemo injection 103.5 mg    Plan of care reviewed with patient, including labs and tests ordered and medical treatment.  Consult DR Landry Mellow.  Treatments in MAU included unremarkable CMP, CBC, Beta HCG 9,639. VSS.   ASSESSMENT 1. Left ovarian pregnancy without intrauterine pregnancy   Kristin Lyons is a 30 y.o. [phone] at Unknown who presents to MAU Left ovary ectropic pregnancy, presented for beta hcg, cmp, cbc, blood type O+, denies pain, endorses having light spotting. Pt denies sob, cp. POC developed questions answered. VSS, labs unremarkable, hgb 11.9, Beta HCG 9639  Per [facility] HPI visit: Here for NOB w/u--delivered with [facility] 06/2020, this pregnancy unplanned. Should be 12 2/7 weeks by LMP. Hx right ectopic 2019, with L/S removal of ectopic, tubal preservation, by SR. Had positive UPT at home, and at today's visit. Denies abdominal pain, had slight spotting this week. O+ by prior blood typing. No FHT auscultated, uterine size < 10 weeks, unable to see GS on bedside US. No abdominal pain on palpation. Formal US--TA/TV US--no GS or FP within uterus, cystic structure with solid component seen within left ovary, cannot r/o ectopic, structure measures 7 weeks. RO WNL, bilateral adnexas otherwise WNL, cervix closed, trace FF  in posterior cul de sac. Dr. Alesia Richards in to view images and discuss with patient. Presumptive dx is ectopic, given [place] presentation and hx of prior ectopic on same side. Options for surgical removal or Methotrexate reviewed. Dr. Alesia Richards recommended surgery, but patient advises she has no others to care for her children at this time, would be able to have surgery week of 6/6 when children go out of town. R&B of delaying/deferring surgery were reviewed with patient by Dr. Alesia Richards. Patient will start with MTX today in MAU, with close f/u over the next week. Lives close to hospital, has emergent transportation if needed. To MAU today, JM notified. Patient to RTO in 1 week for f/u with EK or VL. Precautions reviewed, to contact [facility] with any pain, bleeding, or other concerns. Support to patient for this situation--she is coping well, advises "I already have my girl". Instructed to go to MAU now for labs and MTX administration. 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  PLAN Discharge home in stable condition. Pt discharged with strict bleeding precautions. MAU lab visit for 12/28/2020 and 12/31/2020 beta HCG.  F/U with [facility] within one week with DR Alesia Richards.  Counseled on return precautions Handout given   Follow-up Information    [facility] Follow up.   Specialty: Obstetrics and Gynecology Why: 12/28/2020 for Beta HCG and 12/31/2020 Contact information: [address] [id number] mc [place] [facility]. Schedule an appointment as soon as possible for a visit in 1 week(s).   Specialty: Obstetrics and Gynecology Why: 1 week f/u Contact information: [place]. [address] [place] [address] [phone]              Allergies as of 12/25/2020      Reactions   Shellfish Allergy Hives      Medication List    TAKE these  medications   acetaminophen 325 MG tablet Commonly known as: TYLENOL Take 650 mg by mouth every 6 (six) hours as needed for mild pain or headache.   Adult Blood Pressure Cuff Lg Kit To monitor BP at home   ibuprofen 800 MG tablet Commonly known as: ADVIL Take 1 tablet (800 mg total) by mouth every 8 (eight) hours.   NIFEdipine 30 MG 24 hr tablet Commonly known as: PROCARDIA-XL/NIFEDICAL-XL Take 1 tablet (30 mg total) by mouth daily.   prenatal multivitamin Tabs tablet Take 1 tablet by mouth daily at 12 noon.        '@SIGN'$ @ 12/25/2020, 7:47 PM

## 2020-12-25 NOTE — MAU Note (Signed)
Kristin Lyons is a 30 y.o. here in MAU reporting: states she had an u/s in the office and that the pregnancy is in her ovaries. Was supposed to come on Friday for MTX but was unable to. Having some bleeding, states it is like a period. No pain.  LMP: unknown  Onset of complaint: ongoing  Pain score: 0/10  Vitals:   12/25/20 1712  BP: (!) 127/59  Pulse: 72  Resp: 16  Temp: 98.7 F (37.1 C)  SpO2: 97%     Lab orders placed from triage: none

## 2020-12-25 NOTE — MAU Note (Signed)
Discussed lab results with [facility] CNM. Okay to administer MTX and pt can be discharged 20 min after administration.

## 2021-01-17 ENCOUNTER — Inpatient Hospital Stay (HOSPITAL_COMMUNITY)
Admission: AD | Admit: 2021-01-17 | Discharge: 2021-01-17 | Disposition: A | Discharge status: Home / Self Care | Payer: 59 | Attending: Obstetrics & Gynecology | Admitting: Obstetrics & Gynecology

## 2021-01-17 ENCOUNTER — Inpatient Hospital Stay (HOSPITAL_COMMUNITY): Payer: 59

## 2021-01-17 ENCOUNTER — Encounter (HOSPITAL_COMMUNITY): Payer: Self-pay | Admitting: Obstetrics & Gynecology

## 2021-01-17 ENCOUNTER — Inpatient Hospital Stay (HOSPITAL_COMMUNITY): Payer: 59 | Admitting: Certified Registered Nurse Anesthetist

## 2021-01-17 ENCOUNTER — Encounter (HOSPITAL_COMMUNITY)
Admission: AD | Disposition: A | Discharge status: Home / Self Care | Payer: Self-pay | Source: Home / Self Care | Attending: Obstetrics & Gynecology

## 2021-01-17 DIAGNOSIS — Z20822 Contact with and (suspected) exposure to covid-19: Secondary | ICD-10-CM | POA: Insufficient documentation

## 2021-01-17 DIAGNOSIS — O26899 Other specified pregnancy related conditions, unspecified trimester: Secondary | ICD-10-CM

## 2021-01-17 DIAGNOSIS — R109 Unspecified abdominal pain: Secondary | ICD-10-CM

## 2021-01-17 DIAGNOSIS — Z3A00 Weeks of gestation of pregnancy not specified: Secondary | ICD-10-CM | POA: Insufficient documentation

## 2021-01-17 DIAGNOSIS — O009 Unspecified ectopic pregnancy without intrauterine pregnancy: Secondary | ICD-10-CM | POA: Diagnosis not present

## 2021-01-17 DIAGNOSIS — Z8619 Personal history of other infectious and parasitic diseases: Secondary | ICD-10-CM | POA: Diagnosis not present

## 2021-01-17 HISTORY — PX: DIAGNOSTIC LAPAROSCOPY WITH REMOVAL OF ECTOPIC PREGNANCY: SHX6449

## 2021-01-17 LAB — COMPREHENSIVE METABOLIC PANEL
ALT: 17 U/L (ref 0–44)
AST: 17 U/L (ref 15–41)
Albumin: 3.8 g/dL (ref 3.5–5.0)
Alkaline Phosphatase: 79 U/L (ref 38–126)
Anion gap: 8 (ref 5–15)
BUN: 10 mg/dL (ref 6–20)
CO2: 26 mmol/L (ref 22–32)
Calcium: 8.9 mg/dL (ref 8.9–10.3)
Chloride: 103 mmol/L (ref 98–111)
Creatinine, Ser: 0.71 mg/dL (ref 0.44–1.00)
GFR, Estimated: >60 mL/min (ref >60)
Glucose, Bld: 102 mg/dL — ABNORMAL HIGH (ref 70–99)
Potassium: 3.7 mmol/L (ref 3.5–5.1)
Sodium: 137 mmol/L (ref 135–145)
Total Bilirubin: 0.1 mg/dL — ABNORMAL LOW (ref 0.3–1.2)
Total Protein: 7 g/dL (ref 6.5–8.1)

## 2021-01-17 LAB — CBC
HCT: 37.6 % (ref 36.0–46.0)
HCT: 34.6 % — ABNORMAL LOW (ref 36.0–46.0)
Hemoglobin: 11.9 g/dL — ABNORMAL LOW (ref 12.0–15.0)
Hemoglobin: 11.1 g/dL — ABNORMAL LOW (ref 12.0–15.0)
MCH: 27 pg (ref 26.0–34.0)
MCH: 27.3 pg (ref 26.0–34.0)
MCHC: 31.6 g/dL (ref 30.0–36.0)
MCHC: 32.1 g/dL (ref 30.0–36.0)
MCV: 85.5 fL (ref 80.0–100.0)
MCV: 85.2 fL (ref 80.0–100.0)
Platelets: 271 10*3/uL (ref 150–400)
Platelets: 267 10*3/uL (ref 150–400)
RBC: 4.4 MIL/uL (ref 3.87–5.11)
RBC: 4.06 MIL/uL (ref 3.87–5.11)
RDW: 14.2 % (ref 11.5–15.5)
RDW: 14.3 % (ref 11.5–15.5)
WBC: 7.5 10*3/uL (ref 4.0–10.5)
WBC: 7.7 10*3/uL (ref 4.0–10.5)
nRBC: 0 % (ref 0.0–0.2)
nRBC: 0 % (ref 0.0–0.2)

## 2021-01-17 LAB — RESP PANEL BY RT-PCR (FLU A&B, COVID) ARPGX2
Influenza A by PCR: NEGATIVE
Influenza B by PCR: NEGATIVE
SARS Coronavirus 2 by RT PCR: NEGATIVE

## 2021-01-17 LAB — PREPARE RBC (CROSSMATCH)

## 2021-01-17 LAB — HCG, QUANTITATIVE, PREGNANCY: hCG, Beta Chain, Quant, S: 884 m[IU]/mL — ABNORMAL HIGH (ref <5)

## 2021-01-17 SURGERY — LAPAROSCOPY, WITH ECTOPIC PREGNANCY SURGICAL TREATMENT
Anesthesia: General | Site: Abdomen

## 2021-01-17 MED ORDER — AMISULPRIDE (ANTIEMETIC) 5 MG/2ML IV SOLN: 10.0000 mg | Freq: Once | INTRAVENOUS | Status: DC | PRN

## 2021-01-17 MED ORDER — PHENYLEPHRINE HCL-NACL 10-0.9 MG/250ML-% IV SOLN
INTRAVENOUS | Status: DC | PRN
  Administered 2021-01-17: 20 ug/min via INTRAVENOUS

## 2021-01-17 MED ORDER — CHLORHEXIDINE GLUCONATE 0.12 % MT SOLN: 15.0000 mL | Freq: Once | OROMUCOSAL | Status: AC

## 2021-01-17 MED ORDER — SODIUM CHLORIDE 0.9 % IR SOLN
Status: DC | PRN
  Administered 2021-01-17 (×2): 1000 mL

## 2021-01-17 MED ORDER — ONDANSETRON HCL 4 MG/2ML IJ SOLN
4.0000 mg | Freq: Once | INTRAMUSCULAR | Status: AC
  Administered 2021-01-17: 4 mg via INTRAVENOUS
  Filled 2021-01-17: qty 2

## 2021-01-17 MED ORDER — KETOROLAC TROMETHAMINE 30 MG/ML IJ SOLN
30.0000 mg | Freq: Once | INTRAMUSCULAR | Status: AC
  Administered 2021-01-17: 30 mg via INTRAVENOUS

## 2021-01-17 MED ORDER — SUGAMMADEX SODIUM 200 MG/2ML IV SOLN
INTRAVENOUS | Status: DC | PRN
  Administered 2021-01-17: 200 mg via INTRAVENOUS

## 2021-01-17 MED ORDER — ROCURONIUM BROMIDE 10 MG/ML (PF) SYRINGE
PREFILLED_SYRINGE | INTRAVENOUS | Status: DC | PRN
  Administered 2021-01-17: 90 mg via INTRAVENOUS

## 2021-01-17 MED ORDER — BUPIVACAINE HCL 0.25 % IJ SOLN
INTRAMUSCULAR | Status: DC | PRN
  Administered 2021-01-17: 17 mL

## 2021-01-17 MED ORDER — PROMETHAZINE HCL 25 MG/ML IJ SOLN: 6.2500 mg | INTRAMUSCULAR | Status: DC | PRN

## 2021-01-17 MED ORDER — HYDROMORPHONE HCL 1 MG/ML IJ SOLN
1.0000 mg | Freq: Once | INTRAMUSCULAR | Status: AC
  Administered 2021-01-17: 1 mg via INTRAVENOUS
  Filled 2021-01-17: qty 1

## 2021-01-17 MED ORDER — PROPOFOL 10 MG/ML IV BOLUS
INTRAVENOUS | Status: DC | PRN
  Administered 2021-01-17: 160 mg via INTRAVENOUS

## 2021-01-17 MED ORDER — FENTANYL CITRATE (PF) 250 MCG/5ML IJ SOLN
INTRAMUSCULAR | Status: AC
  Filled 2021-01-17: qty 5

## 2021-01-17 MED ORDER — FENTANYL CITRATE (PF) 250 MCG/5ML IJ SOLN
INTRAMUSCULAR | Status: DC | PRN
  Administered 2021-01-17: 100 ug via INTRAVENOUS
  Administered 2021-01-17: 50 ug via INTRAVENOUS

## 2021-01-17 MED ORDER — HEMOSTATIC AGENTS (NO CHARGE) OPTIME
TOPICAL | Status: DC | PRN
  Administered 2021-01-17: 2 via TOPICAL

## 2021-01-17 MED ORDER — IBUPROFEN 800 MG PO TABS: 800.0000 mg | ORAL_TABLET | Freq: Three times a day (TID) | ORAL | 0 refills | Status: DC

## 2021-01-17 MED ORDER — HYDROMORPHONE HCL 1 MG/ML IJ SOLN: 0.2500 mg | INTRAMUSCULAR | Status: DC | PRN

## 2021-01-17 MED ORDER — MIDAZOLAM HCL 2 MG/2ML IJ SOLN
INTRAMUSCULAR | Status: AC
  Filled 2021-01-17: qty 2

## 2021-01-17 MED ORDER — MIDAZOLAM HCL 2 MG/2ML IJ SOLN
INTRAMUSCULAR | Status: DC | PRN
  Administered 2021-01-17: 2 mg via INTRAVENOUS

## 2021-01-17 MED ORDER — SILVER NITRATE-POT NITRATE 75-25 % EX MISC
CUTANEOUS | Status: AC
  Filled 2021-01-17: qty 10

## 2021-01-17 MED ORDER — OXYCODONE HCL 5 MG PO TABS: 5.0000 mg | ORAL_TABLET | Freq: Once | ORAL | Status: DC | PRN

## 2021-01-17 MED ORDER — LIDOCAINE 2% (20 MG/ML) 5 ML SYRINGE
INTRAMUSCULAR | Status: DC | PRN
  Administered 2021-01-17: 80 mg via INTRAVENOUS

## 2021-01-17 MED ORDER — PHENYLEPHRINE 40 MCG/ML (10ML) SYRINGE FOR IV PUSH (FOR BLOOD PRESSURE SUPPORT)
PREFILLED_SYRINGE | INTRAVENOUS | Status: DC | PRN
  Administered 2021-01-17: 80 ug via INTRAVENOUS

## 2021-01-17 MED ORDER — OXYCODONE HCL 5 MG/5ML PO SOLN: 5.0000 mg | Freq: Once | ORAL | Status: DC | PRN

## 2021-01-17 MED ORDER — KETOROLAC TROMETHAMINE 30 MG/ML IJ SOLN
INTRAMUSCULAR | Status: DC | PRN
  Administered 2021-01-17: 30 mg via INTRAVENOUS

## 2021-01-17 MED ORDER — MEPERIDINE HCL 25 MG/ML IJ SOLN: 6.2500 mg | INTRAMUSCULAR | Status: DC | PRN

## 2021-01-17 MED ORDER — KETOROLAC TROMETHAMINE 30 MG/ML IJ SOLN
INTRAMUSCULAR | Status: AC
  Filled 2021-01-17: qty 1

## 2021-01-17 MED ORDER — PROPOFOL 10 MG/ML IV BOLUS
INTRAVENOUS | Status: AC
  Filled 2021-01-17: qty 20

## 2021-01-17 MED ORDER — BUPIVACAINE HCL (PF) 0.25 % IJ SOLN
INTRAMUSCULAR | Status: AC
  Filled 2021-01-17: qty 30

## 2021-01-17 MED ORDER — ORAL CARE MOUTH RINSE: 15.0000 mL | Freq: Once | OROMUCOSAL | Status: AC

## 2021-01-17 MED ORDER — HYDROMORPHONE HCL 1 MG/ML IJ SOLN
1.0000 mg | INTRAMUSCULAR | Status: DC | PRN
Start: 2021-01-17 — End: 2021-01-17
  Administered 2021-01-17: 1 mg via INTRAVENOUS
  Filled 2021-01-17: qty 1

## 2021-01-17 MED ORDER — DEXAMETHASONE SODIUM PHOSPHATE 10 MG/ML IJ SOLN
INTRAMUSCULAR | Status: DC | PRN
  Administered 2021-01-17: 10 mg via INTRAVENOUS

## 2021-01-17 MED ORDER — HYDROMORPHONE HCL 1 MG/ML IJ SOLN
0.5000 mg | Freq: Once | INTRAMUSCULAR | Status: AC
  Administered 2021-01-17: 0.5 mg via INTRAVENOUS
  Filled 2021-01-17: qty 1

## 2021-01-17 MED ORDER — CHLORHEXIDINE GLUCONATE 0.12 % MT SOLN
OROMUCOSAL | Status: AC
  Administered 2021-01-17: 15 mL via OROMUCOSAL
  Filled 2021-01-17: qty 15

## 2021-01-17 MED ORDER — LACTATED RINGERS IV SOLN: INTRAVENOUS | Status: DC

## 2021-01-17 MED ORDER — ONDANSETRON HCL 4 MG/2ML IJ SOLN
INTRAMUSCULAR | Status: DC | PRN
  Administered 2021-01-17: 4 mg via INTRAVENOUS

## 2021-01-17 MED ORDER — HYDROCODONE-ACETAMINOPHEN 5-325 MG PO TABS: 1.0000 | ORAL_TABLET | Freq: Four times a day (QID) | ORAL | 0 refills | Status: DC | PRN

## 2021-01-17 MED ORDER — METHYLENE BLUE 0.5 % INJ SOLN
INTRAVENOUS | Status: AC
  Filled 2021-01-17: qty 10

## 2021-01-17 SURGICAL SUPPLY — 43 items
ADH SKN CLS APL DERMABOND .7 (GAUZE/BANDAGES/DRESSINGS) ×1
APL SRG 38 LTWT LNG FL B (MISCELLANEOUS) ×1
APPLICATOR ARISTA FLEXITIP XL (MISCELLANEOUS) ×1 IMPLANT
BAG SPEC RTRVL LRG 6X4 10 (ENDOMECHANICALS) ×2
CABLE HIGH FREQUENCY MONO STRZ (ELECTRODE) IMPLANT
DEFOGGER SCOPE WARMER CLEARIFY (MISCELLANEOUS) ×2 IMPLANT
DERMABOND ADVANCED (GAUZE/BANDAGES/DRESSINGS) ×1
DERMABOND ADVANCED .7 DNX12 (GAUZE/BANDAGES/DRESSINGS) ×1 IMPLANT
DISSECTOR BLUNT TIP ENDO 5MM (MISCELLANEOUS) IMPLANT
DRAPE UNDERBUTTOCKS STRL (DISPOSABLE) ×1 IMPLANT
DRSG OPSITE POSTOP 3X4 (GAUZE/BANDAGES/DRESSINGS) ×1 IMPLANT
DURAPREP 26ML APPLICATOR (WOUND CARE) ×2 IMPLANT
GLOVE BIO SURGEON STRL SZ 6.5 (GLOVE) ×2 IMPLANT
GLOVE SURG UNDER POLY LF SZ7 (GLOVE) ×8 IMPLANT
GOWN STRL REUS W/ TWL LRG LVL3 (GOWN DISPOSABLE) ×2 IMPLANT
GOWN STRL REUS W/TWL LRG LVL3 (GOWN DISPOSABLE) ×4
HEMOSTAT ARISTA ABSORB 3G PWDR (HEMOSTASIS) ×2 IMPLANT
KIT TURNOVER KIT B (KITS) ×2 IMPLANT
LIGASURE VESSEL 5MM BLUNT TIP (ELECTROSURGICAL) ×1 IMPLANT
NS IRRIG 1000ML POUR BTL (IV SOLUTION) ×2 IMPLANT
PACK LAPAROSCOPY BASIN (CUSTOM PROCEDURE TRAY) ×2 IMPLANT
PACK TRENDGUARD 450 HYBRID PRO (MISCELLANEOUS) IMPLANT
POUCH LAPAROSCOPIC INSTRUMENT (MISCELLANEOUS) ×2 IMPLANT
POUCH SPECIMEN RETRIEVAL 10MM (ENDOMECHANICALS) ×2 IMPLANT
PROTECTOR NERVE ULNAR (MISCELLANEOUS) ×4 IMPLANT
SCISSORS LAP 5X35 DISP (ENDOMECHANICALS) ×1 IMPLANT
SET IRRIG TUBING LAPAROSCOPIC (IRRIGATION / IRRIGATOR) ×1 IMPLANT
SET TRI-LUMEN FLTR TB AIRSEAL (TUBING) IMPLANT
SET TUBE SMOKE EVAC HIGH FLOW (TUBING) ×2 IMPLANT
SHEET LAVH (DRAPES) ×1 IMPLANT
SLEEVE ENDOPATH XCEL 5M (ENDOMECHANICALS) ×2 IMPLANT
SOLUTION ELECTROLUBE (MISCELLANEOUS) IMPLANT
SUT MNCRL AB 4-0 PS2 18 (SUTURE) ×2 IMPLANT
SUT MON AB 4-0 PS1 27 (SUTURE) ×2 IMPLANT
SUT VICRYL 0 UR6 27IN ABS (SUTURE) ×2 IMPLANT
SYR 5ML LL (SYRINGE) IMPLANT
TOWEL GREEN STERILE FF (TOWEL DISPOSABLE) ×4 IMPLANT
TRAY FOLEY W/BAG SLVR 14FR (SET/KITS/TRAYS/PACK) ×2 IMPLANT
TRENDGUARD 450 HYBRID PRO PACK (MISCELLANEOUS)
TROCAR PORT AIRSEAL 5X120 (TROCAR) IMPLANT
TROCAR XCEL NON-BLD 11X100MML (ENDOMECHANICALS) ×1 IMPLANT
TROCAR XCEL NON-BLD 5MMX100MML (ENDOMECHANICALS) ×2 IMPLANT
WARMER LAPAROSCOPE (MISCELLANEOUS) ×2 IMPLANT

## 2021-01-17 NOTE — Op Note (Signed)
OPERATIVE NOTE  KALANI BARAY  DOB:    [birth year]  MRN:    [id number]  CSN:    [id number]  Date of Surgery:  01/17/2021   Preoperative Diagnosis: Ruptured ectopic  Postoperative Diagnosis: Ruptured ectopic pregnancy and severe pelvic adhesions  Procedure: 1. Diagnostic Laparoscopy 2. Operative Laparoscopy / left salpingectomy 3. Evacuation of hemoperitoneum 4. Adhesiolysis   Surgeon: Delila Spence. Mora Appl, M.D.  Assistant: None  Anesthetic: General ETA  Estimated Blood Loss:  700 mL         Drains: Foley catheter         Total IV Fluids: 1000 ml  Blood Given: none          Specimens: Left tube with ectopic pregnancy         Implants: none        Complications:  * No complications entered in OR log *         Disposition: PACU - hemodynamically stable.         Condition: stable    Disposition:  The patient presents with the above-mentioned diagnosis. She understands the indications for surgical procedure.  She also understands the alternative treatment options. She accepts the risk of, but not limited to, anesthetic complications, bleeding, infections, and possible damage to the surrounding organs.  Indication:  Ms. Adan is a young 30 year old female with early pregnancy, pelvic pain and bleeding s/p methotrexate. Ultrasound shows no intrauterine pregnancy, adnexal mass and free fluid in the cul de sac.   Findings:  Exam under anesthesia: Normal uterus, mobile anteverted, no palpable masses, Fullness palpable left adnexa  Vagina: normal ruggae, cervix grossly normal  Laparoscopy: Large amount of hemoperitoneum; left tubal pregnancy visualized. Extensive scarring of the left hemipelvis, from the adnexa down to the Pouch of douglas. Possible old endometriosis.  The left ovary stuck to the rectum and posterior uterus. Fairly normal appearing right ovary and fallopian tube.Globular appearing uterus. Normal appendix, Bowel and liver appeared normal   Procedure:   The patient was taken to the operating room where general anesthesia was found to be adequate. She was placed in dorsal lithotomy position and examined under anesthesia was performed with findings noted above. The patient's abdomen, perineum, and vagina were prepped with sterile solution. The bladder was drained of urine with a foley catheter. A sterile operative graves speculum was placed and the anterior lip of the cervix held with a single-toothed tenaculum. An acorn uterine manipulator was placed inside the uterus. Surgeon gloves and gown were changed and attention was turned to the abdomen. The subumbilical area was injected with half percent Marcaine with epinephrine. A subumbilical incision was made using an 11 blade scalpel and the peritoneum was entered directly using a 11 mm Excel Visaport with 0 degree 5mm laparoscope attached. Confirmation of entry into the peritoneum was confirmed with opening pressure of CO2 and direct visualization. Pneumoperitoneum was obtained using approximately 2 L of CO2 gas. The pelvis / abdomen and pelvic organs were inspected with findings as mentioned above. Quarter percent Marcaine  was injected in the left lower quadrant. A 5 mm air seal trocar was placed in the lower left abdomen cavity under direct visualization. Another 40mm trocar site was obtained along the upper left abdomen. There was no noted injury with placement of any of the trocars.   Pictures were taken of the patient's pelvic structures. The hemoperitoneum was suctioned out of the abdomen using the Nezhat suction and irrigation performed. There was dissection of  the scar tissue along the left pelvis using the monopolar scissors. The left fallopian tube was identified and followed to its fimbriated end. The proximal portion of the left fallopian tube was cauterized along the mesosalpinx with the Ligasure and transected off the uterus at the cornual end.   The specimen was removed through the left side 105mm  incision with an endocatch. The specimen was handed off the field to be sent to Pathology. The pelvis, and abdomen was irrigated with sterile normal saline.  The surgical field was sprayed with Arista to help with the small peritoneal oozing from the scarring. The surgical area was inspected again and noted to be hemostatic. The pneumoperitoneum was allowed to escape. All instruments were removed. The subumbilical fascia was closed using 0 Vicryl on a UR-6 needle. The skin was reapproximated using 3-0 Monocryl and covered with Dermabond and op site placed on the larger incision. All instrumentation was removed from the vagina and the cervix noted to be hemostatic. The foley catheter to be removed in the pacu.   Sponge, needle, instrument counts were correct. The patient tolerated her procedure well. She was awakened from her anesthetic without difficulty. She was transported to the recovery room in stable condition.    Naoma Diener S. Mora Appl, M.D.

## 2021-01-17 NOTE — MAU Provider Note (Signed)
Chief Complaint:  Abdominal Pain   Event Date/Time   First Provider Initiated Contact with Patient 01/17/21 0459     HPI: Kristin Lyons is a 30 y.o. [id number] s/p ectopic pregnancy x2wks ago resolved with MTX who presents to maternity admissions reporting severe left-sided abdominal pain (10/10 "it feels like someone is stabbing me). Has had pain on and off since the methotrexate therapy and has been counseled to return to MAU but has not been able to due to childcare concerns. No vaginal bleeding, having some nausea due to the pain but no other physical symptoms.  Pregnancy Course: Unplanned ectopic pregnancy in the left tube, given methotrexate on 12/25/20 and has been getting regular bHCG testing for follow up.  Past Medical History:  Diagnosis Date   Asthma    Asthma    Bacterial infection    History of chicken pox    Trichomonas    Yeast infection    OB History  Gravida Para Term Preterm AB Living  _0 0 3 3  SAB IAB Ectopic Multiple Live Births  2 0 1 0 3    # Outcome Date GA Lbr Len/2nd Weight Sex Delivery Anes PTL Lv  8 Current           7 Term 07/19/20 [redacted]w[redacted]d/ 00:31 6 lb 7.4 oz (2.931 kg) F Vag-Spont EPI  LIV     Birth Comments: wnl  6 Term 12/09/17 319w0d1:40 / 00:14 6 lb 0.5 oz (2.736 kg) M Vag-Spont EPI  LIV  5 Para 09/17/14 1969w4d:20 / 00:02 8.1 oz (0.23 kg) F Vag-Spont None  FD  4 Term 07/28/11 39w58w2d56 / 00:53 5 lb 10.1 oz (2.555 kg) M Vag-Spont EPI  LIV  3 Ectopic           2 SAB           1 SAB            Past Surgical History:  Procedure Laterality Date   DILATION AND CURETTAGE OF UTERUS     DILATION AND CURETTAGE OF UTERUS N/A 09/17/2014   Procedure: DILATATION AND CURETTAGE;  Surgeon: AngeDelice Lesch;  [facility];  Service: Gynecology;  Laterality: N/A;   DILATION AND EVACUATION N/A 09/17/2014   Procedure: DILATATION AND EVACUATION;  Surgeon: AngeDelice Lesch;  [facility];  Service: Gynecology;  Laterality: N/A;   LAPAROSCOPY  N/A 03/18/2017   Procedure: LAPAROSCOPY OPERATIVE WITH REMOVAL LEFT ECTOPIC PREGNANCY;  Surgeon: RivaDelsa Bern;  [facility];  Service: Gynecology;  Laterality: N/A;   ORIF ORBITAL FRACTURE Left 06/13/2016   Procedure: OPEN REDUCTION INTERNAL FIXATION (ORIF) ORBITAL FRACTURE;  Surgeon: ClaiLoel Loftylingham, DO;  [facility];  Laterality: Left;   Family History  Problem Relation Age of Onset   Hypertension Maternal Uncle    Hypertension Paternal Uncle    Asthma Mother    Healthy Father    Social History   Tobacco Use   Smoking status: Never   Smokeless tobacco: Never  Vaping Use   Vaping Use: Never used  Substance Use Topics   Alcohol use: No   Drug use: No   Allergies  Allergen Reactions   Shellfish Allergy Hives   Medications Prior to Admission  Medication Sig Dispense Refill Last Dose   acetaminophen (TYLENOL) 325 MG tablet Take 650 mg by mouth every 6 (six) hours as needed for mild pain or headache.   01/16/2021  ibuprofen (ADVIL) 800 MG tablet Take 1 tablet (800 mg total) by mouth every 8 (eight) hours. 30 tablet 0 01/16/2021   Blood Pressure Monitoring (ADULT BLOOD PRESSURE CUFF LG) KIT To monitor BP at home 1 kit 0    NIFEdipine (PROCARDIA-XL/NIFEDICAL-XL) 30 MG 24 hr tablet Take 1 tablet (30 mg total) by mouth daily. 30 tablet 2    Prenatal Vit-Fe Fumarate-FA (PRENATAL MULTIVITAMIN) TABS tablet Take 1 tablet by mouth daily at 12 noon.      I have reviewed patient's Past Medical Hx, Surgical Hx, Family Hx, Social Hx, medications and allergies.   ROS:  Pertinent items noted in HPI and remainder of comprehensive ROS otherwise negative.  Physical Exam  Patient Vitals for the past 24 hrs:  BP Temp Temp src Pulse Resp SpO2  01/17/21 0615 -- -- -- -- -- 100 %  01/17/21 0612 128/65 -- -- 75 -- --  01/17/21 0500 (!) 154/83 98.7 F (37.1 C) Oral 76 17 100 %   Constitutional: Well-developed, well-nourished female in acute distress (from  pain).  Cardiovascular: normal rate & rhythm, no murmur Respiratory: normal effort, lung sounds clear throughout GI: Abd soft, acutely tender on the left side, pos BS x 4 MS: Extremities nontender, no edema, normal ROM Neurologic: Alert and oriented x 4.  GU: no CVA tenderness Pelvic exam deferred   Labs: Results for orders placed or performed during the hospital encounter of 01/17/21 (from the past 24 hour(s))  CBC     Status: Abnormal   Collection Time: 01/17/21  5:36 AM  Result Value Ref Range   WBC 7.5 4.0 - 10.5 K/uL   RBC 4.40 3.87 - 5.11 MIL/uL   Hemoglobin 11.9 (L) 12.0 - 15.0 g/dL   HCT 37.6 36.0 - 46.0 %   MCV 85.5 80.0 - 100.0 fL   MCH 27.0 26.0 - 34.0 pg   MCHC 31.6 30.0 - 36.0 g/dL   RDW 14.2 11.5 - 15.5 %   Platelets 271 150 - 400 K/uL   nRBC 0.0 0.0 - 0.2 %  Comprehensive metabolic panel     Status: Abnormal   Collection Time: 01/17/21  5:36 AM  Result Value Ref Range   Sodium 137 135 - 145 mmol/L   Potassium 3.7 3.5 - 5.1 mmol/L   Chloride 103 98 - 111 mmol/L   CO2 26 22 - 32 mmol/L   Glucose, Bld 102 (H) 70 - 99 mg/dL   BUN 10 6 - 20 mg/dL   Creatinine, Ser 0.71 0.44 - 1.00 mg/dL   Calcium 8.9 8.9 - 10.3 mg/dL   Total Protein 7.0 6.5 - 8.1 g/dL   Albumin 3.8 3.5 - 5.0 g/dL   AST 17 15 - 41 U/L   ALT 17 0 - 44 U/L   Alkaline Phosphatase 79 38 - 126 U/L   Total Bilirubin 0.1 (L) 0.3 - 1.2 mg/dL   GFR, Estimated >60 >60 mL/min   Anion gap 8 5 - 15  hCG, quantitative, pregnancy     Status: Abnormal   Collection Time: 01/17/21  5:36 AM  Result Value Ref Range   hCG, Beta Chain, Quant, S 884 (H) <5 mIU/mL  Type and screen     Status: None (Preliminary result)   Collection Time: 01/17/21  5:36 AM  Result Value Ref Range   ABO/RH(D) PENDING    Antibody Screen PENDING    Sample Expiration      01/20/2021,2359 Performed at [facility], [address],  [place], [place] [address]     Imaging:  US OB Transvaginal  Result Date:  01/17/2021 CLINICAL DATA:  Abdominal pain.  Methotrexate treatment. EXAM: TRANSVAGINAL OB ULTRASOUND TECHNIQUE: Transvaginal ultrasound was performed for complete evaluation of the gestation as well as the maternal uterus, adnexal regions, and pelvic cul-de-sac. COMPARISON:  12/07/2019. FINDINGS: Intrauterine gestational sac: None visualized Yolk sac:  None visualized Embryo:  None visualized Cardiac Activity: None visualized Subchorionic hemorrhage:  None visualized Maternal uterus/adnexae: A 7.7 x 3.3 x 5.8 cm complex heterogeneous mass is noted over the left adnexa, possibly extending to the right adnexa. This could represent an ectopic pregnancy. Other etiologies of adnexal masses cannot be excluded. Small amount of free pelvic fluid noted. This exam was limited due to patient's pain. IMPRESSION: 1.  No intrauterine gestational sac or pregnancy noted. 2. A 7.7 x 3.3 x 5.8 cm complex heterogeneous mass is noted over the left adnexa, possibly extending to the right adnexa. This could represent an ectopic pregnancy. Other etiologies of adnexal masses cannot be excluded. Small amount of free pelvic fluid. Critical Value/emergent results were called by telephone at the time of interpretation on 01/17/2021 at 6:21 am to nurse Alandra, who verbally acknowledged these results. Electronically Signed   By: Marcello Moores  Register   On: 01/17/2021 06:26    MAU Course: Orders Placed This Encounter  Procedures   Resp Panel by RT-PCR (Flu A&B, Covid) Nasopharyngeal Swab   US OB Transvaginal   CBC   Comprehensive metabolic panel   hCG, quantitative, pregnancy   Airborne and Contact precautions   Type and screen   Saline lock IV   Meds ordered this encounter  Medications   HYDROmorphone (DILAUDID) injection 0.5 mg   ondansetron (ZOFRAN) injection 4 mg   MDM: Pt arrived via EMS in acute distress due to pain. IV ordered with 0.70m dilaudid with zofran, labs drawn and order for ultrasound put in. While waiting for U/S,  FOB passed out and required rapid response. He stabilized and requested to stay with the pt.  U/S done bedside, sonographer reported adnexal  mass on the left side. Report called to Dr. RMancel Balewho asked to have pt prepped for OR. Type and screen added on, pt given another dose of dilaudid and consent put to bedside.   Care turned over to CAchilleat 0Roberts  JGaylan Gerold CNM, MSN, IKysorvilleCertified Nurse Midwife, CAnnapolisGroup

## 2021-01-17 NOTE — Anesthesia Preprocedure Evaluation (Signed)
Anesthesia Evaluation  Patient identified by MRN, date of birth, ID band Patient awake    Reviewed: Allergy & Precautions, H&P , NPO status , Patient's Chart, lab work & pertinent test results  Airway Mallampati: II  TM Distance: >3 FB Neck ROM: full    Dental no notable dental hx.    Pulmonary asthma ,    Pulmonary exam normal breath sounds clear to auscultation       Cardiovascular hypertension, Normal cardiovascular exam Rhythm:regular Rate:Normal     Neuro/Psych Anxiety Depression    GI/Hepatic   Endo/Other  obese  Renal/GU      Musculoskeletal   Abdominal   Peds  Hematology   Anesthesia Other Findings   Reproductive/Obstetrics                             Anesthesia Physical  Anesthesia Plan  ASA: II  Anesthesia Plan: General   Post-op Pain Management:    Induction: Intravenous  PONV Risk Score and Plan: 3 and Ondansetron, Dexamethasone, Midazolam and Treatment may vary due to age or medical condition  Airway Management Planned: Oral ETT  Additional Equipment:   Intra-op Plan:   Post-operative Plan: Extubation in OR  Informed Consent: I have reviewed the patients History and Physical, chart, labs and discussed the procedure including the risks, benefits and alternatives for the proposed anesthesia with the patient or authorized representative who has indicated his/her understanding and acceptance.       Plan Discussed with: CRNA, Anesthesiologist and Surgeon  Anesthesia Plan Comments:         Anesthesia Quick Evaluation

## 2021-01-17 NOTE — Anesthesia Postprocedure Evaluation (Signed)
Anesthesia Post Note  Patient: Kristin Lyons  Procedure(s) Performed: DIAGNOSTIC LAPAROSCOPY WITH REMOVAL OF left ECTOPIC PREGNANCY (Abdomen)     Patient location during evaluation: PACU Anesthesia Type: General Level of consciousness: awake and alert Pain management: pain level controlled Vital Signs Assessment: post-procedure vital signs reviewed and stable Respiratory status: spontaneous breathing, nonlabored ventilation and respiratory function stable Cardiovascular status: blood pressure returned to baseline and stable Postop Assessment: no apparent nausea or vomiting Anesthetic complications: no   No notable events documented.  Last Vitals:  Vitals:   01/17/21 1506 01/17/21 1521  BP: 118/73 130/69  Pulse: 81 88  Resp: 16 19  Temp:  36.8 C  SpO2: 96% 97%    Last Pain:  Vitals:   01/17/21 1521  TempSrc:   PainSc: Asleep                 Lowella Curb

## 2021-01-17 NOTE — MAU Note (Signed)
Presents to MAU via EMS reporting sharp lower abdominal pain. Patient reports having received MTX two weeks ago and that the pain has been on and off since then.

## 2021-01-17 NOTE — Anesthesia Procedure Notes (Signed)
Procedure Name: Intubation Date/Time: 01/17/2021 11:16 AM Performed by: Thelma Comp, CRNA Pre-anesthesia Checklist: Patient identified, Emergency Drugs available, Suction available and Patient being monitored Patient Re-evaluated:Patient Re-evaluated prior to induction Oxygen Delivery Method: Circle System Utilized Preoxygenation: Pre-oxygenation with 100% oxygen Induction Type: IV induction Ventilation: Mask ventilation without difficulty Laryngoscope Size: Mac and 3 Grade View: Grade I Tube type: Oral Tube size: 7.0 mm Number of attempts: 1 Airway Equipment and Method: Stylet Placement Confirmation: ETT inserted through vocal cords under direct vision, positive ETCO2 and breath sounds checked- equal and bilateral Secured at: 21 cm Tube secured with: Tape Dental Injury: Teeth and Oropharynx as per pre-operative assessment

## 2021-01-17 NOTE — Transfer of Care (Signed)
Immediate Anesthesia Transfer of Care Note  Patient: Kristin Lyons  Procedure(s) Performed: DIAGNOSTIC LAPAROSCOPY WITH REMOVAL OF left ECTOPIC PREGNANCY (Abdomen)  Patient Location: PACU  Anesthesia Type:General  Level of Consciousness: awake, alert  and oriented  Airway & Oxygen Therapy: Patient Spontanous Breathing and Patient connected to nasal cannula oxygen  Post-op Assessment: Report given to RN, Post -op Vital signs reviewed and stable and Patient moving all extremities X 4  Post vital signs: Reviewed and stable  Last Vitals:  Vitals Value Taken Time  BP 106/62 01/17/21 1335  Temp 36.3 C 01/17/21 1335  Pulse 90 01/17/21 1337  Resp 18 01/17/21 1337  SpO2 95 % 01/17/21 1337  Vitals shown include unvalidated device data.  Last Pain:  Vitals:   01/17/21 1028  TempSrc: Oral  PainSc:          Complications: No notable events documented.

## 2021-01-17 NOTE — OR Nursing (Signed)
Patient's mother Leilanee Righetti was called per MD request to consent for removal of left ovary due to rupture. Mother consented via telephone.   Mother's number: [phone]

## 2021-01-17 NOTE — H&P (Addendum)
Subjective:    Kristin Lyons is a 30 y.o. female who presents for evaluation of abd pain. The pain is described as sharp. Onset was several days ago. Symptoms have been gradually worsening since treatment w/ methotrexate 2 weeks ago for left ectopic pregnancy   Review of Systems Pertinent items noted in HPI and remainder of comprehensive ROS otherwise negative.    Objective:   BP 128/65   Pulse 75   Temp 98.7 F (37.1 C) (Oral)   Resp 17   LMP  (LMP Unknown)   SpO2 100%   General:  alert, cooperative, and no distress Skin:  normal Lungs:  unlabored Heart:  regular rate and rhythm, S1, S2 normal, no murmur, click, rub or gallop Abdomen: abnormal findings:  rebound tenderness Genitourinary: defer exam Extremities:  extremities normal, atraumatic, no cyanosis or edema Neurologic:  A/O x3 Psychiatric:  normal mood, behavior, speech, dress, and thought processes    Assessment: Ruptured ectopic   Plan: Surgical removal  Rhea Pink, MSN, CNM 01/17/2021 6:35 AM  Pt examined s/p 0.5mg  and then 1mg  of dilaudid at 0652.  She currently feels much better and does not have an acute abdomen.  No rebound or guarding, minimal tenderness.  The OR's first available time is 12:30pm.  The patient is posted at that time.  She has not had anything to eat or drink since 1900 last night.  Her hgb is stable at 11.9 and was the same 3 wks ago.  Her quant has dropped significantly from 9,639 3 wks ago to 884 this morning.  Ultrasound today shows small amount of FF in pelvis with a 7.7 cm complex heterogeneous mass in left adnexa.  Initial ultrasound in office 12-23-20 had GS and CRL measuring 7wks in left adnexa.  I have discussed risks benefits and alternatives with the patient including but not limited to bleeding infection and injury.  Pt would like to proceed with surgical management.  Questions answered and consent signed and witnessed.  Pt has been crossed for 2 units if needed and a repeat CBC  has been ordered for 1000.  I signed out to Dr. 09-15-1993 at 0700 and will update her on pt's exam.

## 2021-01-18 ENCOUNTER — Encounter (HOSPITAL_COMMUNITY): Payer: Self-pay | Admitting: Obstetrics & Gynecology

## 2021-01-18 LAB — SURGICAL PATHOLOGY

## 2021-01-21 LAB — TYPE AND SCREEN
ABO/RH(D): O POS
Antibody Screen: NEGATIVE
Unit division: 0
Unit division: 0

## 2021-01-21 LAB — BPAM RBC
Blood Product Expiration Date: 202207252359
Blood Product Expiration Date: 202207252359
Unit Type and Rh: 5100
Unit Type and Rh: 5100

## 2021-06-03 ENCOUNTER — Other Ambulatory Visit: Payer: Self-pay

## 2021-06-03 ENCOUNTER — Emergency Department (HOSPITAL_COMMUNITY)
Admission: EM | Admit: 2021-06-03 | Discharge: 2021-06-03 | Disposition: A | Discharge status: Home / Self Care | Payer: 59 | Attending: Emergency Medicine | Admitting: Emergency Medicine

## 2021-06-03 ENCOUNTER — Encounter (HOSPITAL_COMMUNITY): Payer: Self-pay | Admitting: Oncology

## 2021-06-03 DIAGNOSIS — J101 Influenza due to other identified influenza virus with other respiratory manifestations: Secondary | ICD-10-CM

## 2021-06-03 DIAGNOSIS — R509 Fever, unspecified: Secondary | ICD-10-CM | POA: Diagnosis present

## 2021-06-03 DIAGNOSIS — H9203 Otalgia, bilateral: Secondary | ICD-10-CM | POA: Insufficient documentation

## 2021-06-03 DIAGNOSIS — J45909 Unspecified asthma, uncomplicated: Secondary | ICD-10-CM | POA: Insufficient documentation

## 2021-06-03 DIAGNOSIS — Z20822 Contact with and (suspected) exposure to covid-19: Secondary | ICD-10-CM | POA: Diagnosis not present

## 2021-06-03 LAB — RESP PANEL BY RT-PCR (FLU A&B, COVID) ARPGX2
Influenza A by PCR: POSITIVE — AB
Influenza B by PCR: NEGATIVE
SARS Coronavirus 2 by RT PCR: NEGATIVE

## 2021-06-03 MED ORDER — OSELTAMIVIR PHOSPHATE 75 MG PO CAPS: 75.0000 mg | ORAL_CAPSULE | Freq: Two times a day (BID) | ORAL | 0 refills | Status: DC

## 2021-06-03 MED ORDER — OSELTAMIVIR PHOSPHATE 75 MG PO CAPS: 75.0000 mg | ORAL_CAPSULE | Freq: Two times a day (BID) | ORAL | 0 refills | Status: AC

## 2021-06-03 NOTE — ED Triage Notes (Signed)
Pt reports fever, generalized body aches and fatigue x one week.  Oldest child dx w/ flu x 1 week ago.
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
## 2021-06-03 NOTE — Discharge Instructions (Signed)
Your flu test is positive for influenza A.  Recommend Motrin and Tylenol as needed as directed for fevers and body aches.  OTC cough medication such as Delsym is also permissible in adults.  There is a prescription for Tamiflu which she may take if you start today.  This medication has side effects such as vomiting and diarrhea.  Discontinue this medication if you experience undesired side effects.

## 2021-11-22 IMAGING — US US OB < 14 WEEKS - US OB TV
1 series · 15 of 28 positions shown · non-contrast
Comparison: None.

CLINICAL DATA: Cramping pelvic pain. Positive urine pregnancy test.

EXAM:
OBSTETRIC <14 WK US AND TRANSVAGINAL OB US
TECHNIQUE: Both transabdominal and transvaginal ultrasound examinations were
performed for complete evaluation of the gestation as well as the
maternal uterus, adnexal regions, and pelvic cul-de-sac.
Transvaginal technique was performed to assess early pregnancy.

[Series 1: us ob < 14 weeks - us ob tv · 46 acquisitions, 15 frames shown]
[im 1/46]
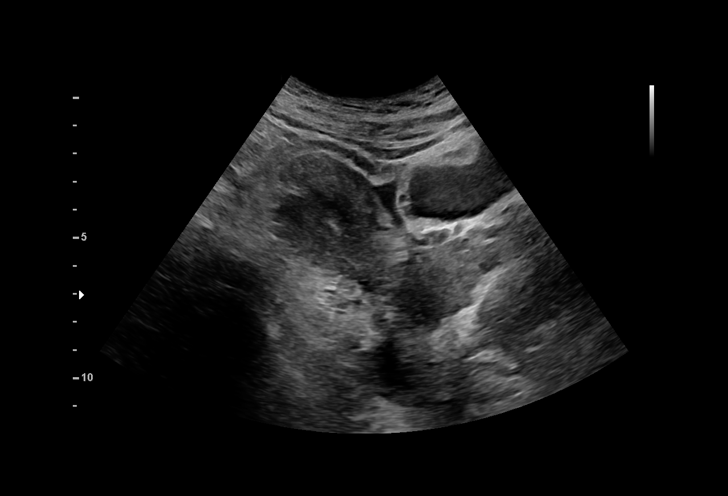
[im 4/46]
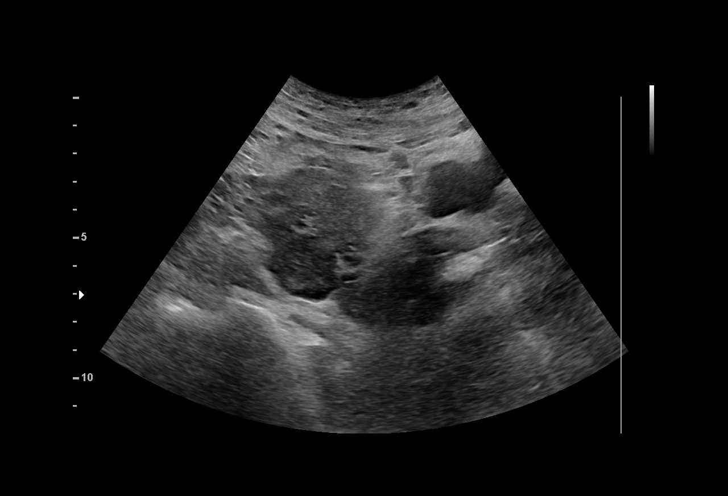
[im 7/46]
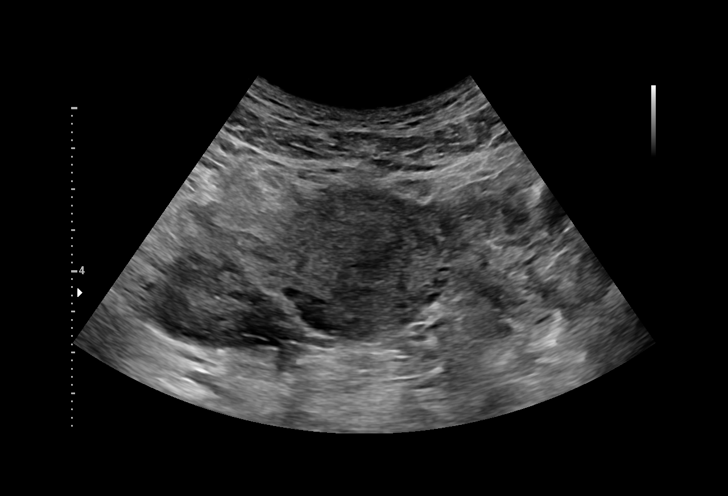
[im 11/46]
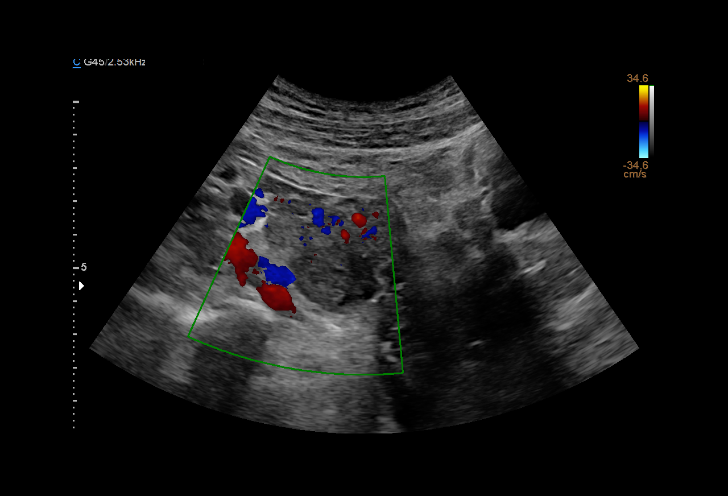
[im 14/46]
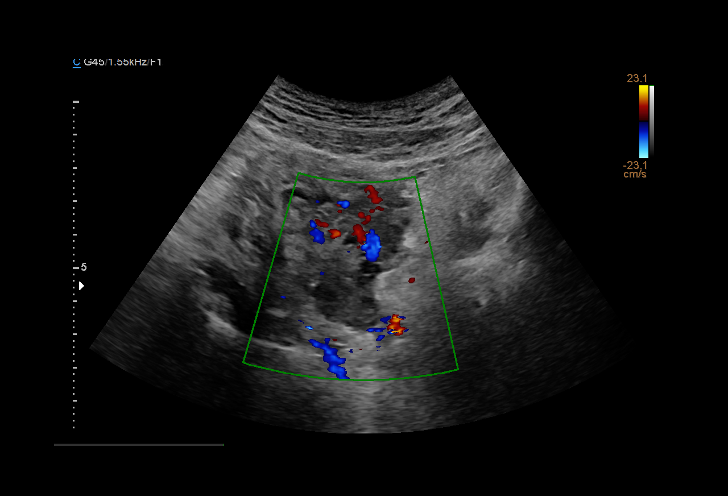
[im 17/46]
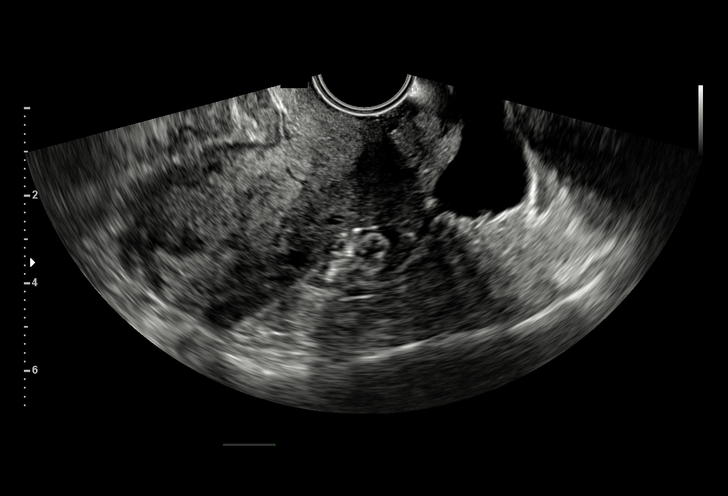
[im 21/46]
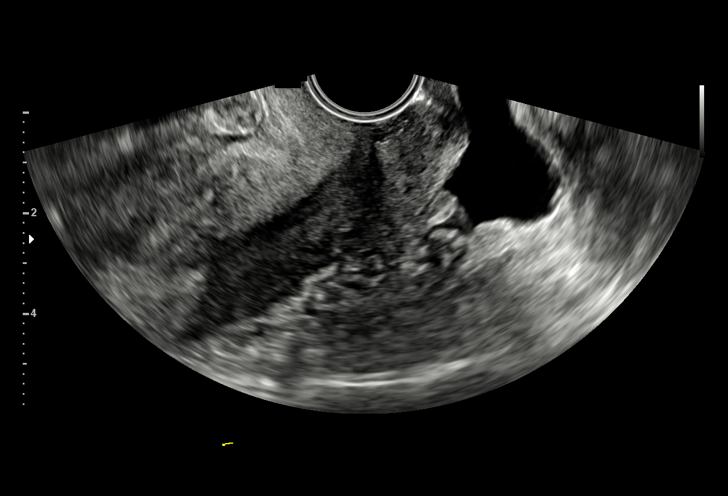
[im 24/46]
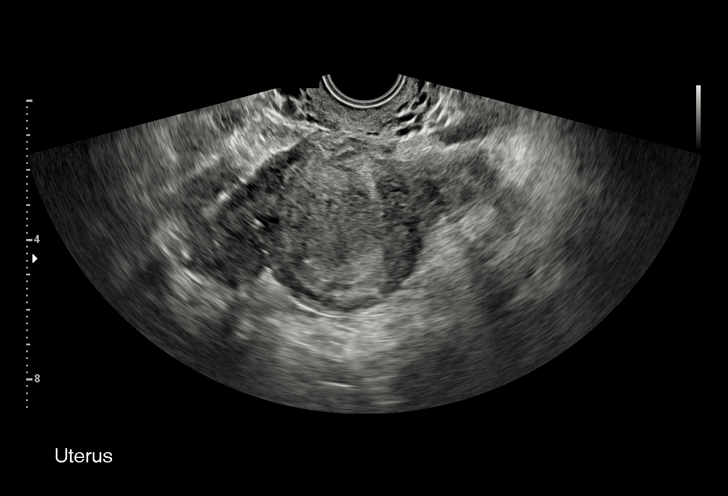
[im 26/46]
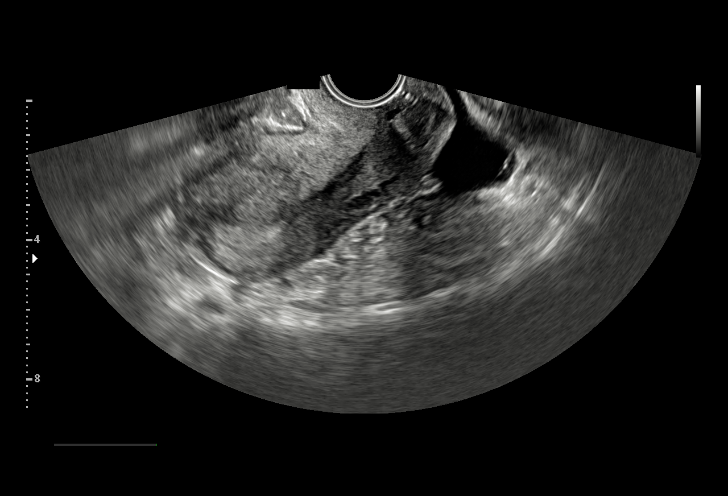
[im 29/46]
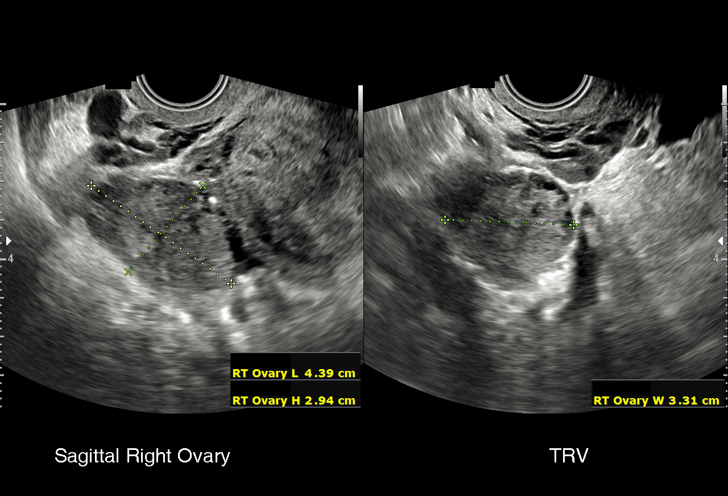
[im 32/46]
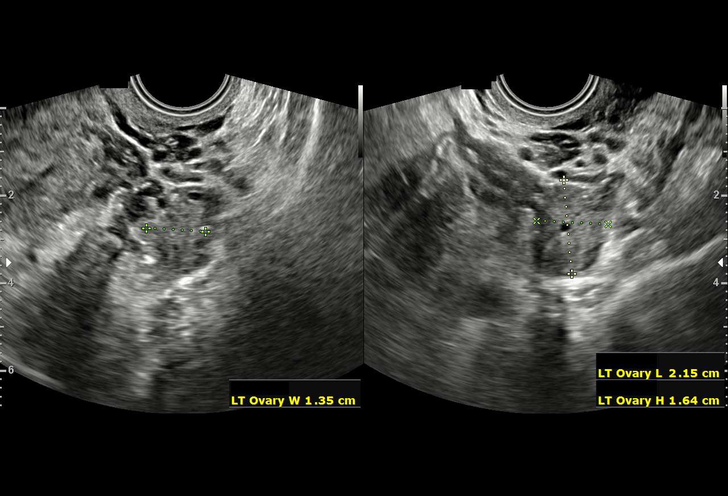
[im 36/46]
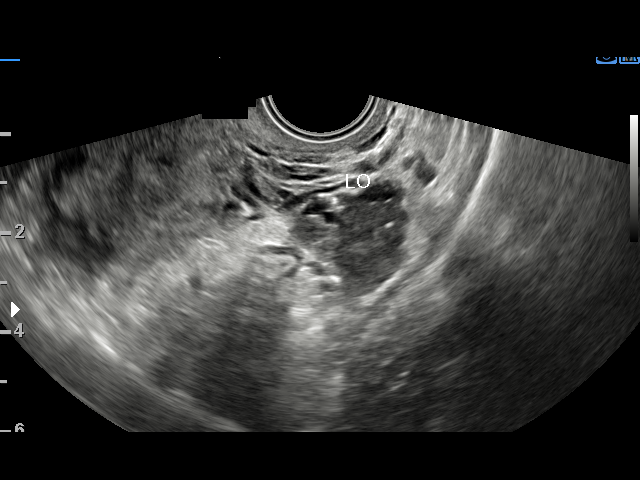
[im 39/46]
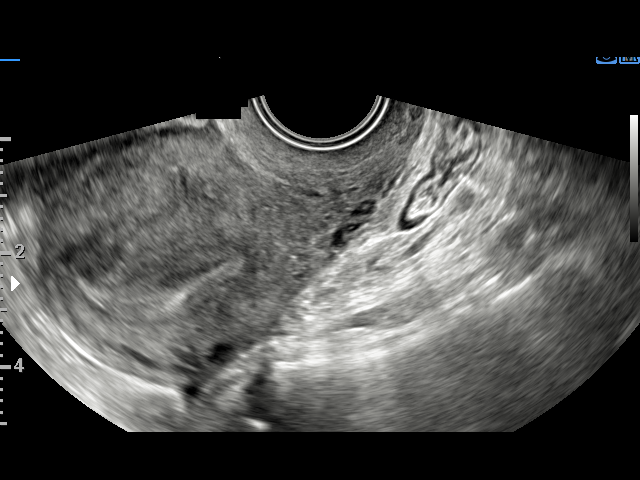
[im 42/46]
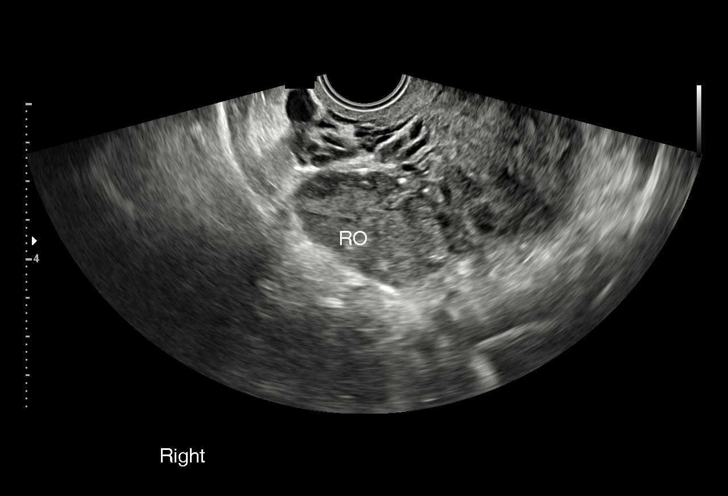
[im 46/46]
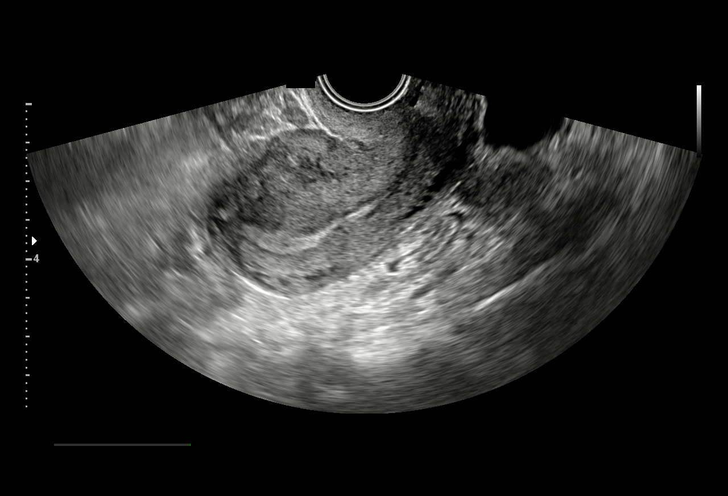

[15 of 28 positions shown; findings below may reference images not displayed]

FINDINGS: Intrauterine gestational sac: None

Yolk sac:  N/A

Embryo:  N/A

Cardiac Activity: N/A

Heart Rate: N/A bpm

Subchorionic hemorrhage:  N/A

Maternal uterus/adnexae: Both ovaries are normal.  No adnexal mass.

Small amount of free fluid in the cul-de-sac.
IMPRESSION: 1. No intrauterine gestational sac is identified. No endometrial
thickening or endometrial fluid.
2. Normal ovaries.
3. Small amount of free pelvic fluid.

## 2021-12-10 IMAGING — US US OB TRANSVAGINAL
1 series · 15 of 28 positions shown · non-contrast
Comparison: November 19, 2019.

CLINICAL DATA: Confirm fetal viability.

EXAM:
TRANSVAGINAL OB ULTRASOUND
TECHNIQUE: Transvaginal ultrasound was performed for complete evaluation of the
gestation as well as the maternal uterus, adnexal regions, and
pelvic cul-de-sac.

[Series 1: us ob transvaginal · 15 of 38 slices shown]
[im 1/38]
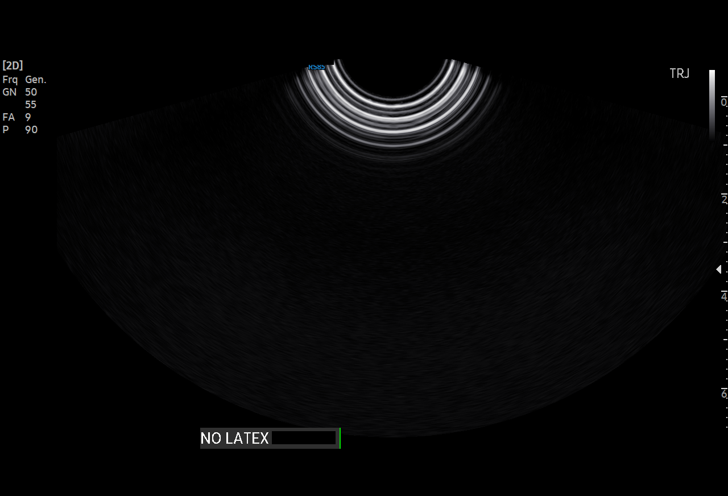
[im 3/38]
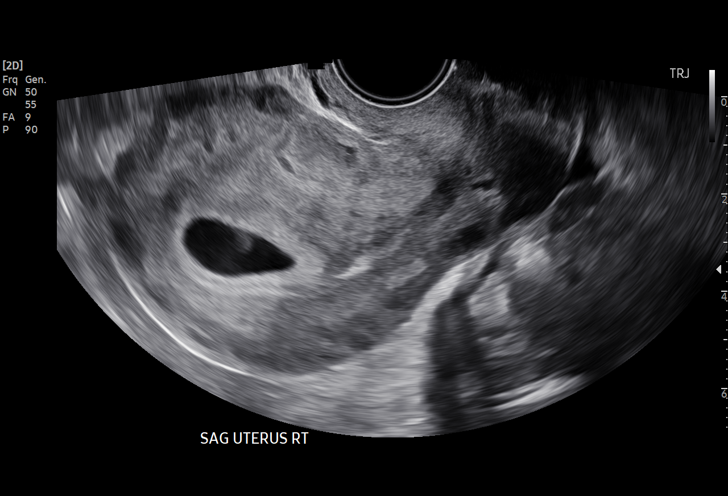
[im 6/38]
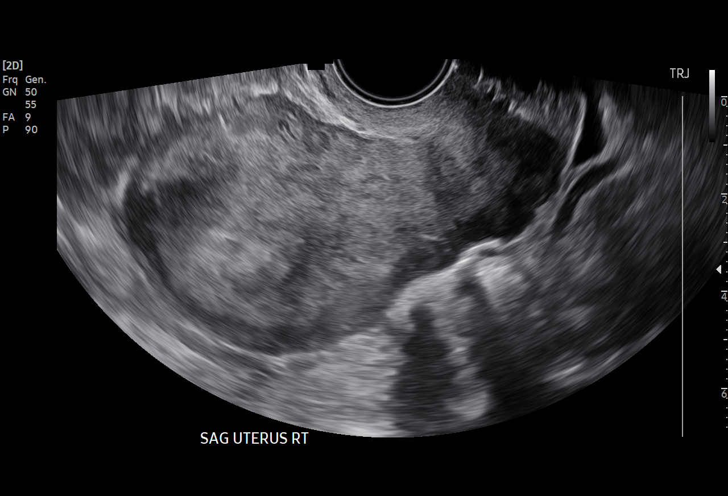
[im 9/38]
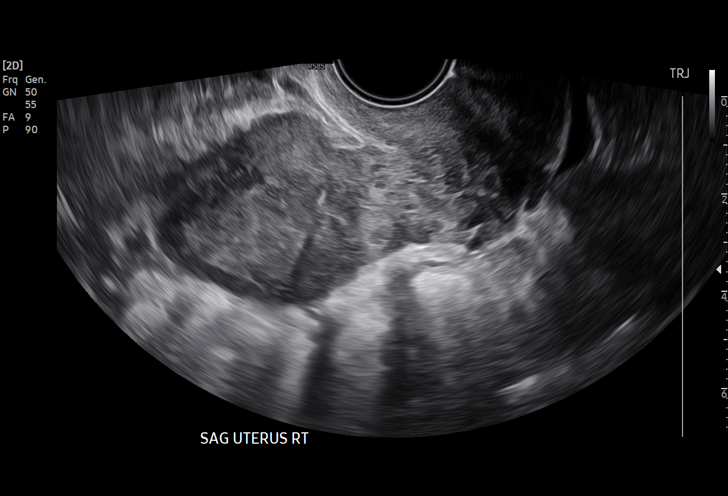
[im 11/38]
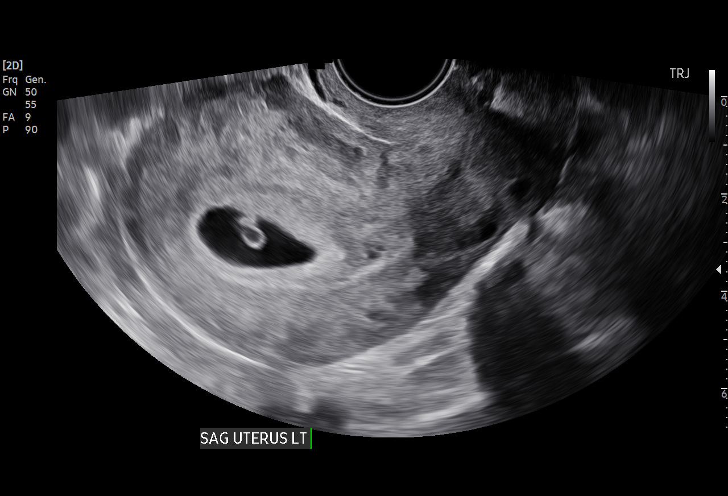
[im 14/38]
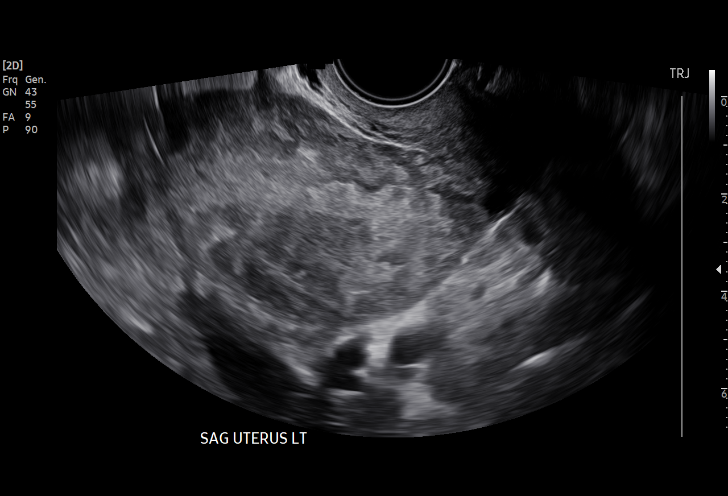
[im 17/38]
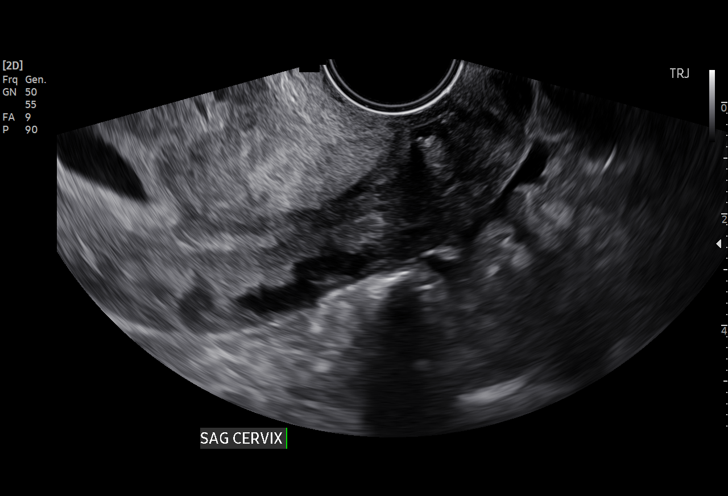
[im 20/38]
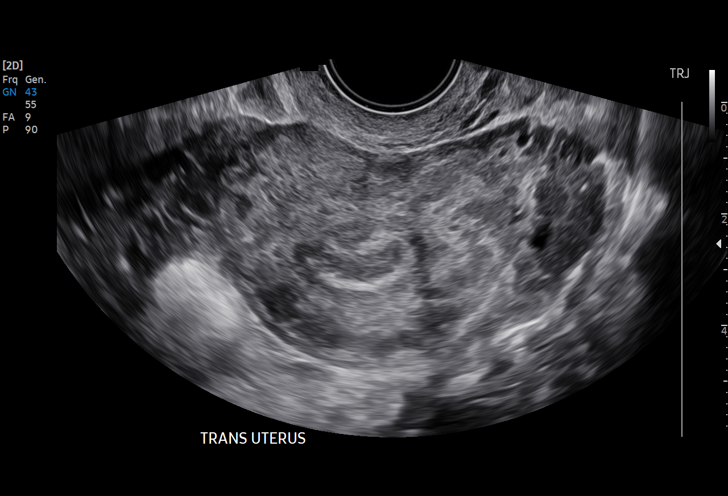
[im 21/38]
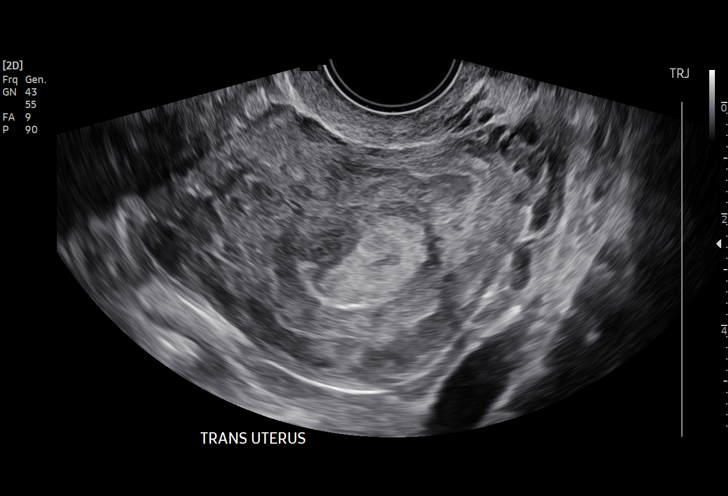
[im 24/38]
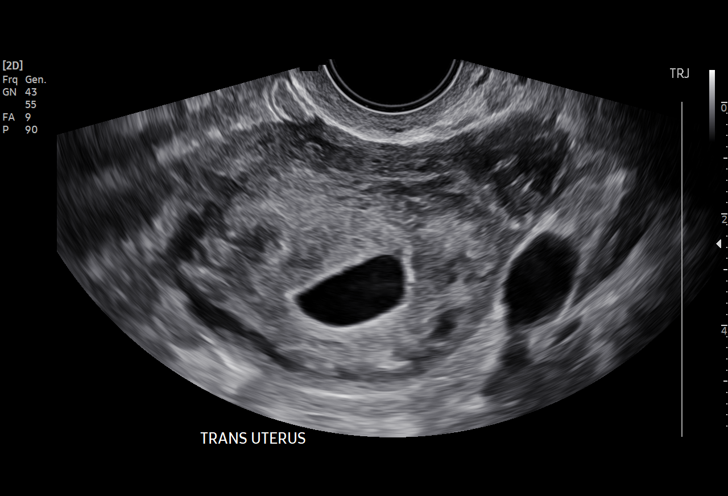
[im 27/38]
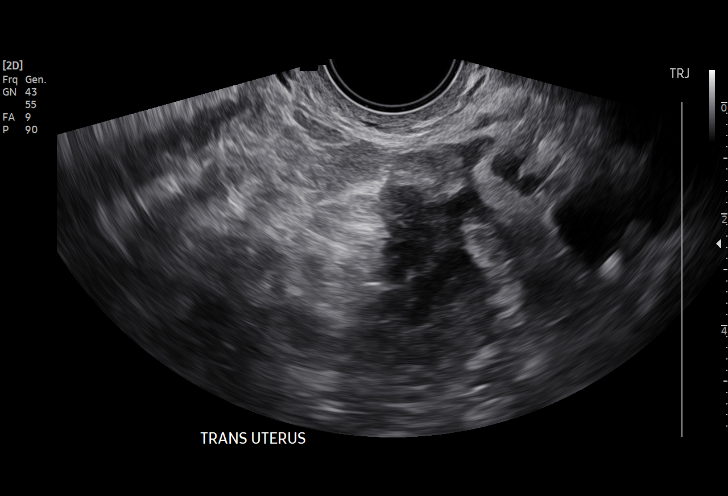
[im 29/38]
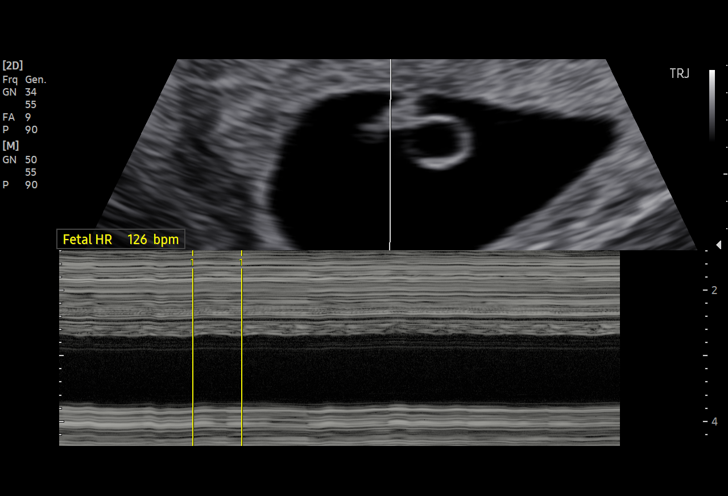
[im 32/38]
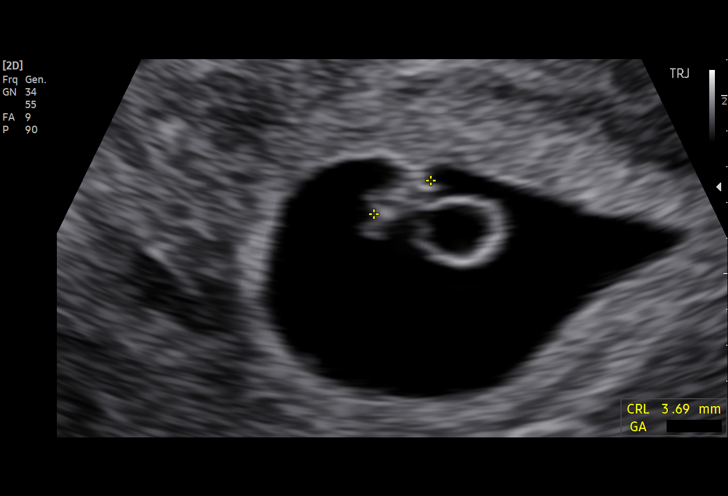
[im 35/38]
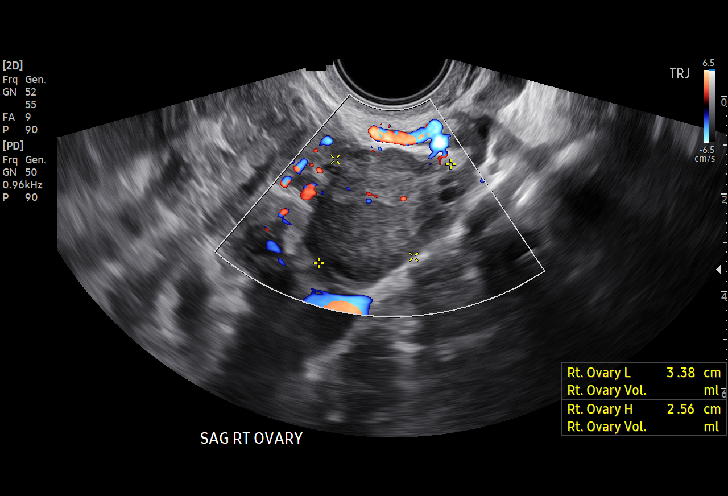
[im 38/38]
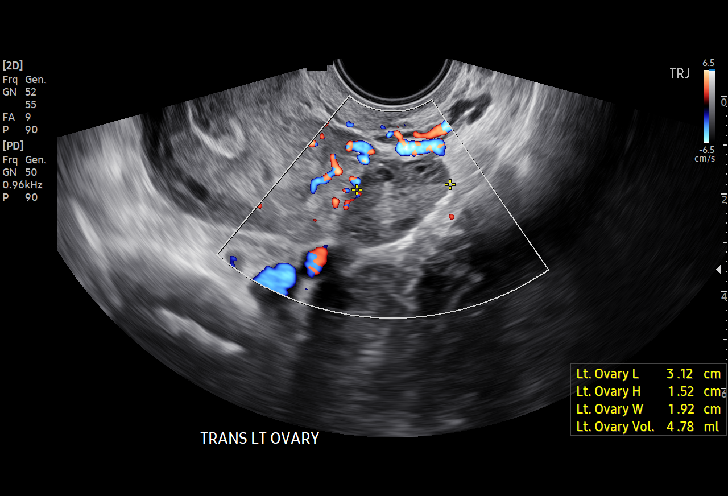

[15 of 28 positions shown; findings below may reference images not displayed]

FINDINGS: Intrauterine gestational sac: Single

Yolk sac:  Visualized.

Embryo:  Visualized.

Cardiac Activity: Visualized.

Heart Rate: 126 bpm

CRL:   4.0 mm   6 w 1 d                  US EDC: July 31, 2020.

Subchorionic hemorrhage:  None visualized.

Maternal uterus/adnexae: Probable corpus luteum cyst seen in right
ovary. Left ovary is normal. Trace free fluid is noted which most
likely is physiologic.
IMPRESSION: Single live intrauterine gestation of 6 weeks 1 day.

## 2022-03-18 ENCOUNTER — Ambulatory Visit
Admission: EM | Admit: 2022-03-18 | Discharge: 2022-03-18 | Disposition: A | Discharge status: Home / Self Care | Payer: BC Managed Care – PPO | Attending: Urgent Care | Admitting: Urgent Care

## 2022-03-18 ENCOUNTER — Ambulatory Visit: Payer: Self-pay

## 2022-03-18 ENCOUNTER — Encounter: Payer: Self-pay | Admitting: Emergency Medicine

## 2022-03-18 DIAGNOSIS — H109 Unspecified conjunctivitis: Secondary | ICD-10-CM | POA: Diagnosis not present

## 2022-03-18 MED ORDER — TOBRAMYCIN 0.3 % OP SOLN: 1.0000 [drp] | OPHTHALMIC | 0 refills | Status: DC

## 2022-03-18 NOTE — Discharge Instructions (Signed)
For today and tomorrow start using 1 eye drop every 2 hours and then switch to every 4 hours on Tuesday. Finish 1 week's worth of medication.

## 2022-03-18 NOTE — ED Provider Notes (Signed)
[facility]   MRN: [id number] DOB: 09-18-1990  Subjective:   Kristin Lyons is a 31 y.o. female presenting for 2-day history of acute onset redness and irritation of the right eye.  Patient noticed that she recently changed her contacts but was also rubbing her eye a lot prior to this starting.  Her son recently was treated for pinkeye.  The other children do not have the symptoms.  No vision change, eyelid pain or swelling.  No current facility-administered medications for this encounter.  Current Outpatient Medications:    acetaminophen (TYLENOL) 325 MG tablet, Take 650 mg by mouth every 6 (six) hours as needed for mild pain or headache., Disp: , Rfl:    Blood Pressure Monitoring (ADULT BLOOD PRESSURE CUFF LG) KIT, To monitor BP at home, Disp: 1 kit, Rfl: 0   HYDROcodone-acetaminophen (NORCO/VICODIN) 5-325 MG tablet, Take 1-2 tablets by mouth every 6 (six) hours as needed for moderate pain., Disp: 30 tablet, Rfl: 0   ibuprofen (ADVIL) 800 MG tablet, Take 1 tablet (800 mg total) by mouth every 8 (eight) hours., Disp: 60 tablet, Rfl: 0   NIFEdipine (PROCARDIA-XL/NIFEDICAL-XL) 30 MG 24 hr tablet, Take 1 tablet (30 mg total) by mouth daily., Disp: 30 tablet, Rfl: 2   Prenatal Vit-Fe Fumarate-FA (PRENATAL MULTIVITAMIN) TABS tablet, Take 1 tablet by mouth daily at 12 noon., Disp: , Rfl:    Allergies  Allergen Reactions   Shellfish Allergy Hives    Past Medical History:  Diagnosis Date   Asthma    Asthma    Bacterial infection    History of chicken pox    Trichomonas    Yeast infection      Past Surgical History:  Procedure Laterality Date   DIAGNOSTIC LAPAROSCOPY WITH REMOVAL OF ECTOPIC PREGNANCY N/A 01/17/2021   Procedure: DIAGNOSTIC LAPAROSCOPY WITH REMOVAL OF left ECTOPIC PREGNANCY;  Surgeon: Sanjuana Kava, MD;  Location: [facility];  Service: Gynecology;  Laterality: N/A;   DILATION AND CURETTAGE OF UTERUS     DILATION AND CURETTAGE OF UTERUS N/A  09/17/2014   Procedure: DILATATION AND CURETTAGE;  Surgeon: Delice Lesch, MD;  Location: [facility];  Service: Gynecology;  Laterality: N/A;   DILATION AND EVACUATION N/A 09/17/2014   Procedure: DILATATION AND EVACUATION;  Surgeon: Delice Lesch, MD;  Location: [facility];  Service: Gynecology;  Laterality: N/A;   LAPAROSCOPY N/A 03/18/2017   Procedure: LAPAROSCOPY OPERATIVE WITH REMOVAL LEFT ECTOPIC PREGNANCY;  Surgeon: Delsa Bern, MD;  Location: [facility];  Service: Gynecology;  Laterality: N/A;   ORIF ORBITAL FRACTURE Left 06/13/2016   Procedure: OPEN REDUCTION INTERNAL FIXATION (ORIF) ORBITAL FRACTURE;  Surgeon: Loel Lofty Dillingham, DO;  Location: [facility];  Service: Plastics;  Laterality: Left;    Family History  Problem Relation Age of Onset   Hypertension Maternal Uncle    Hypertension Paternal Uncle    Asthma Mother    Healthy Father     Social History   Tobacco Use   Smoking status: Never   Smokeless tobacco: Never  Vaping Use   Vaping Use: Never used  Substance Use Topics   Alcohol use: No   Drug use: No    ROS   Objective:   Vitals: BP 118/62   Pulse 79   Temp 98.5 F (36.9 C)   Resp 20   SpO2 98%   Breastfeeding No   Physical Exam Constitutional:      General: She is not in acute distress.  Appearance: Normal appearance. She is well-developed. She is not ill-appearing, toxic-appearing or diaphoretic.  HENT:     Head: Normocephalic and atraumatic.     Nose: Nose normal.     Mouth/Throat:     Mouth: Mucous membranes are moist.  Eyes:     General: Lids are normal. Lids are everted, no foreign bodies appreciated. Vision grossly intact. No scleral icterus.       Right eye: No foreign body, discharge or hordeolum.        Left eye: No foreign body, discharge or hordeolum.     Extraocular Movements: Extraocular movements intact.     Right eye: Normal extraocular motion.     Left eye: Normal extraocular motion and no nystagmus.     Conjunctiva/sclera:      Right eye: Right conjunctiva is injected. No chemosis, exudate or hemorrhage.    Left eye: Left conjunctiva is not injected. No chemosis, exudate or hemorrhage. Cardiovascular:     Rate and Rhythm: Normal rate.  Pulmonary:     Effort: Pulmonary effort is normal.  Skin:    General: Skin is warm and dry.  Neurological:     General: No focal deficit present.     Mental Status: She is alert and oriented to person, place, and time.  Psychiatric:        Mood and Affect: Mood normal.        Behavior: Behavior normal.     Assessment and Plan :   PDMP not reviewed this encounter.  1. Bacterial conjunctivitis of right eye     Will start tobramycin to address bacterial conjunctivitis of right eye. Counseled patient on potential for adverse effects with medications prescribed/recommended today, ER and return-to-clinic precautions discussed, patient verbalized understanding.    Jaynee Eagles, PA-C 03/18/22 1110

## 2022-03-18 NOTE — ED Triage Notes (Signed)
Pt here with right eye pain and redness x 2 days. Pt noticed this after she changed her contacts.

## 2022-07-30 NOTE — L&D Delivery Note (Signed)
Delivery Note At 7:47 AM a viable female was delivered via Vaginal, Spontaneous (Presentation: Right Occiput Anterior).  APGAR: 9, 9; weight  pending. No nuchal cord noted.  Shoulders were delivered without difficulty, Left shoulder was anterior.  Upon delivery, baby was placed on mother's abdomen where it was dried and stimulated.  Pitocin was started at this point.  Baby had a spontaneous cry and good tone noted.  Cord was clamped after 1 minute. Cord cut by provider.  Placenta delivered with gentle traction on cord and abdominal counter traction. Placenta status: Spontaneous, Intact.  Cord: 3 vessels with the following complications: None.  Cord pH: not collected.  Anesthesia: Epidural Episiotomy: None Lacerations: None Suture Repair:  N/A Est. Blood Loss (mL): 120  Mom to postpartum.  Baby to Couplet care / Skin to Skin. Prescilla Sours, MD.  05/06/2023, 9:00 AM

## 2022-07-31 ENCOUNTER — Ambulatory Visit
Admission: EM | Admit: 2022-07-31 | Discharge: 2022-07-31 | Disposition: A | Discharge status: Home / Self Care | Payer: BC Managed Care – PPO | Attending: Family Medicine | Admitting: Family Medicine

## 2022-07-31 DIAGNOSIS — S99922A Unspecified injury of left foot, initial encounter: Secondary | ICD-10-CM | POA: Diagnosis not present
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
## 2022-07-31 NOTE — ED Triage Notes (Addendum)
Pt reports left 3rd toe injury ~12am-states the nail is partially intact-NAD

## 2022-07-31 NOTE — Discharge Instructions (Addendum)
Clean the nailbed 2 times daily with peroxide or soapy water.  Apply new Neosporin and a Band-Aid  Ice and elevation can help feel better today  Tylenol or Advil as needed for any discomfort.

## 2022-09-26 ENCOUNTER — Inpatient Hospital Stay (HOSPITAL_COMMUNITY)
Admission: AD | Admit: 2022-09-26 | Discharge: 2022-09-26 | Disposition: A | Discharge status: Home / Self Care | Payer: BC Managed Care – PPO | Attending: Obstetrics & Gynecology | Admitting: Obstetrics & Gynecology

## 2022-09-26 ENCOUNTER — Encounter (HOSPITAL_COMMUNITY): Payer: Self-pay | Admitting: Obstetrics & Gynecology

## 2022-09-26 ENCOUNTER — Inpatient Hospital Stay (HOSPITAL_COMMUNITY): Payer: BC Managed Care – PPO

## 2022-09-26 DIAGNOSIS — O26891 Other specified pregnancy related conditions, first trimester: Secondary | ICD-10-CM | POA: Insufficient documentation

## 2022-09-26 DIAGNOSIS — O26859 Spotting complicating pregnancy, unspecified trimester: Secondary | ICD-10-CM

## 2022-09-26 DIAGNOSIS — R102 Pelvic and perineal pain: Secondary | ICD-10-CM | POA: Insufficient documentation

## 2022-09-26 DIAGNOSIS — Z3490 Encounter for supervision of normal pregnancy, unspecified, unspecified trimester: Secondary | ICD-10-CM

## 2022-09-26 DIAGNOSIS — O09291 Supervision of pregnancy with other poor reproductive or obstetric history, first trimester: Secondary | ICD-10-CM | POA: Insufficient documentation

## 2022-09-26 DIAGNOSIS — O209 Hemorrhage in early pregnancy, unspecified: Secondary | ICD-10-CM | POA: Insufficient documentation

## 2022-09-26 DIAGNOSIS — O26851 Spotting complicating pregnancy, first trimester: Secondary | ICD-10-CM | POA: Diagnosis not present

## 2022-09-26 DIAGNOSIS — Z3A01 Less than 8 weeks gestation of pregnancy: Secondary | ICD-10-CM

## 2022-09-26 LAB — URINALYSIS, ROUTINE W REFLEX MICROSCOPIC
Bilirubin Urine: NEGATIVE
Glucose, UA: NEGATIVE mg/dL
Hgb urine dipstick: NEGATIVE
Ketones, ur: NEGATIVE mg/dL
Leukocytes,Ua: NEGATIVE
Nitrite: NEGATIVE
Protein, ur: NEGATIVE mg/dL
Specific Gravity, Urine: 1.019 (ref 1.005–1.030)
pH: 6 (ref 5.0–8.0)

## 2022-09-26 LAB — POCT PREGNANCY, URINE: Preg Test, Ur: POSITIVE — AB

## 2022-09-26 LAB — HCG, QUANTITATIVE, PREGNANCY: hCG, Beta Chain, Quant, S: 8956 m[IU]/mL — ABNORMAL HIGH (ref <5)

## 2022-09-26 NOTE — MAU Provider Note (Signed)
History     CSN: [id number]  Arrival date and time: 09/26/22 1533   Event Date/Time   First Provider Initiated Contact with Patient 09/26/22 1611      Chief Complaint  Patient presents with   Pelvic Pain   Vaginal Bleeding   Kristin Lyons , a  32 y.o. [id number] at 86w2dpresents to MAU with complaints of vaginal spotting and lower pelvic cramping. Patient reports bleeding and spotting on Monday, but denies any now. Denies saturating a pad and denies passing clots. She states she was also having 4/10 cramping then that was relieved with PO Tylenol. She reports that pain is intermittent and waxes and wanes. Currently rating pain a 0/10. [facility] but was unable to get an [place] was recommended to report to MAU. She denies abnormal vaginal discharge and urinary symptoms.   Patient hx includes ectopic pregnancy x2 on left side, last one resulting in a salpingectomy.          OB History     Gravida  9   Para  4   Term  3   Preterm  0   AB  3   Living  3      SAB  2   IAB  0   Ectopic  1   Multiple  0   Live Births  3           Past Medical History:  Diagnosis Date   Asthma    Asthma    Bacterial infection    History of chicken pox    Trichomonas    Yeast infection     Past Surgical History:  Procedure Laterality Date   DIAGNOSTIC LAPAROSCOPY WITH REMOVAL OF ECTOPIC PREGNANCY N/A 01/17/2021   Procedure: DIAGNOSTIC LAPAROSCOPY WITH REMOVAL OF left ECTOPIC PREGNANCY;  Surgeon: PSanjuana Kava MD;  [facility]  Service: Gynecology;  Laterality: N/A;   DILATION AND CURETTAGE OF UTERUS     DILATION AND CURETTAGE OF UTERUS N/A 09/17/2014   Procedure: DILATATION AND CURETTAGE;  Surgeon: ADelice Lesch MD;  [facility];  Service: Gynecology;  Laterality: N/A;   DILATION AND EVACUATION N/A 09/17/2014   Procedure: DILATATION AND EVACUATION;  Surgeon: ADelice Lesch MD;  [facility];  Service:  Gynecology;  Laterality: N/A;   LAPAROSCOPY N/A 03/18/2017   Procedure: LAPAROSCOPY OPERATIVE WITH REMOVAL LEFT ECTOPIC PREGNANCY;  Surgeon: RDelsa Bern MD;  [facility];  Service: Gynecology;  Laterality: N/A;   ORIF ORBITAL FRACTURE Left 06/13/2016   Procedure: OPEN REDUCTION INTERNAL FIXATION (ORIF) ORBITAL FRACTURE;  Surgeon: CLoel LoftyDillingham, DO;  [facility]  Service: Plastics;  Laterality: Left;    Family History  Problem Relation Age of Onset   Hypertension Maternal Uncle    Hypertension Paternal Uncle    Asthma Mother    Healthy Father     Social History   Tobacco Use   Smoking status: Never   Smokeless tobacco: Never  Vaping Use   Vaping Use: Never used  Substance Use Topics   Alcohol use: No   Drug use: No    Allergies:  Allergies  Allergen Reactions   Shellfish Allergy Hives    Medications Prior to Admission  Medication Sig Dispense Refill Last Dose   Blood Pressure Monitoring (ADULT BLOOD PRESSURE CUFF LG) KIT To monitor BP at home 1 kit 0    NIFEdipine (PROCARDIA-XL/NIFEDICAL-XL) 30 MG 24 hr tablet Take  1 tablet (30 mg total) by mouth daily. 30 tablet 2    Prenatal Vit-Fe Fumarate-FA (PRENATAL MULTIVITAMIN) TABS tablet Take 1 tablet by mouth daily at 12 noon.       Review of Systems  Constitutional:  Negative for chills, fatigue and fever.  Eyes:  Negative for pain and visual disturbance.  Respiratory:  Negative for apnea, shortness of breath and wheezing.   Cardiovascular:  Negative for chest pain and palpitations.  Gastrointestinal:  Positive for abdominal pain. Negative for constipation, diarrhea, nausea and vomiting.  Genitourinary:  Positive for pelvic pain and vaginal bleeding. Negative for difficulty urinating, dysuria, vaginal discharge and vaginal pain.  Musculoskeletal:  Negative for back pain.  Neurological:  Negative for seizures, weakness and headaches.  Psychiatric/Behavioral:  Negative for suicidal ideas.    Physical Exam    Blood pressure (!) 129/53, pulse 76, temperature 99.2 F (37.3 C), temperature source Oral, resp. rate 19, height '5\' 5"'$  (1.651 m), weight 100.2 kg, last menstrual period 07/25/2022, SpO2 100 %.  Physical Exam Vitals and nursing note reviewed.  Constitutional:      General: She is not in acute distress.    Appearance: Normal appearance.  HENT:     Head: Normocephalic.  Cardiovascular:     Rate and Rhythm: Normal rate and regular rhythm.  Pulmonary:     Effort: Pulmonary effort is normal.  Musculoskeletal:     Cervical back: Normal range of motion.  Skin:    General: Skin is warm and dry.     Capillary Refill: Capillary refill takes less than 2 seconds.  Neurological:     Mental Status: She is alert and oriented to person, place, and time.  Psychiatric:        Mood and Affect: Mood normal.     MAU Course  Procedures Orders Placed This Encounter  Procedures   US OB LESS THAN 14 WEEKS WITH OB TRANSVAGINAL   Urinalysis, Routine w reflex microscopic -Urine, Clean Catch   hCG, quantitative, pregnancy   Pregnancy, urine POC   US OB LESS THAN 14 WEEKS WITH OB TRANSVAGINAL  Result Date: 09/26/2022 CLINICAL DATA:  Pelvic pain during pregnancy. EXAM: OBSTETRIC <14 WK [place] AND TRANSVAGINAL OB US TECHNIQUE: Both transabdominal and transvaginal ultrasound examinations were performed for complete evaluation of the gestation as well as the maternal uterus, adnexal regions, and pelvic cul-de-sac. Transvaginal technique was performed to assess early pregnancy. COMPARISON:  None Available. FINDINGS: Intrauterine gestational sac: Single Yolk sac:  Visualized. Embryo:  Not Visualized. Cardiac Activity: Not Visualized. MSD: 7.3 mm   5 w   3 d Subchorionic hemorrhage:  None visualized. Maternal uterus/adnexae: Bilateral ovaries are visualized and appear within normal limits. There is no pelvic free fluid. IMPRESSION: 1. Single intrauterine gestational sac containing yolk sac only corresponding to  gestational age of [redacted] weeks and 3 days by mean gestational sac diameter. There is no fetal pole identified at this time which is within normal limits for a sac of this size. A short-term follow-up ultrasound is recommended to confirm viability. 2. No acute abnormality. Electronically Signed   By: Ronney Asters M.D.   On: 09/26/2022 16:54     MDM - US revealed a single IUP measuring ~[redacted]w[redacted]d Low suspicion for ectopic pregnancy.  - UA and HCG still pending.  - Reviewed results of [place] patient and patient is relieved. She desires to wait at home for remainder of results. CNM agreeable to plan of care. Will notify patient of concerning results.  -  Plan for discharge.   Assessment and Plan   1. Intrauterine pregnancy   2. Spotting in early pregnancy   3. [redacted] weeks gestation of pregnancy    - Discussed that this is a IUP and that spotting and cramping in early pregnancy can be normal.  - Worsening signs and return precautions reviewed.  - UA and Hcg pending upon discharge. Will notify patient of concerning results.  - Patient discharged home in stable condition and may return to MAU as needed.    Jacquiline Doe, MSN CNM  09/26/2022, 4:11 PM

## 2022-09-26 NOTE — MAU Note (Signed)
...  Kristin Lyons is a 32 y.o. at 12w2dhere in MAU reporting: Intermittent lower back pain that began this past Sunday and stopped this past Monday. Last IC this past Friday. Denies urinary symptoms. Denies vaginal discharge, vaginal itching, and vaginal odors.  Had a visit yesterday with [facility] due to the cramping and spotting. [name]   Next OB appointment 3/27.  LMP: 08/20/2022 Onset of complaint: 09/23/2026 Pain score: Denies current pain.   Lab orders placed from triage:  POCT Preg, UA

## 2022-11-15 LAB — OB RESULTS CONSOLE RPR: RPR: NONREACTIVE

## 2022-11-15 LAB — OB RESULTS CONSOLE RUBELLA ANTIBODY, IGM: Rubella: IMMUNE

## 2022-11-15 LAB — OB RESULTS CONSOLE HEPATITIS B SURFACE ANTIGEN: Hepatitis B Surface Ag: NEGATIVE

## 2022-11-15 LAB — OB RESULTS CONSOLE ANTIBODY SCREEN: Antibody Screen: NEGATIVE

## 2022-11-15 LAB — OB RESULTS CONSOLE HIV ANTIBODY (ROUTINE TESTING): HIV: NONREACTIVE

## 2022-11-15 LAB — HEPATITIS C ANTIBODY: HCV Ab: NEGATIVE

## 2022-12-03 LAB — OB RESULTS CONSOLE GC/CHLAMYDIA
Chlamydia: NEGATIVE
Neisseria Gonorrhea: NEGATIVE

## 2023-01-21 IMAGING — US US OB TRANSVAGINAL
1 series · 15 of 28 positions shown · non-contrast
Comparison: 12/07/2019.

CLINICAL DATA: Abdominal pain.  Methotrexate treatment.

EXAM:
TRANSVAGINAL OB ULTRASOUND
TECHNIQUE: Transvaginal ultrasound was performed for complete evaluation of the
gestation as well as the maternal uterus, adnexal regions, and
pelvic cul-de-sac.

[Series 1: us ob transvaginal · 35 acquisitions, 15 frames shown]
[im 1/35]
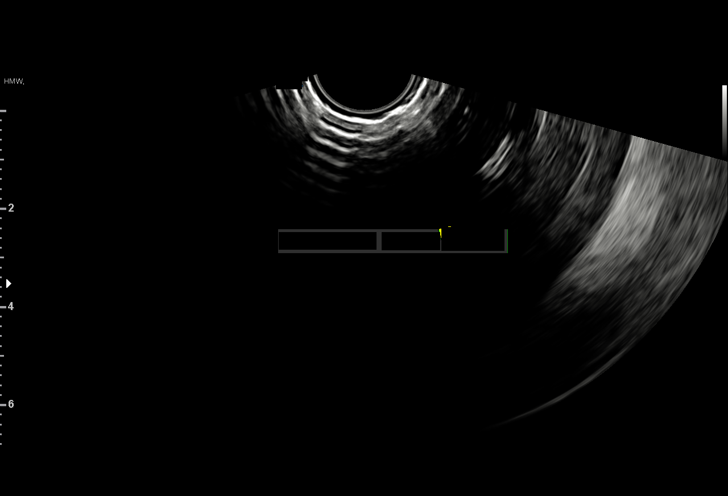
[im 3/35]
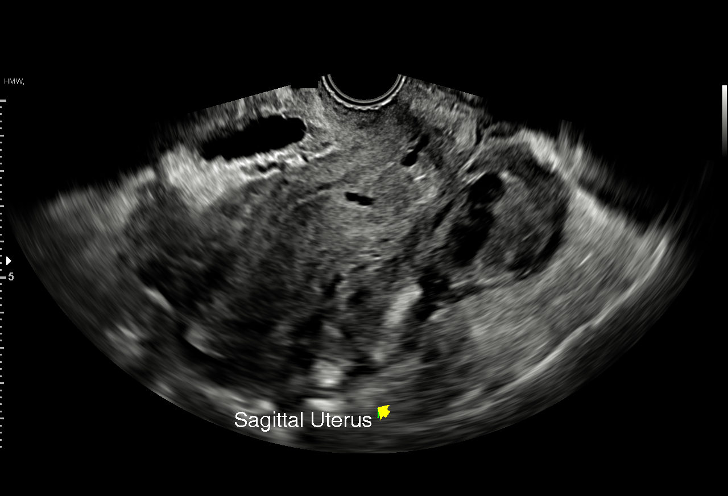
[im 6/35]
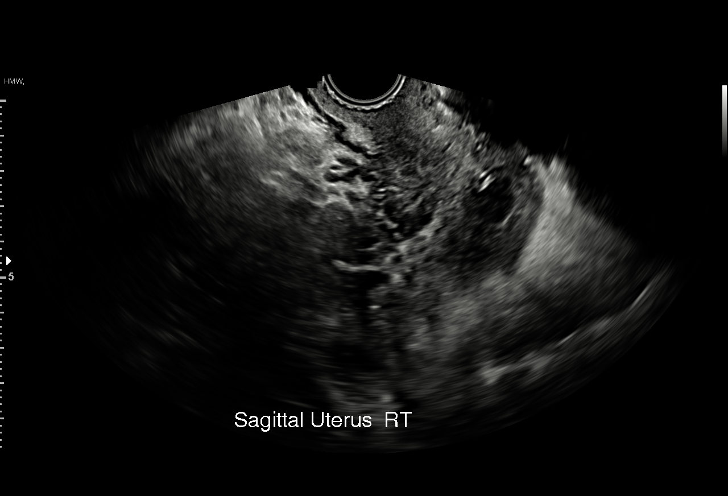
[im 8/35]
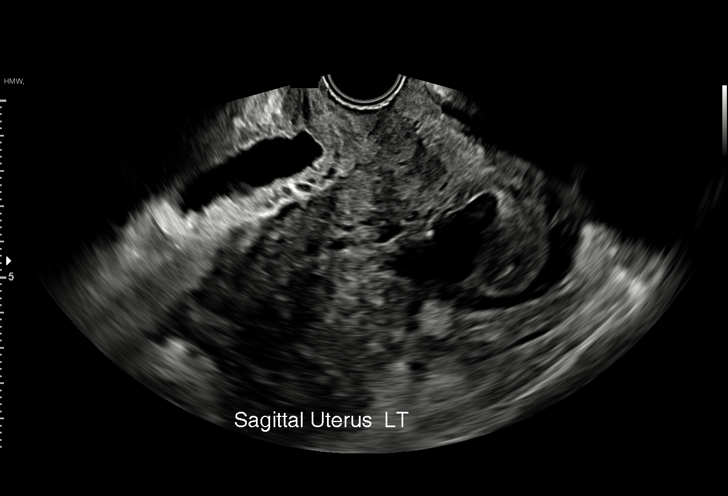
[im 11/35]
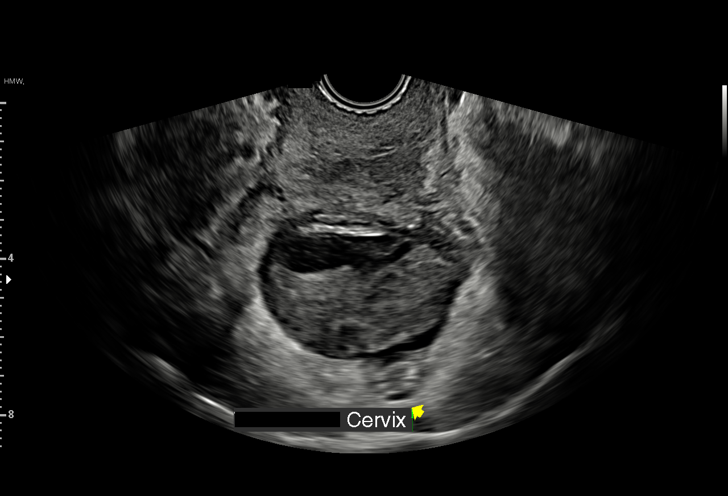
[im 13/35]
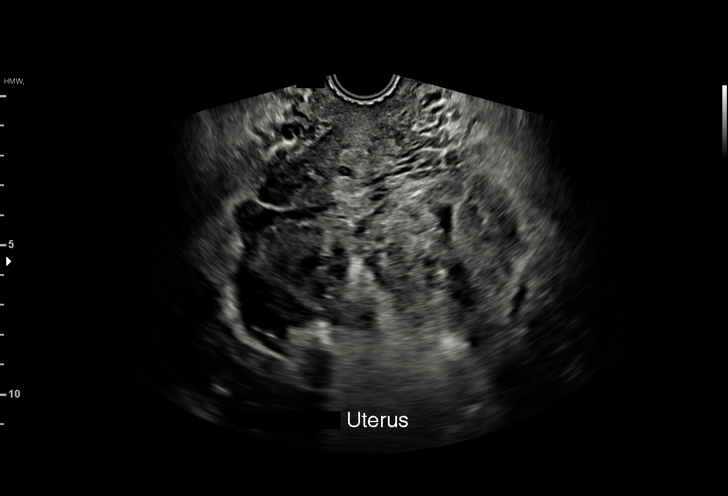
[im 16/35]
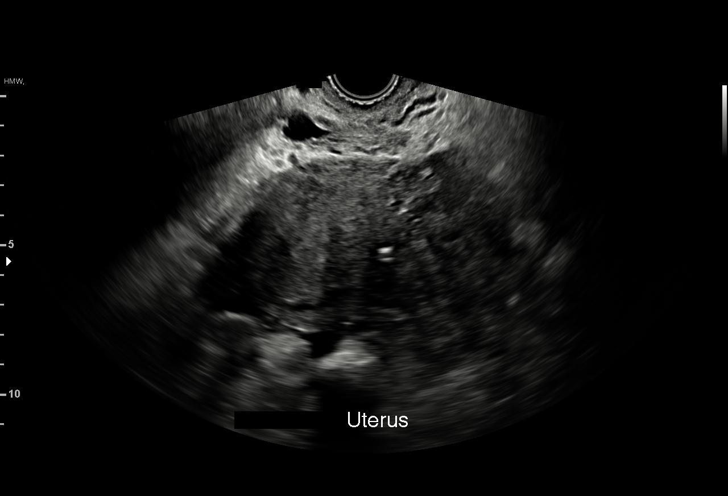
[im 18/35]
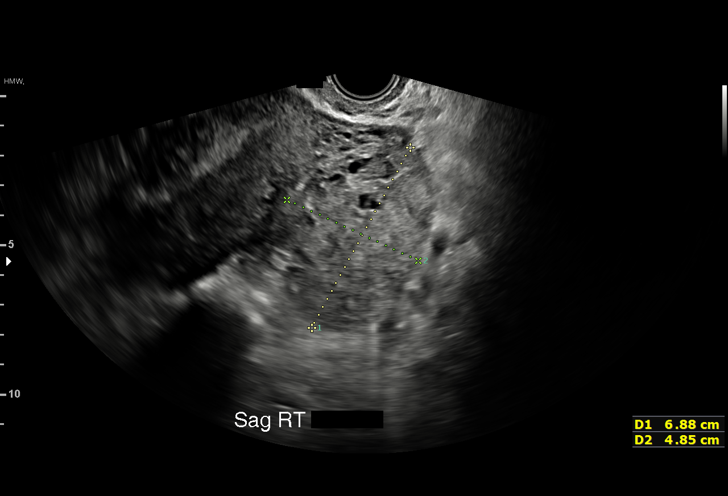
[im 19/35]
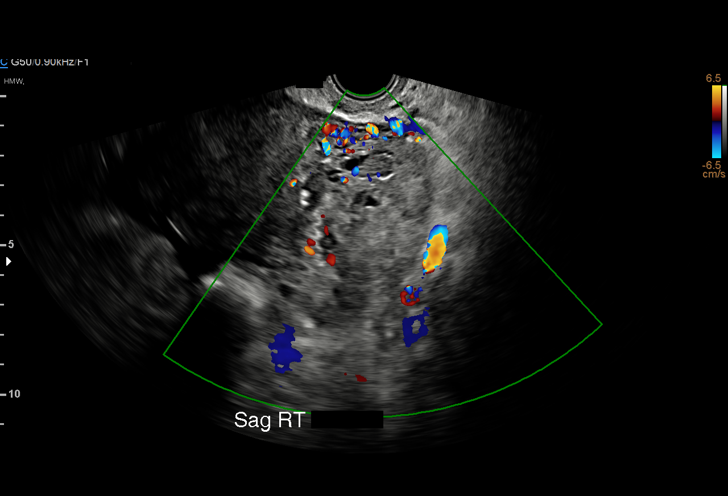
[im 22/35]
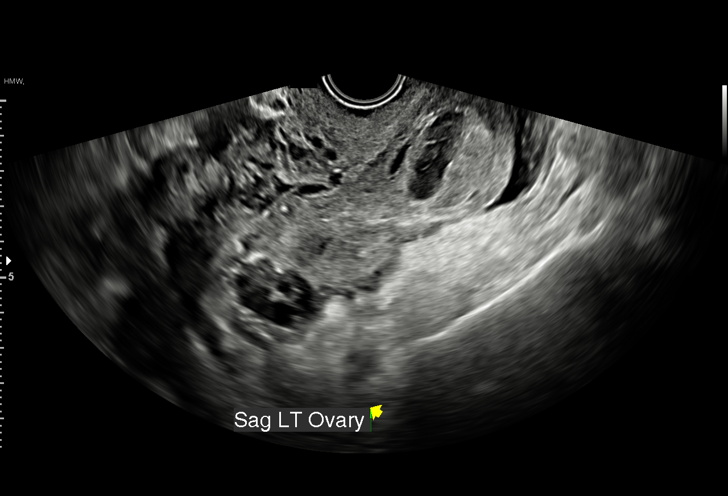
[im 24/35]
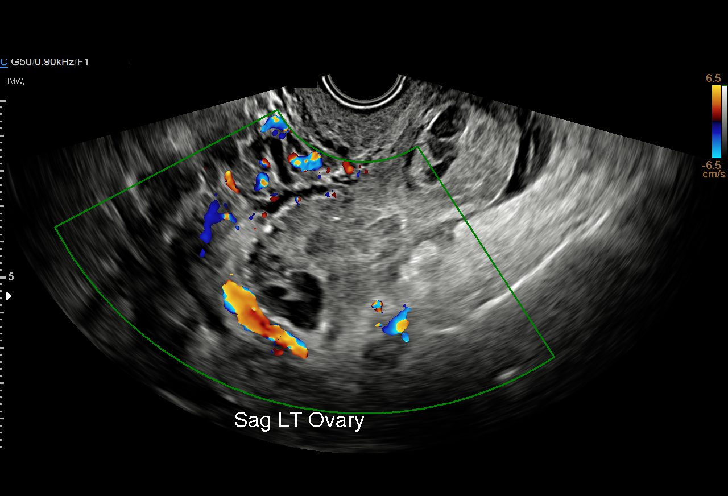
[im 27/35]
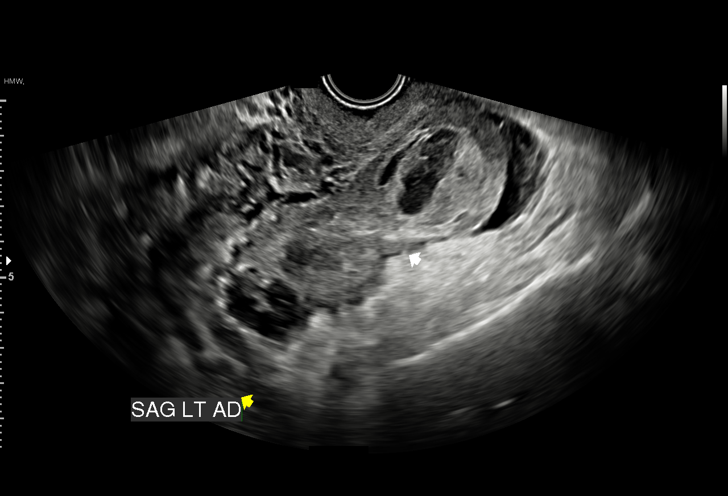
[im 29/35]
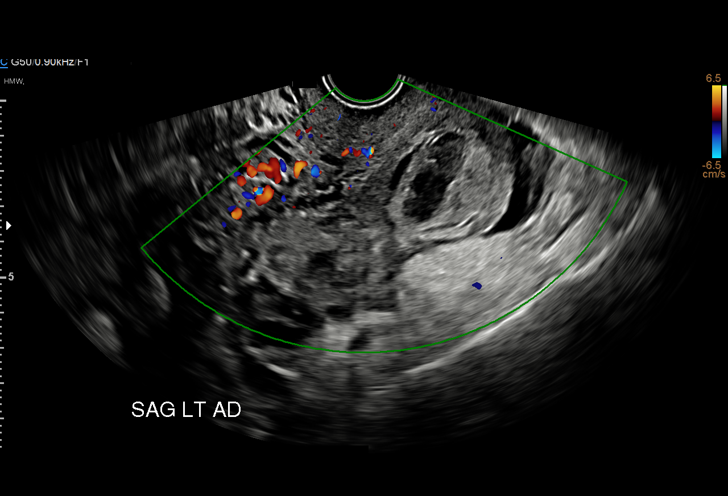
[im 32/35]
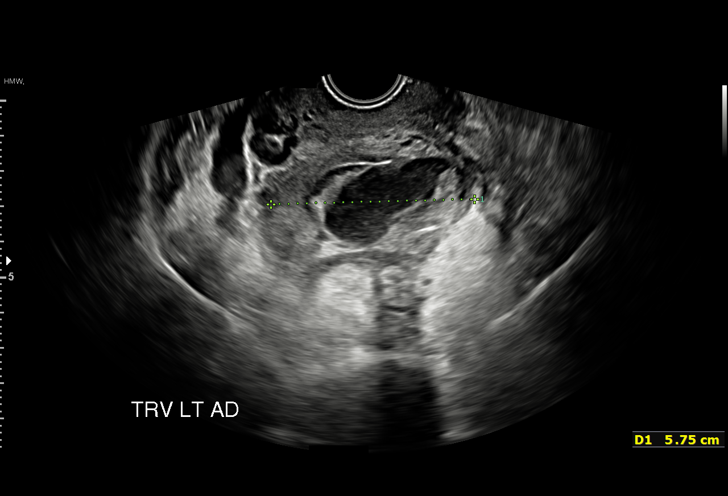
[im 35/35]
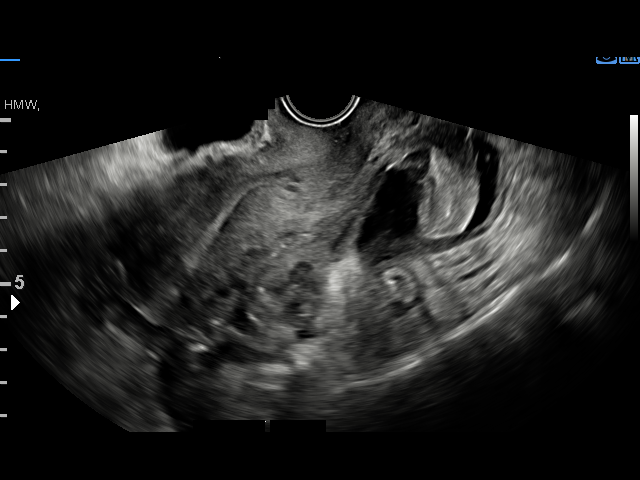

[15 of 28 positions shown; findings below may reference images not displayed]

FINDINGS: Intrauterine gestational sac: None visualized

Yolk sac:  None visualized

Embryo:  None visualized

Cardiac Activity: None visualized

Subchorionic hemorrhage:  None visualized

Maternal uterus/adnexae: A 7.7 x 3.3 x 5.8 cm complex heterogeneous
mass is noted over the left adnexa, possibly extending to the right
adnexa. This could represent an ectopic pregnancy. Other etiologies
of adnexal masses cannot be excluded. Small amount of free pelvic
fluid noted. This exam was limited due to patient's pain.
IMPRESSION: 1.  No intrauterine gestational sac or pregnancy noted.

2. A 7.7 x 3.3 x 5.8 cm complex heterogeneous mass is noted over the
left adnexa, possibly extending to the right adnexa. This could
represent an ectopic pregnancy. Other etiologies of adnexal masses
cannot be excluded. Small amount of free pelvic fluid.

Critical Value/emergent results were called by telephone at the time
of interpretation on 01/17/2021 at [DATE] to nurse Bironneau, who
verbally acknowledged these results.

## 2023-02-07 DIAGNOSIS — O99210 Obesity complicating pregnancy, unspecified trimester: Secondary | ICD-10-CM | POA: Insufficient documentation

## 2023-02-13 ENCOUNTER — Ambulatory Visit: Payer: BC Managed Care – PPO | Attending: Obstetrics and Gynecology

## 2023-02-13 ENCOUNTER — Other Ambulatory Visit: Payer: Self-pay | Admitting: *Deleted

## 2023-02-13 ENCOUNTER — Other Ambulatory Visit: Payer: Self-pay | Admitting: Obstetrics and Gynecology

## 2023-02-13 ENCOUNTER — Ambulatory Visit: Payer: BC Managed Care – PPO | Admitting: *Deleted

## 2023-02-13 ENCOUNTER — Ambulatory Visit: Payer: BC Managed Care – PPO

## 2023-02-13 ENCOUNTER — Encounter: Payer: Self-pay | Admitting: *Deleted

## 2023-02-13 VITALS — BP 134/63 | HR 90

## 2023-02-13 DIAGNOSIS — R638 Other symptoms and signs concerning food and fluid intake: Secondary | ICD-10-CM

## 2023-02-13 DIAGNOSIS — Z8759 Personal history of other complications of pregnancy, childbirth and the puerperium: Secondary | ICD-10-CM

## 2023-02-13 DIAGNOSIS — Z363 Encounter for antenatal screening for malformations: Secondary | ICD-10-CM

## 2023-02-13 DIAGNOSIS — Z3A25 25 weeks gestation of pregnancy: Secondary | ICD-10-CM

## 2023-02-13 DIAGNOSIS — O99212 Obesity complicating pregnancy, second trimester: Secondary | ICD-10-CM | POA: Insufficient documentation

## 2023-02-15 LAB — BETA-2-GLYCOPROTEIN I ABS, IGG/M/A
Beta-2 Glyco 1 IgA: <9 GPI IgA units (ref 0–25)
Beta-2 Glyco 1 IgM: <9 GPI IgM units (ref 0–32)
Beta-2 Glyco I IgG: <9 GPI IgG units (ref 0–20)

## 2023-02-15 LAB — LUPUS ANTICOAGULANT PANEL
Dilute Viper Venom Time: 29.1 s (ref 0.0–47.0)
PTT Lupus Anticoagulant: 44.8 s — ABNORMAL HIGH (ref 0.0–43.5)

## 2023-02-15 LAB — CARDIOLIPIN ANTIBODIES, IGM+IGG
Anticardiolipin IgG: <9 GPL U/mL (ref 0–14)
Anticardiolipin IgM: <9 MPL U/mL (ref 0–12)

## 2023-02-15 LAB — PTT-LA MIX: PTT-LA Mix: 44.2 s — ABNORMAL HIGH (ref 0.0–40.5)

## 2023-02-15 LAB — HEXAGONAL PHASE PHOSPHOLIPID: Hexagonal Phase Phospholipid: 5 s (ref 0–11)

## 2023-02-18 ENCOUNTER — Other Ambulatory Visit: Payer: Self-pay | Admitting: Maternal & Fetal Medicine

## 2023-02-18 NOTE — Progress Notes (Unsigned)
Lupus anticoagulant

## 2023-03-20 ENCOUNTER — Ambulatory Visit: Payer: BC Managed Care – PPO

## 2023-03-22 ENCOUNTER — Ambulatory Visit: Payer: BC Managed Care – PPO | Attending: Maternal & Fetal Medicine

## 2023-03-22 ENCOUNTER — Other Ambulatory Visit: Payer: Self-pay | Admitting: *Deleted

## 2023-03-22 ENCOUNTER — Ambulatory Visit: Payer: BC Managed Care – PPO | Admitting: *Deleted

## 2023-03-22 VITALS — BP 136/63 | HR 91

## 2023-03-22 DIAGNOSIS — O99213 Obesity complicating pregnancy, third trimester: Secondary | ICD-10-CM

## 2023-03-22 DIAGNOSIS — Z8759 Personal history of other complications of pregnancy, childbirth and the puerperium: Secondary | ICD-10-CM | POA: Diagnosis present

## 2023-03-22 DIAGNOSIS — O09293 Supervision of pregnancy with other poor reproductive or obstetric history, third trimester: Secondary | ICD-10-CM

## 2023-03-22 DIAGNOSIS — O403XX0 Polyhydramnios, third trimester, not applicable or unspecified: Secondary | ICD-10-CM | POA: Diagnosis not present

## 2023-03-22 DIAGNOSIS — Z3A30 30 weeks gestation of pregnancy: Secondary | ICD-10-CM

## 2023-03-22 DIAGNOSIS — O0943 Supervision of pregnancy with grand multiparity, third trimester: Secondary | ICD-10-CM

## 2023-03-22 DIAGNOSIS — E669 Obesity, unspecified: Secondary | ICD-10-CM

## 2023-04-19 ENCOUNTER — Inpatient Hospital Stay (HOSPITAL_COMMUNITY)
Admission: AD | Admit: 2023-04-19 | Discharge: 2023-04-19 | Disposition: A | Discharge status: Home / Self Care | Payer: BC Managed Care – PPO | Attending: Obstetrics & Gynecology | Admitting: Obstetrics & Gynecology

## 2023-04-19 ENCOUNTER — Encounter (HOSPITAL_COMMUNITY): Payer: Self-pay | Admitting: Obstetrics & Gynecology

## 2023-04-19 ENCOUNTER — Other Ambulatory Visit: Payer: Self-pay

## 2023-04-19 ENCOUNTER — Ambulatory Visit: Payer: BC Managed Care – PPO

## 2023-04-19 DIAGNOSIS — Z3A34 34 weeks gestation of pregnancy: Secondary | ICD-10-CM | POA: Diagnosis not present

## 2023-04-19 DIAGNOSIS — Z8759 Personal history of other complications of pregnancy, childbirth and the puerperium: Secondary | ICD-10-CM | POA: Insufficient documentation

## 2023-04-19 DIAGNOSIS — O403XX0 Polyhydramnios, third trimester, not applicable or unspecified: Secondary | ICD-10-CM

## 2023-04-19 DIAGNOSIS — E669 Obesity, unspecified: Secondary | ICD-10-CM

## 2023-04-19 DIAGNOSIS — O09293 Supervision of pregnancy with other poor reproductive or obstetric history, third trimester: Secondary | ICD-10-CM

## 2023-04-19 DIAGNOSIS — O0943 Supervision of pregnancy with grand multiparity, third trimester: Secondary | ICD-10-CM

## 2023-04-19 DIAGNOSIS — O10913 Unspecified pre-existing hypertension complicating pregnancy, third trimester: Secondary | ICD-10-CM | POA: Insufficient documentation

## 2023-04-19 DIAGNOSIS — O99213 Obesity complicating pregnancy, third trimester: Secondary | ICD-10-CM | POA: Insufficient documentation

## 2023-04-19 DIAGNOSIS — O26893 Other specified pregnancy related conditions, third trimester: Secondary | ICD-10-CM | POA: Insufficient documentation

## 2023-04-19 DIAGNOSIS — O133 Gestational [pregnancy-induced] hypertension without significant proteinuria, third trimester: Secondary | ICD-10-CM | POA: Diagnosis not present

## 2023-04-19 LAB — PROTEIN / CREATININE RATIO, URINE
Creatinine, Urine: 139 mg/dL
Protein Creatinine Ratio: 0.12 mg/mg{Cre} (ref 0.00–0.15)
Total Protein, Urine: 16 mg/dL

## 2023-04-19 LAB — CBC
HCT: 31.3 % — ABNORMAL LOW (ref 36.0–46.0)
Hemoglobin: 9.8 g/dL — ABNORMAL LOW (ref 12.0–15.0)
MCH: 25.5 pg — ABNORMAL LOW (ref 26.0–34.0)
MCHC: 31.3 g/dL (ref 30.0–36.0)
MCV: 81.5 fL (ref 80.0–100.0)
Platelets: 301 10*3/uL (ref 150–400)
RBC: 3.84 MIL/uL — ABNORMAL LOW (ref 3.87–5.11)
RDW: 15.6 % — ABNORMAL HIGH (ref 11.5–15.5)
WBC: 7.2 10*3/uL (ref 4.0–10.5)
nRBC: 0.3 % — ABNORMAL HIGH (ref 0.0–0.2)

## 2023-04-19 LAB — COMPREHENSIVE METABOLIC PANEL
ALT: 22 U/L (ref 0–44)
AST: 28 U/L (ref 15–41)
Albumin: 2.7 g/dL — ABNORMAL LOW (ref 3.5–5.0)
Alkaline Phosphatase: 128 U/L — ABNORMAL HIGH (ref 38–126)
Anion gap: 8 (ref 5–15)
BUN: <5 mg/dL — ABNORMAL LOW (ref 6–20)
CO2: 22 mmol/L (ref 22–32)
Calcium: 8.3 mg/dL — ABNORMAL LOW (ref 8.9–10.3)
Chloride: 105 mmol/L (ref 98–111)
Creatinine, Ser: 0.74 mg/dL (ref 0.44–1.00)
GFR, Estimated: >60 mL/min (ref >60)
Glucose, Bld: 120 mg/dL — ABNORMAL HIGH (ref 70–99)
Potassium: 3.4 mmol/L — ABNORMAL LOW (ref 3.5–5.1)
Sodium: 135 mmol/L (ref 135–145)
Total Bilirubin: 0.2 mg/dL — ABNORMAL LOW (ref 0.3–1.2)
Total Protein: 6.3 g/dL — ABNORMAL LOW (ref 6.5–8.1)

## 2023-04-19 MED ORDER — ACETAMINOPHEN-CAFFEINE 500-65 MG PO TABS
2.0000 | ORAL_TABLET | Freq: Once | ORAL | Status: AC
  Administered 2023-04-19: 2 via ORAL
  Filled 2023-04-19: qty 2

## 2023-04-19 MED ORDER — NIFEDIPINE ER OSMOTIC RELEASE 30 MG PO TB24: 30.0000 mg | ORAL_TABLET | Freq: Every day | ORAL | 1 refills | Status: AC

## 2023-04-19 NOTE — MAU Note (Signed)
MAILLE SCHEURING is a 32 y.o. at [redacted]w[redacted]d here in MAU reporting: sent from [facility] office for BP evaluation.  Endorses HA and blurred vision.  Reports took Tylenol today @ 1100, no relief noted.  Denies epigastric pain.  Endorses +FM, denied VB or LOF.   LMP: NA Onset of complaint: today Pain score: 7 Vitals:   04/19/23 1700  BP: (!) 141/74  Pulse: (!) 107  Resp: 18  Temp: 98.1 F (36.7 C)  SpO2: 98%     FHT:160 bpm Lab orders placed from triage:   UA

## 2023-04-19 NOTE — MA[facility] Provider Note (Signed)
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        Abdomen:                Previously seen  Nuchal Fold:           Not applicable (>20    Abdominal Wall:         Previously seen                         wks GA)  Face:                  Orbits and profile     Cord Vessels:           Previously seen                         previously seen   Lips:                  Previously seen        Kidneys:                Appear normal  Palate:                Not well visualized    Bladder:  Appears normal  Thoracic:              Previously seen        Spine:                  Previously seen  Heart:                 Previously seen        Upper Extremities:      Previously seen  RVOT:                  Previously seen        Lower Extremities:      Previously seen  Other:  Female gender previously seen. Heels/feet, VC, and 3VV prev          visualized. ---------------------------------------------------------------------- Cervix Uterus Adnexa  Cervix  Not visualized (advanced GA >24wks) ---------------------------------------------------------------------- Impression  Pregravid BMI 40.  Amniotic fluid is normal good fetal activity seen.  Fetal growth  is appropriate for gestational age. Polyhydramnios had  resolved.  Patient does not have gestational diabetes.  Blood pressure  today at our office is 136/63 mmHg.  We reassured the patient of the findings.  I discussed protocol of weekly antenatal testing from [redacted]  weeks gestation till delivery (pregravid BMI 40). ---------------------------------------------------------------------- Recommendations  -An appointment was made for her to return in 4 weeks for  fetal growth assessment and BPP and then weekly BPP till  delivery. ----------------------------------------------------------------------                 Noralee Space, MD Electronically Signed Final Report   03/22/2023 01:21 pm ----------------------------------------------------------------------    MAU Management/MDM: Orders Placed This Encounter  Procedures   CBC   Comprehensive metabolic panel   Protein / creatinine ratio, urine   Discharge patient    Meds ordered this encounter  Medications   acetaminophen-caffeine (EXCEDRIN TENSION HEADACHE) 500-65 MG per tablet 2 tablet   NIFEdipine (PROCARDIA-XL/NIFEDICAL-XL) 30 MG 24 hr tablet     Sig: Take 1 tablet (30 mg total) by mouth daily.    Dispense:  90 tablet    Refill:  1    PreE work-up performed. Excedrin given for HA. Upon re-evaluation, felt much improved. Labs returned wnl. Discussed new diagnosis of gHTN given elevated BP in office and in MAU 4+ hours apart. Discussed case with Dr. Sallye Ober. Procardia XL 30 mg with follow-up in office next week for BP check and delivery planning.  ASSESSMENT 1. Gestational hypertension, third trimester   2. [redacted] weeks gestation of pregnancy     PLAN Discharge home with strict return precautions. Allergies as of 04/19/2023       Reactions   Shellfish Allergy Hives        Medication List     TAKE these medications    Adult Blood Pressure Cuff Lg Kit To monitor BP at home   aspirin EC 81 MG tablet Take 81 mg by mouth daily. Swallow whole.   NIFEdipine 30 MG 24 hr tablet Commonly known as: PROCARDIA-XL/NIFEDICAL-XL Take 1 tablet (30 mg total) by mouth daily.   prenatal multivitamin Tabs tablet Take 1 tablet by mouth daily at 12 noon.         Wylene Simmer, MD OB Fellow 04/19/2023  7:35 PM

## 2023-04-22 ENCOUNTER — Inpatient Hospital Stay (HOSPITAL_COMMUNITY)
Admission: RE | Admit: 2023-04-22 | Payer: BC Managed Care – PPO | Source: Home / Self Care | Admitting: Obstetrics and Gynecology

## 2023-04-26 ENCOUNTER — Ambulatory Visit: Payer: BC Managed Care – PPO | Attending: Obstetrics and Gynecology

## 2023-04-26 DIAGNOSIS — Z8759 Personal history of other complications of pregnancy, childbirth and the puerperium: Secondary | ICD-10-CM | POA: Diagnosis present

## 2023-04-26 DIAGNOSIS — O99213 Obesity complicating pregnancy, third trimester: Secondary | ICD-10-CM | POA: Insufficient documentation

## 2023-04-26 DIAGNOSIS — O09293 Supervision of pregnancy with other poor reproductive or obstetric history, third trimester: Secondary | ICD-10-CM

## 2023-04-26 DIAGNOSIS — O0943 Supervision of pregnancy with grand multiparity, third trimester: Secondary | ICD-10-CM | POA: Diagnosis not present

## 2023-04-26 DIAGNOSIS — O133 Gestational [pregnancy-induced] hypertension without significant proteinuria, third trimester: Secondary | ICD-10-CM

## 2023-04-26 DIAGNOSIS — Z3A35 35 weeks gestation of pregnancy: Secondary | ICD-10-CM

## 2023-04-26 DIAGNOSIS — E669 Obesity, unspecified: Secondary | ICD-10-CM

## 2023-04-29 ENCOUNTER — Telehealth (HOSPITAL_COMMUNITY): Payer: Self-pay | Admitting: *Deleted

## 2023-04-29 NOTE — Telephone Encounter (Signed)
Preadmission screen  

## 2023-04-30 ENCOUNTER — Encounter (HOSPITAL_COMMUNITY): Payer: Self-pay | Admitting: *Deleted

## 2023-05-03 ENCOUNTER — Ambulatory Visit: Payer: BC Managed Care – PPO | Attending: Obstetrics and Gynecology

## 2023-05-06 ENCOUNTER — Inpatient Hospital Stay (HOSPITAL_COMMUNITY): Payer: BC Managed Care – PPO

## 2023-05-06 ENCOUNTER — Other Ambulatory Visit: Payer: Self-pay

## 2023-05-06 ENCOUNTER — Inpatient Hospital Stay (HOSPITAL_COMMUNITY): Payer: BC Managed Care – PPO | Admitting: Anesthesiology

## 2023-05-06 ENCOUNTER — Encounter (HOSPITAL_COMMUNITY): Payer: Self-pay | Admitting: Obstetrics and Gynecology

## 2023-05-06 ENCOUNTER — Inpatient Hospital Stay (HOSPITAL_COMMUNITY)
Admission: RE | Admit: 2023-05-06 | Discharge: 2023-05-07 | DRG: 807 | Disposition: A | Discharge status: Home / Self Care | Payer: BC Managed Care – PPO | Attending: Obstetrics and Gynecology | Admitting: Obstetrics and Gynecology

## 2023-05-06 DIAGNOSIS — Z825 Family history of asthma and other chronic lower respiratory diseases: Secondary | ICD-10-CM | POA: Diagnosis not present

## 2023-05-06 DIAGNOSIS — Z3A37 37 weeks gestation of pregnancy: Secondary | ICD-10-CM

## 2023-05-06 DIAGNOSIS — Z3490 Encounter for supervision of normal pregnancy, unspecified, unspecified trimester: Principal | ICD-10-CM

## 2023-05-06 DIAGNOSIS — Z8249 Family history of ischemic heart disease and other diseases of the circulatory system: Secondary | ICD-10-CM | POA: Diagnosis not present

## 2023-05-06 DIAGNOSIS — O134 Gestational [pregnancy-induced] hypertension without significant proteinuria, complicating childbirth: Secondary | ICD-10-CM | POA: Diagnosis present

## 2023-05-06 DIAGNOSIS — O99214 Obesity complicating childbirth: Secondary | ICD-10-CM | POA: Diagnosis present

## 2023-05-06 DIAGNOSIS — O99824 Streptococcus B carrier state complicating childbirth: Secondary | ICD-10-CM | POA: Diagnosis present

## 2023-05-06 DIAGNOSIS — O139 Gestational [pregnancy-induced] hypertension without significant proteinuria, unspecified trimester: Secondary | ICD-10-CM | POA: Diagnosis present

## 2023-05-06 LAB — HIV ANTIBODY (ROUTINE TESTING W REFLEX): HIV Screen 4th Generation wRfx: NONREACTIVE

## 2023-05-06 LAB — COMPREHENSIVE METABOLIC PANEL
ALT: 29 U/L (ref 0–44)
AST: 33 U/L (ref 15–41)
Albumin: 2.8 g/dL — ABNORMAL LOW (ref 3.5–5.0)
Alkaline Phosphatase: 189 U/L — ABNORMAL HIGH (ref 38–126)
Anion gap: 16 — ABNORMAL HIGH (ref 5–15)
BUN: 9 mg/dL (ref 6–20)
CO2: 20 mmol/L — ABNORMAL LOW (ref 22–32)
Calcium: 9 mg/dL (ref 8.9–10.3)
Chloride: 99 mmol/L (ref 98–111)
Creatinine, Ser: 0.57 mg/dL (ref 0.44–1.00)
GFR, Estimated: >60 mL/min (ref >60)
Glucose, Bld: 100 mg/dL — ABNORMAL HIGH (ref 70–99)
Potassium: 3.5 mmol/L (ref 3.5–5.1)
Sodium: 135 mmol/L (ref 135–145)
Total Bilirubin: 0.6 mg/dL (ref 0.3–1.2)
Total Protein: 6.3 g/dL — ABNORMAL LOW (ref 6.5–8.1)

## 2023-05-06 LAB — CBC
HCT: 32.9 % — ABNORMAL LOW (ref 36.0–46.0)
Hemoglobin: 10.2 g/dL — ABNORMAL LOW (ref 12.0–15.0)
MCH: 24.7 pg — ABNORMAL LOW (ref 26.0–34.0)
MCHC: 31 g/dL (ref 30.0–36.0)
MCV: 79.7 fL — ABNORMAL LOW (ref 80.0–100.0)
Platelets: 282 10*3/uL (ref 150–400)
RBC: 4.13 MIL/uL (ref 3.87–5.11)
RDW: 16.1 % — ABNORMAL HIGH (ref 11.5–15.5)
WBC: 8.5 10*3/uL (ref 4.0–10.5)
nRBC: 0 % (ref 0.0–0.2)

## 2023-05-06 LAB — PROTEIN / CREATININE RATIO, URINE
Creatinine, Urine: 171 mg/dL
Protein Creatinine Ratio: 0.12 mg/mg{creat} (ref 0.00–0.15)
Total Protein, Urine: 20 mg/dL

## 2023-05-06 LAB — URIC ACID: Uric Acid, Serum: 5.7 mg/dL (ref 2.5–7.1)

## 2023-05-06 LAB — TYPE AND SCREEN
ABO/RH(D): O POS
Antibody Screen: NEGATIVE

## 2023-05-06 LAB — LACTATE DEHYDROGENASE: LDH: 137 U/L (ref 98–192)

## 2023-05-06 LAB — RPR: RPR Ser Ql: NONREACTIVE

## 2023-05-06 MED ORDER — PENICILLIN G POT IN DEXTROSE 60000 UNIT/ML IV SOLN
3.0000 10*6.[IU] | INTRAVENOUS | Status: DC
  Administered 2023-05-06: 3 10*6.[IU] via INTRAVENOUS
  Filled 2023-05-06: qty 50

## 2023-05-06 MED ORDER — DIBUCAINE (PERIANAL) 1 % EX OINT: 1.0000 | TOPICAL_OINTMENT | CUTANEOUS | Status: DC | PRN

## 2023-05-06 MED ORDER — OXYCODONE-ACETAMINOPHEN 5-325 MG PO TABS: 2.0000 | ORAL_TABLET | ORAL | Status: DC | PRN

## 2023-05-06 MED ORDER — LIDOCAINE HCL (PF) 1 % IJ SOLN: 30.0000 mL | INTRAMUSCULAR | Status: DC | PRN

## 2023-05-06 MED ORDER — OXYCODONE HCL 5 MG PO TABS: 10.0000 mg | ORAL_TABLET | ORAL | Status: DC | PRN

## 2023-05-06 MED ORDER — WITCH HAZEL-GLYCERIN EX PADS: 1.0000 | MEDICATED_PAD | CUTANEOUS | Status: DC | PRN

## 2023-05-06 MED ORDER — OXYCODONE HCL 5 MG PO TABS: 5.0000 mg | ORAL_TABLET | ORAL | Status: DC | PRN

## 2023-05-06 MED ORDER — IBUPROFEN 600 MG PO TABS
600.0000 mg | ORAL_TABLET | Freq: Four times a day (QID) | ORAL | Status: DC
  Administered 2023-05-06 – 2023-05-07 (×5): 600 mg via ORAL
  Filled 2023-05-06 (×5): qty 1

## 2023-05-06 MED ORDER — EPHEDRINE 5 MG/ML INJ: 10.0000 mg | INTRAVENOUS | Status: DC | PRN

## 2023-05-06 MED ORDER — LIDOCAINE HCL (PF) 1 % IJ SOLN
INTRAMUSCULAR | Status: DC | PRN
  Administered 2023-05-06 (×2): 4 mL via EPIDURAL

## 2023-05-06 MED ORDER — SOD CITRATE-CITRIC ACID 500-334 MG/5ML PO SOLN: 30.0000 mL | ORAL | Status: DC | PRN

## 2023-05-06 MED ORDER — PRENATAL MULTIVITAMIN CH
1.0000 | ORAL_TABLET | Freq: Every day | ORAL | Status: DC
  Administered 2023-05-06 – 2023-05-07 (×2): 1 via ORAL
  Filled 2023-05-06 (×2): qty 1

## 2023-05-06 MED ORDER — ZOLPIDEM TARTRATE 5 MG PO TABS: 5.0000 mg | ORAL_TABLET | Freq: Every evening | ORAL | Status: DC | PRN

## 2023-05-06 MED ORDER — PHENYLEPHRINE 80 MCG/ML (10ML) SYRINGE FOR IV PUSH (FOR BLOOD PRESSURE SUPPORT): 80.0000 ug | PREFILLED_SYRINGE | INTRAVENOUS | Status: DC | PRN

## 2023-05-06 MED ORDER — LACTATED RINGERS IV SOLN: INTRAVENOUS | Status: DC

## 2023-05-06 MED ORDER — ONDANSETRON HCL 4 MG/2ML IJ SOLN: 4.0000 mg | Freq: Four times a day (QID) | INTRAMUSCULAR | Status: DC | PRN

## 2023-05-06 MED ORDER — ACETAMINOPHEN 325 MG PO TABS
650.0000 mg | ORAL_TABLET | ORAL | Status: DC | PRN
  Administered 2023-05-06 – 2023-05-07 (×3): 650 mg via ORAL
  Filled 2023-05-06 (×3): qty 2

## 2023-05-06 MED ORDER — FENTANYL CITRATE (PF) 100 MCG/2ML IJ SOLN
50.0000 ug | INTRAMUSCULAR | Status: DC | PRN
  Administered 2023-05-06: 100 ug via INTRAVENOUS
  Filled 2023-05-06: qty 2

## 2023-05-06 MED ORDER — SIMETHICONE 80 MG PO CHEW: 80.0000 mg | CHEWABLE_TABLET | ORAL | Status: DC | PRN

## 2023-05-06 MED ORDER — DIPHENHYDRAMINE HCL 25 MG PO CAPS: 25.0000 mg | ORAL_CAPSULE | Freq: Four times a day (QID) | ORAL | Status: DC | PRN

## 2023-05-06 MED ORDER — LACTATED RINGERS IV SOLN: 500.0000 mL | INTRAVENOUS | Status: DC | PRN

## 2023-05-06 MED ORDER — TERBUTALINE SULFATE 1 MG/ML IJ SOLN: 0.2500 mg | Freq: Once | INTRAMUSCULAR | Status: DC | PRN

## 2023-05-06 MED ORDER — SODIUM CHLORIDE 0.9% FLUSH: 3.0000 mL | INTRAVENOUS | Status: DC | PRN

## 2023-05-06 MED ORDER — DIPHENHYDRAMINE HCL 50 MG/ML IJ SOLN: 12.5000 mg | INTRAMUSCULAR | Status: DC | PRN

## 2023-05-06 MED ORDER — NIFEDIPINE ER OSMOTIC RELEASE 30 MG PO TB24
30.0000 mg | ORAL_TABLET | Freq: Every day | ORAL | Status: DC
  Administered 2023-05-06 – 2023-05-07 (×2): 30 mg via ORAL
  Filled 2023-05-06 (×2): qty 1

## 2023-05-06 MED ORDER — ONDANSETRON HCL 4 MG PO TABS: 4.0000 mg | ORAL_TABLET | ORAL | Status: DC | PRN

## 2023-05-06 MED ORDER — ACETAMINOPHEN 325 MG PO TABS
650.0000 mg | ORAL_TABLET | ORAL | Status: DC | PRN
  Administered 2023-05-06: 650 mg via ORAL
  Filled 2023-05-06: qty 2

## 2023-05-06 MED ORDER — OXYTOCIN-SODIUM CHLORIDE 30-0.9 UT/500ML-% IV SOLN: 2.5000 [IU]/h | INTRAVENOUS | Status: DC

## 2023-05-06 MED ORDER — SODIUM CHLORIDE 0.9 % IV SOLN: 250.0000 mL | INTRAVENOUS | Status: DC | PRN

## 2023-05-06 MED ORDER — MAGNESIUM HYDROXIDE 400 MG/5ML PO SUSP
30.0000 mL | ORAL | Status: DC | PRN
  Administered 2023-05-07: 30 mL via ORAL
  Filled 2023-05-06 (×2): qty 30

## 2023-05-06 MED ORDER — OXYTOCIN-SODIUM CHLORIDE 30-0.9 UT/500ML-% IV SOLN: 2.5000 [IU]/h | INTRAVENOUS | Status: DC | PRN

## 2023-05-06 MED ORDER — OXYTOCIN BOLUS FROM INFUSION
333.0000 mL | Freq: Once | INTRAVENOUS | Status: AC
  Administered 2023-05-06: 333 mL via INTRAVENOUS

## 2023-05-06 MED ORDER — SODIUM CHLORIDE 0.9% FLUSH
3.0000 mL | Freq: Two times a day (BID) | INTRAVENOUS | Status: DC
  Administered 2023-05-06 – 2023-05-07 (×3): 3 mL via INTRAVENOUS

## 2023-05-06 MED ORDER — LACTATED RINGERS IV SOLN
500.0000 mL | Freq: Once | INTRAVENOUS | Status: AC
  Administered 2023-05-06: 500 mL via INTRAVENOUS

## 2023-05-06 MED ORDER — OXYTOCIN-SODIUM CHLORIDE 30-0.9 UT/500ML-% IV SOLN
1.0000 m[IU]/min | INTRAVENOUS | Status: DC
  Administered 2023-05-06: 2 m[IU]/min via INTRAVENOUS
  Filled 2023-05-06: qty 500

## 2023-05-06 MED ORDER — ONDANSETRON HCL 4 MG/2ML IJ SOLN: 4.0000 mg | INTRAMUSCULAR | Status: DC | PRN

## 2023-05-06 MED ORDER — BENZOCAINE-MENTHOL 20-0.5 % EX AERO
1.0000 | INHALATION_SPRAY | CUTANEOUS | Status: DC | PRN
  Administered 2023-05-06: 1 via TOPICAL
  Filled 2023-05-06: qty 56

## 2023-05-06 MED ORDER — FENTANYL-BUPIVACAINE-NACL 0.5-0.125-0.9 MG/250ML-% EP SOLN
12.0000 mL/h | EPIDURAL | Status: DC | PRN
  Administered 2023-05-06: 12 mL/h via EPIDURAL
  Filled 2023-05-06: qty 250

## 2023-05-06 MED ORDER — SODIUM CHLORIDE 0.9 % IV SOLN
5.0000 10*6.[IU] | Freq: Once | INTRAVENOUS | Status: AC
  Administered 2023-05-06: 5 10*6.[IU] via INTRAVENOUS
  Filled 2023-05-06: qty 5

## 2023-05-06 MED ORDER — COCONUT OIL OIL: 1.0000 | TOPICAL_OIL | Status: DC | PRN

## 2023-05-06 MED ORDER — OXYCODONE-ACETAMINOPHEN 5-325 MG PO TABS: 1.0000 | ORAL_TABLET | ORAL | Status: DC | PRN

## 2023-05-06 NOTE — Anesthesia Procedure Notes (Signed)
Epidural Patient location during procedure: OB Start time: 05/06/2023 6:35 AM End time: 05/06/2023 6:40 AM  Staffing Anesthesiologist: Linton Rump, MD Performed: anesthesiologist   Preanesthetic Checklist Completed: patient identified, IV checked, site marked, risks and benefits discussed, surgical consent, monitors and equipment checked, pre-op evaluation and timeout performed  Epidural Patient position: sitting Prep: DuraPrep and site prepped and draped Patient monitoring: continuous pulse ox and blood pressure Approach: midline Location: L3-L4 Injection technique: LOR saline  Needle:  Needle type: Tuohy  Needle gauge: 17 G Needle length: 9 cm and 9 Needle insertion depth: 6 cm Catheter type: closed end flexible Catheter size: 19 Gauge Catheter at skin depth: 11 cm Test dose: negative  Assessment Events: blood not aspirated, no cerebrospinal fluid, injection not painful, no injection resistance, no paresthesia and negative IV test  Additional Notes The patient has requested an epidural for labor pain management. Risks and benefits including, but not limited to, infection, bleeding, local anesthetic toxicity, headache, hypotension, back pain, block failure, etc. were discussed with the patient. The patient expressed understanding and consented to the procedure. I confirmed that the patient has no bleeding disorders and is not taking blood thinners. I confirmed the patient's last platelet count with the nurse. A time-out was performed immediately prior to the procedure. Please see nursing documentation for vital signs. Sterile technique was used throughout the whole procedure. Once LOR achieved, the epidural catheter threaded easily without resistance. Aspiration of the catheter was negative for blood and CSF. The epidural was dosed slowly and an infusion was started.  1 attempt(s)Reason for block:procedure for pain

## 2023-05-06 NOTE — Anesthesia Postprocedure Evaluation (Signed)
Anesthesia Post Note  Patient: Kristin Lyons  Procedure(s) Performed: AN AD HOC LABOR EPIDURAL     Patient location during evaluation: Mother Baby Anesthesia Type: Epidural Level of consciousness: awake Pain management: satisfactory to patient Vital Signs Assessment: post-procedure vital signs reviewed and stable Respiratory status: spontaneous breathing Cardiovascular status: stable Anesthetic complications: no  No notable events documented.  Last Vitals:  Vitals:   05/06/23 0950 05/06/23 1103  BP: 139/68 (!) 141/67  Pulse: 85 88  Resp: 18 17  Temp: 36.8 C 36.9 C  SpO2: 99% 98%    Last Pain:  Vitals:   05/06/23 1401  TempSrc:   PainSc: 0-No pain   Pain Goal:                   [employer]
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
## 2023-05-06 NOTE — Lactation Note (Signed)
This note was copied from a baby's chart. Lactation Consultation Note  Patient Name: Kristin Lyons [id number] Date: 05/06/2023 Age:32 hours Reason for consult: Initial assessment;Early term 37-38.6wks P4, MOB latched infant on her right breast using the football hold with pillow support, infant was on and off the breast for 11 minutes, afterwards MOB supplemented infant with 20 kcal formula 10 mls. Birth Parent will continue to BF infant by cues, on demand every 2-3 hours, skin to skin. LC discussed importance of maternal rest, diet and hydration, LC discussed infant's input and output, infant had one void diaper since birth. MOB was made aware of O/P services, breastfeeding support groups, community resources, and our phone # for post-discharge questions.    Today's feeding plan: 1-MOB latch infant 1st fore every feeding to help stimulate and establish her milk supply.  2- Afterwards she will supplement infant with formula this is her feeding choice.  3- MOB will call RN/LC for latch assistance if needed.  Maternal Data Has patient been taught Hand Expression?: Yes Does the patient have breastfeeding experience prior to this delivery?: Yes How long did the patient breastfeed?: Per MOB, she BF her 3rd child for 4 months, stopped due to returning to work.  Feeding Mother's Current Feeding Choice: Breast Milk and Formula Nipple Type: Slow - flow  LATCH Score Latch: Repeated attempts needed to sustain latch, nipple held in mouth throughout feeding, stimulation needed to elicit sucking reflex.  Audible Swallowing: A few with stimulation  Type of Nipple: Everted at rest and after stimulation  Comfort (Breast/Nipple): Soft / non-tender  Hold (Positioning): Assistance needed to correctly position infant at breast and maintain latch.  LATCH Score: 7   Lactation Tools Discussed/Used    Interventions Interventions: Breast feeding basics reviewed;Assisted with latch;Skin to  skin;Breast compression;Adjust position;Support pillows;Position options;DEBP;Education;LC Services brochure  Discharge Pump: DEBP;Personal;Hands Free  Consult Status Consult Status: Follow-up Date: 05/07/23 Follow-up type: In-patient    Frederico Hamman 05/06/2023, 4:35 PM

## 2023-05-06 NOTE — Anesthesia Preprocedure Evaluation (Signed)
Anesthesia Evaluation  Patient identified by MRN, date of birth, ID band Patient awake    Reviewed: Allergy & Precautions, NPO status , Patient's Chart, lab work & pertinent test results  History of Anesthesia Complications Negative for: history of anesthetic complications  Airway Mallampati: III  TM Distance: >3 FB Neck ROM: Full    Dental   Pulmonary asthma    Pulmonary exam normal breath sounds clear to auscultation       Cardiovascular hypertension (PIH), Pt. on medications  Rhythm:Regular Rate:Normal     Neuro/Psych  PSYCHIATRIC DISORDERS Anxiety Depression    negative neurological ROS     GI/Hepatic negative GI ROS, Neg liver ROS,,,  Endo/Other  neg diabetes  Morbid obesity  Renal/GU negative Renal ROS     Musculoskeletal   Abdominal  (+) + obese  Peds  Hematology negative hematology ROS (+) Lab Results      Component                Value               Date                      WBC                      8.5                 05/06/2023                HGB                      10.2 (L)            05/06/2023                HCT                      32.9 (L)            05/06/2023                MCV                      79.7 (L)            05/06/2023                PLT                      282                 05/06/2023              Anesthesia Other Findings Takes baby aspirin. H/o IUFD.  Reproductive/Obstetrics (+) Pregnancy                             Anesthesia Physical Anesthesia Plan  ASA: 3  Anesthesia Plan: Epidural   Post-op Pain Management:    Induction:   PONV Risk Score and Plan:   Airway Management Planned:   Additional Equipment:   Intra-op Plan:   Post-operative Plan:   Informed Consent: I have reviewed the patients History and Physical, chart, labs and discussed the procedure including the risks, benefits and alternatives for the proposed anesthesia with the  patient or authorized representative who has indicated his/her understanding and acceptance.  Plan Discussed with: Anesthesiologist  Anesthesia Plan Comments: (I have discussed risks of neuraxial anesthesia including but not limited to infection, bleeding, nerve injury, back pain, headache, seizures, and failure of block. Patient denies bleeding disorders and is not currently anticoagulated. Labs have been reviewed. Risks and benefits discussed. All patient's questions answered.  )       Anesthesia Quick Evaluation

## 2023-05-07 LAB — CBC
HCT: 32.1 % — ABNORMAL LOW (ref 36.0–46.0)
Hemoglobin: 10 g/dL — ABNORMAL LOW (ref 12.0–15.0)
MCH: 25.3 pg — ABNORMAL LOW (ref 26.0–34.0)
MCHC: 31.2 g/dL (ref 30.0–36.0)
MCV: 81.3 fL (ref 80.0–100.0)
Platelets: 270 10*3/uL (ref 150–400)
RBC: 3.95 MIL/uL (ref 3.87–5.11)
RDW: 16.3 % — ABNORMAL HIGH (ref 11.5–15.5)
WBC: 10.4 10*3/uL (ref 4.0–10.5)
nRBC: 0.2 % (ref 0.0–0.2)

## 2023-05-07 LAB — BIRTH TISSUE RECOVERY COLLECTION (PLACENTA DONATION)

## 2023-05-07 MED ORDER — ACETAMINOPHEN 325 MG PO TABS: 650.0000 mg | ORAL_TABLET | Freq: Four times a day (QID) | ORAL | 1 refills | Status: AC | PRN

## 2023-05-07 MED ORDER — IBUPROFEN 600 MG PO TABS: 600.0000 mg | ORAL_TABLET | Freq: Four times a day (QID) | ORAL | 1 refills | Status: DC | PRN

## 2023-05-07 NOTE — Social Work (Signed)
MOB was referred for history of depression/anxiety.  * Referral screened out by Clinical Social Worker because none of the following criteria appear to apply:  ~ History of anxiety/depression during this pregnancy, or of post-partum depression following prior delivery.  ~ Diagnosis of anxiety and/or depression within last 3 years OR * MOB's symptoms currently being treated with medication and/or therapy.  Per chart review MOB diagnosis dates back to 2019, No concerns noted during this pregnancy.  Please contact the Clinical Social Worker if needs arise, or by MOB request.   Wende Neighbors, LCSWA Clinical Social Worker [phone]

## 2023-05-07 NOTE — Lactation Note (Signed)
This note was copied from a baby's chart. Lactation Consultation Note  Patient Name: Girl Kaliya Shreiner [id number] Date: 05/07/2023 Age:32 hours Reason for consult: Follow-up assessment;Early term 68-38.6wks  P4, Mother is breastfeeding and supplementing with formula after.  She is post pumping with her personal hands free pump.  Feed on demand with cues.  Goal 8-12+ times per day after first 24 hrs.  Place baby STS if not cueing.  Reviewed engorgement care and monitoring voids/stools. Provided mother with manual pump fitting with 21 mm flanges. Suggest calling for help as needed.    Maternal Data Has patient been taught Hand Expression?: Yes  Feeding Mother's Current Feeding Choice: Breast Milk and Formula  LATCH Score Latch: Repeated attempts needed to sustain latch, nipple held in mouth throughout feeding, stimulation needed to elicit sucking reflex.  Audible Swallowing: A few with stimulation  Type of Nipple: Everted at rest and after stimulation  Comfort (Breast/Nipple): Soft / non-tender  Hold (Positioning): No assistance needed to correctly position infant at breast.  LATCH Score: 8   Lactation Tools Discussed/Used Tools: Pump;Flanges Flange Size: 21 Breast pump type: Manual Pumping frequency: PRN, mother's request  Interventions Interventions: Education;Hand pump  Discharge Discharge Education: Engorgement and breast care;Warning signs for feeding baby  Consult Status Consult Status: PRN    Dahlia Byes [facility] 05/07/2023, 1:30 PM

## 2023-05-07 NOTE — Progress Notes (Signed)
Post Partum Day 1 Subjective: no complaints, up ad lib, voiding, tolerating PO, and + flatus  Objective: Blood pressure (!) 132/57, pulse 92, temperature 98.3 F (36.8 C), temperature source Oral, resp. rate 18, height 5\' 5"  (1.651 m), weight 116 kg, last menstrual period 08/20/2022, SpO2 99%, unknown if currently breastfeeding.  Physical Exam:  General: alert, cooperative, and no distress Lochia: appropriate Uterine Fundus: FF, NT Incision: n/a DVT Evaluation: no calf tenderness  Recent Labs    05/06/23 0048 05/07/23 0427  HGB 10.2* 10.0*  HCT 32.9* 32.1*    Assessment/Plan: Plan for discharge tomorrow GHTN on procardia xl 30mg  daily Cont routine PP care   LOS: 1 day   Purcell Nails, MD 05/07/2023, 2:45 PM

## 2023-05-07 NOTE — Discharge Summary (Signed)
Postpartum Discharge Summary  Date of Service updated 05-07-23     Patient Name: Kristin Lyons DOB: 03/20/1991 MRN: [id number]  Date of admission: 05/06/2023 Delivery date:05/06/2023 Delivering provider: Hoover Browns Date of discharge: 05/07/2023  Admitting diagnosis: Term pregnancy [Z34.90] Gestational hypertension [O13.9] Intrauterine pregnancy: [redacted]w[redacted]d     Secondary diagnosis:  Principal Problem:   Term pregnancy Active Problems:   Gestational hypertension  Additional problems: none    Discharge diagnosis: Term Pregnancy Delivered                                              Post partum procedures: routine pp care Augmentation: Pitocin Complications: None  Hospital course: Induction of Labor With Vaginal Delivery   32 y.o. yo [id number] at [redacted]w[redacted]d was admitted to the hospital 05/06/2023 for induction of labor.  Indication for induction: Gestational hypertension.  Patient had an labor course complicated by nothing Membrane Rupture Time/Date: 5:34 AM,05/06/2023  Delivery Method:Vaginal, Spontaneous Operative Delivery:N/A Episiotomy: None Lacerations:  None Details of delivery can be found in separate delivery note.  Patient had a postpartum course complicated by nothing. Patient is discharged home 05/07/23.  Newborn Data: Birth date:05/06/2023 Birth time:7:47 AM Gender:Female Living status:Living Apgars:9 ,9  Weight:2930 g  Immunizations administered: There is no immunization history for the selected administration types on file for this patient.  Physical exam  Vitals:   05/06/23 2333 05/07/23 0523 05/07/23 0911 05/07/23 1411  BP: (!) 128/58 (!) 140/61 (!) 128/58 (!) 132/57  Pulse: 87 92  92  Resp: 18 18  18   Temp: 98.2 F (36.8 C) 98.4 F (36.9 C)  98.3 F (36.8 C)  TempSrc: Oral Oral  Oral  SpO2: 99% 99%    Weight:      Height:       General: alert, cooperative, and no distress Lochia: appropriate Uterine Fundus: firm Incision: N/A DVT Evaluation: no  calf tenderness  Labs: Lab Results  Component Value Date   WBC 10.4 05/07/2023   HGB 10.0 (L) 05/07/2023   HCT 32.1 (L) 05/07/2023   MCV 81.3 05/07/2023   PLT 270 05/07/2023      Latest Ref Rng & Units 05/06/2023    1:27 AM  CMP  Glucose 70 - 99 mg/dL 147   BUN 6 - 20 mg/dL 9   Creatinine 8.29 - 5.62 mg/dL 1.30   Sodium 865 - 784 mmol/L 135   Potassium 3.5 - 5.1 mmol/L 3.5   Chloride 98 - 111 mmol/L 99   CO2 22 - 32 mmol/L 20   Calcium 8.9 - 10.3 mg/dL 9.0   Total Protein 6.5 - 8.1 g/dL 6.3   Total Bilirubin 0.3 - 1.2 mg/dL 0.6   Alkaline Phos 38 - 126 U/L 189   AST 15 - 41 U/L 33   ALT 0 - 44 U/L 29    Edinburgh Score:    05/06/2023    4:48 PM  Edinburgh Postnatal Depression Scale Screening Tool  I have been able to laugh and see the funny side of things. 0  I have looked forward with enjoyment to things. 0  I have blamed myself unnecessarily when things went wrong. 0  I have been anxious or worried for no good reason. 2  I have felt scared or panicky for no good reason. 1  Things have been getting on top of me.  0  I have been so unhappy that I have had difficulty sleeping. 0  I have felt sad or miserable. 0  I have been so unhappy that I have been crying. 0  The thought of harming myself has occurred to me. 0  Edinburgh Postnatal Depression Scale Total 3      After visit meds:  Allergies as of 05/07/2023       Reactions   Shellfish Allergy Hives        Medication List     STOP taking these medications    aspirin EC 81 MG tablet       TAKE these medications    acetaminophen 325 MG tablet Commonly known as: Tylenol Take 2 tablets (650 mg total) by mouth every 6 (six) hours as needed for mild pain or moderate pain (for pain scale < 4).   Adult Blood Pressure Cuff Lg Kit To monitor BP at home   ibuprofen 600 MG tablet Commonly known as: ADVIL Take 1 tablet (600 mg total) by mouth every 6 (six) hours as needed for mild pain, moderate pain or  cramping.   NIFEdipine 30 MG 24 hr tablet Commonly known as: PROCARDIA-XL/NIFEDICAL-XL Take 1 tablet (30 mg total) by mouth daily.   prenatal multivitamin Tabs tablet Take 1 tablet by mouth daily at 12 noon.         Discharge home in stable condition Infant Disposition:home with mother Discharge instruction: per After Visit Summary and Postpartum booklet. Activity: Advance as tolerated. Pelvic rest for 6 weeks.  Diet: routine diet Anticipated Birth Control: Unsure Postpartum Appointment:6 weeks Additional Postpartum F/U: BP check 1 week Future Appointments:No future appointments. Follow up Visit:  Follow-up Information     [facility]. Schedule an appointment as soon as possible for a visit in 1 week(s).   Specialty: Obstetrics and Gynecology Why: For BP check Contact information: [address] [place] [address] [phone]        [facility]. Schedule an appointment as soon as possible for a visit in 6 week(s).   Specialty: Obstetrics and Gynecology Why: For Postpartum follow-up Contact information: [address] [place] [address] [phone]                    05/07/2023 Purcell Nails, MD

## 2023-05-10 ENCOUNTER — Ambulatory Visit: Payer: BC Managed Care – PPO

## 2023-05-16 ENCOUNTER — Inpatient Hospital Stay (HOSPITAL_COMMUNITY): Payer: BC Managed Care – PPO

## 2023-05-16 ENCOUNTER — Inpatient Hospital Stay (HOSPITAL_COMMUNITY): Admit: 2023-05-16 | Payer: BC Managed Care – PPO | Admitting: Obstetrics and Gynecology

## 2023-05-17 ENCOUNTER — Ambulatory Visit: Payer: BC Managed Care – PPO

## 2023-05-20 ENCOUNTER — Encounter (HOSPITAL_COMMUNITY): Payer: BC Managed Care – PPO

## 2023-06-01 ENCOUNTER — Telehealth (HOSPITAL_COMMUNITY): Payer: Self-pay | Admitting: *Deleted

## 2023-06-01 NOTE — Telephone Encounter (Signed)
Attempted hospital discharge follow-up call. Left message for patient to return RN call with any questions or concerns. Deforest Hoyles, RN, 06/01/23, [phone]

## 2023-09-19 ENCOUNTER — Inpatient Hospital Stay (HOSPITAL_COMMUNITY): Payer: BC Managed Care – PPO | Admitting: Certified Registered Nurse Anesthetist

## 2023-09-19 ENCOUNTER — Encounter (HOSPITAL_COMMUNITY): Payer: Self-pay

## 2023-09-19 ENCOUNTER — Encounter (HOSPITAL_COMMUNITY)
Admission: AD | Disposition: A | Discharge status: Home / Self Care | Payer: Self-pay | Source: Home / Self Care | Attending: Obstetrics and Gynecology

## 2023-09-19 ENCOUNTER — Inpatient Hospital Stay (HOSPITAL_COMMUNITY)
Admission: AD | Admit: 2023-09-19 | Discharge: 2023-09-20 | Disposition: A | Discharge status: Home / Self Care | Payer: BC Managed Care – PPO | Attending: Obstetrics and Gynecology | Admitting: Obstetrics and Gynecology

## 2023-09-19 DIAGNOSIS — Z3A00 Weeks of gestation of pregnancy not specified: Secondary | ICD-10-CM

## 2023-09-19 DIAGNOSIS — O00101 Right tubal pregnancy without intrauterine pregnancy: Secondary | ICD-10-CM | POA: Diagnosis present

## 2023-09-19 DIAGNOSIS — O008 Other ectopic pregnancy without intrauterine pregnancy: Secondary | ICD-10-CM | POA: Diagnosis not present

## 2023-09-19 HISTORY — PX: DIAGNOSTIC LAPAROSCOPY WITH REMOVAL OF ECTOPIC PREGNANCY: SHX6449

## 2023-09-19 LAB — CBC
HCT: 38.5 % (ref 36.0–46.0)
Hemoglobin: 12.4 g/dL (ref 12.0–15.0)
MCH: 27 pg (ref 26.0–34.0)
MCHC: 32.2 g/dL (ref 30.0–36.0)
MCV: 83.9 fL (ref 80.0–100.0)
Platelets: 346 10*3/uL (ref 150–400)
RBC: 4.59 MIL/uL (ref 3.87–5.11)
RDW: 14.8 % (ref 11.5–15.5)
WBC: 7.9 10*3/uL (ref 4.0–10.5)
nRBC: 0 % (ref 0.0–0.2)

## 2023-09-19 LAB — HCG, QUANTITATIVE, PREGNANCY: hCG, Beta Chain, Quant, S: 5789 m[IU]/mL — ABNORMAL HIGH (ref <5)

## 2023-09-19 LAB — TYPE AND SCREEN
ABO/RH(D): O POS
Antibody Screen: NEGATIVE

## 2023-09-19 SURGERY — LAPAROSCOPY, WITH ECTOPIC PREGNANCY SURGICAL TREATMENT
Anesthesia: General

## 2023-09-19 MED ORDER — ACETAMINOPHEN 10 MG/ML IV SOLN
INTRAVENOUS | Status: AC
  Filled 2023-09-19: qty 100

## 2023-09-19 MED ORDER — ROCURONIUM BROMIDE 10 MG/ML (PF) SYRINGE
PREFILLED_SYRINGE | INTRAVENOUS | Status: DC | PRN
  Administered 2023-09-19: 50 mg via INTRAVENOUS
  Administered 2023-09-20: 20 mg via INTRAVENOUS

## 2023-09-19 MED ORDER — ACETAMINOPHEN 500 MG PO TABS: 1000.0000 mg | ORAL_TABLET | ORAL | Status: DC

## 2023-09-19 MED ORDER — PROPOFOL 10 MG/ML IV BOLUS
INTRAVENOUS | Status: AC
  Filled 2023-09-19: qty 20

## 2023-09-19 MED ORDER — LACTATED RINGERS IV SOLN: INTRAVENOUS | Status: DC

## 2023-09-19 MED ORDER — FENTANYL CITRATE (PF) 250 MCG/5ML IJ SOLN
INTRAMUSCULAR | Status: AC
  Filled 2023-09-19: qty 5

## 2023-09-19 MED ORDER — MIDAZOLAM HCL 2 MG/2ML IJ SOLN
INTRAMUSCULAR | Status: DC | PRN
  Administered 2023-09-19: 2 mg via INTRAVENOUS

## 2023-09-19 MED ORDER — MIDAZOLAM HCL 2 MG/2ML IJ SOLN
INTRAMUSCULAR | Status: AC
  Filled 2023-09-19: qty 2

## 2023-09-19 MED ORDER — SODIUM CHLORIDE 0.9 % IV SOLN
2.0000 g | INTRAVENOUS | Status: AC
  Administered 2023-09-19: 2 g via INTRAVENOUS
  Filled 2023-09-19 (×2): qty 2

## 2023-09-19 MED ORDER — FENTANYL CITRATE (PF) 250 MCG/5ML IJ SOLN
INTRAMUSCULAR | Status: DC | PRN
  Administered 2023-09-19: 100 ug via INTRAVENOUS
  Administered 2023-09-20 (×3): 50 ug via INTRAVENOUS

## 2023-09-19 MED ORDER — HYDROMORPHONE HCL 1 MG/ML IJ SOLN: 0.2500 mg | INTRAMUSCULAR | Status: DC | PRN

## 2023-09-19 MED ORDER — PHENYLEPHRINE 80 MCG/ML (10ML) SYRINGE FOR IV PUSH (FOR BLOOD PRESSURE SUPPORT)
PREFILLED_SYRINGE | INTRAVENOUS | Status: DC | PRN
  Administered 2023-09-19 – 2023-09-20 (×5): 80 ug via INTRAVENOUS

## 2023-09-19 MED ORDER — POVIDONE-IODINE 10 % EX SWAB: 2.0000 | Freq: Once | CUTANEOUS | Status: DC

## 2023-09-19 MED ORDER — LACTATED RINGERS IV SOLN: INTRAVENOUS | Status: DC | PRN

## 2023-09-19 MED ORDER — LIDOCAINE 2% (20 MG/ML) 5 ML SYRINGE
INTRAMUSCULAR | Status: DC | PRN
  Administered 2023-09-19: 60 mg via INTRAVENOUS

## 2023-09-19 MED ORDER — DEXAMETHASONE SODIUM PHOSPHATE 10 MG/ML IJ SOLN
INTRAMUSCULAR | Status: DC | PRN
  Administered 2023-09-19: 10 mg via INTRAVENOUS

## 2023-09-19 MED ORDER — BUPIVACAINE HCL (PF) 0.25 % IJ SOLN
INTRAMUSCULAR | Status: AC
  Filled 2023-09-19: qty 30

## 2023-09-19 MED ORDER — PROPOFOL 10 MG/ML IV BOLUS
INTRAVENOUS | Status: DC | PRN
  Administered 2023-09-19: 200 mg via INTRAVENOUS

## 2023-09-19 MED ORDER — SUCCINYLCHOLINE CHLORIDE 200 MG/10ML IV SOSY
PREFILLED_SYRINGE | INTRAVENOUS | Status: DC | PRN
  Administered 2023-09-19: 100 mg via INTRAVENOUS

## 2023-09-19 SURGICAL SUPPLY — 33 items
APPLICATOR ARISTA FLEXITIP XL (MISCELLANEOUS) IMPLANT
CABLE HIGH FREQUENCY MONO STRZ (ELECTRODE) IMPLANT
DRSG OPSITE POSTOP 3X4 (GAUZE/BANDAGES/DRESSINGS) IMPLANT
DRSG OPSITE POSTOP 4X6 (GAUZE/BANDAGES/DRESSINGS) IMPLANT
GLOVE BIOGEL PI IND STRL 6.5 (GLOVE) ×2 IMPLANT
GLOVE BIOGEL PI IND STRL 7.0 (GLOVE) ×4 IMPLANT
GLOVE SURG ENC TEXT LTX SZ6.5 (GLOVE) ×4 IMPLANT
GOWN STRL REUS W/ TWL LRG LVL3 (GOWN DISPOSABLE) ×4 IMPLANT
HEMOSTAT ARISTA ABSORB 3G PWDR (HEMOSTASIS) IMPLANT
IRRIG SUCT STRYKERFLOW 2 WTIP (MISCELLANEOUS) ×1 IMPLANT
IRRIGATION SUCT STRKRFLW 2 WTP (MISCELLANEOUS) IMPLANT
KIT PINK PAD W/HEAD ARE REST (MISCELLANEOUS) ×1 IMPLANT
KIT PINK PAD W/HEAD ARM REST (MISCELLANEOUS) ×2 IMPLANT
KIT TURNOVER KIT B (KITS) ×2 IMPLANT
LIGASURE VESSEL 5MM BLUNT TIP (ELECTROSURGICAL) IMPLANT
NS IRRIG 1000ML POUR BTL (IV SOLUTION) ×2 IMPLANT
PACK LAPAROSCOPY BASIN (CUSTOM PROCEDURE TRAY) ×2 IMPLANT
POUCH LAPAROSCOPIC INSTRUMENT (MISCELLANEOUS) ×2 IMPLANT
PROTECTOR NERVE ULNAR (MISCELLANEOUS) ×4 IMPLANT
SET TUBE SMOKE EVAC HIGH FLOW (TUBING) ×2 IMPLANT
SHEARS HARMONIC ACE PLUS 36CM (ENDOMECHANICALS) IMPLANT
SLEEVE ADV FIXATION 5X100MM (TROCAR) ×2 IMPLANT
SOL ELECTROSURG ANTI STICK (MISCELLANEOUS) IMPLANT
SOLUTION ELECTROSURG ANTI STCK (MISCELLANEOUS) IMPLANT
SUT VICRYL 0 UR6 27IN ABS (SUTURE) IMPLANT
SUT VICRYL 4-0 PS2 18IN ABS (SUTURE) ×2 IMPLANT
SYS BAG RETRIEVAL 10MM (BASKET) ×1 IMPLANT
SYSTEM BAG RETRIEVAL 10MM (BASKET) IMPLANT
TOWEL GREEN STERILE FF (TOWEL DISPOSABLE) ×4 IMPLANT
TRAY FOLEY W/BAG SLVR 14FR (SET/KITS/TRAYS/PACK) ×2 IMPLANT
TROCAR ADV FIXATION 11X100MM (TROCAR) IMPLANT
TROCAR ADV FIXATION 5X100MM (TROCAR) ×2 IMPLANT
WARMER LAPAROSCOPE (MISCELLANEOUS) ×2 IMPLANT

## 2023-09-19 NOTE — MAU Provider Note (Signed)
 R/o ectopic     S [name] is a 33 y.o. [phone] pregnant female at Unknown who presents to MAU today as sent from clinic for r/o ectopic. Spoke with covering physician Dr. Richardson Lyons and asked for w/u of ectopic.    Called Dr. Sallye Lyons and got sign out: LMP 1/8 Prior hx of L sided ectopic now s/p L salpingectomy  Hx of STIs Positive Upreg in clinic Pt states bleeding started 09/13/23 Pt states initially relief with ibuprofen however no longer  No solid PO intake today, 30cc of gingerale b/t 1730 -2130 TTP RLQ in clinic, no rebound or guarding  [place] with R adnexal mass measuring 5x4x3cm, thin uterine strip, nrl b/l ovaries, moderate free fluid in pelvis   Receives care at [facility]. Prenatal records reviewed.  Pertinent items noted in HPI and remainder of comprehensive ROS otherwise negative.   O There were no vitals taken for this visit. Physical Exam Vitals and nursing note reviewed.  Constitutional:      General: She is not in acute distress.    Appearance: She is well-developed. She is not ill-appearing.  HENT:     Head: Normocephalic and atraumatic.     Mouth/Throat:     Mouth: Mucous membranes are moist.     Pharynx: Oropharynx is clear.  Eyes:     Extraocular Movements: Extraocular movements intact.  Cardiovascular:     Rate and Rhythm: Normal rate.  Pulmonary:     Effort: Pulmonary effort is normal. No respiratory distress.  Abdominal:     General: Abdomen is flat. There is no distension.     Palpations: Abdomen is soft.     Tenderness: There is abdominal tenderness in the right lower quadrant. There is rebound. There is no guarding.  Skin:    General: Skin is warm and dry.  Neurological:     Mental Status: She is alert and oriented to person, place, and time.     Motor: No weakness.  Psychiatric:        Mood and Affect: Mood normal.        Behavior: Behavior normal.      MDM: low  MAU Course:  Called Dr. Richardson Lyons (covering attending) and gave sign out  regarding concern for rebound tenderness in RLQ and verbal sign out of US findings per primary OP provider Dr. Sallye Lyons.  She agrees with assuming care for plans to go to OR.   AP #R sided ectopic pregnancy - admit to Dr. Dawayne Lyons service with plans for eventual surgery  Kristin Dibble, MD 09/19/2023 9:06 PM

## 2023-09-19 NOTE — Anesthesia Preprocedure Evaluation (Addendum)
 Anesthesia Evaluation  Patient identified by MRN, date of birth, ID band Patient awake    Reviewed: Allergy & Precautions, H&P , NPO status , Patient's Chart, lab work & pertinent test results  Airway Mallampati: III  TM Distance: >3 FB Neck ROM: Full    Dental no notable dental hx. (+) Teeth Intact, Dental Advisory Given   Pulmonary asthma    Pulmonary exam normal breath sounds clear to auscultation       Cardiovascular  Rhythm:Regular Rate:Normal     Neuro/Psych   Anxiety     negative neurological ROS     GI/Hepatic negative GI ROS, Neg liver ROS,,,  Endo/Other    Class 3 obesity  Renal/GU negative Renal ROS  negative genitourinary   Musculoskeletal   Abdominal   Peds  Hematology negative hematology ROS (+)   Anesthesia Other Findings   Reproductive/Obstetrics negative OB ROS                             Anesthesia Physical Anesthesia Plan  ASA: 2 and emergent  Anesthesia Plan: General   Post-op Pain Management: Ofirmev IV (intra-op)*   Induction: Intravenous, Rapid sequence and Cricoid pressure planned  PONV Risk Score and Plan: 4 or greater and Ondansetron, Dexamethasone and Midazolam  Airway Management Planned: Oral ETT  Additional Equipment:   Intra-op Plan:   Post-operative Plan: Extubation in OR  Informed Consent: I have reviewed the patients History and Physical, chart, labs and discussed the procedure including the risks, benefits and alternatives for the proposed anesthesia with the patient or authorized representative who has indicated his/her understanding and acceptance.     Dental advisory given  Plan Discussed with: CRNA  Anesthesia Plan Comments:        Anesthesia Quick Evaluation

## 2023-09-19 NOTE — MAU Note (Signed)
..  Kristin Lyons is a 33 y.o. at Unknown here in MAU reporting: pain for a month on right lower abdomen that feels sharp, stabbing, aching pain. Put it off for so long d/t childcare. Reports vaginal bleeding, has saturated her super tampon and she has changed it 7 times today, has been bleeding since last Friday.  Has not taken any medication for the pain.  Was in the office today and was told she had a ruptured ectopic pregnancy and needed to be seen here.  LMP: 08/07/2023 Pain score: 7/10 Vitals:   09/19/23 2113  BP: 128/80  Pulse: 75  Resp: 17  Temp: 98.9 F (37.2 C)  SpO2: 98%

## 2023-09-19 NOTE — H&P (Signed)
 R/o ectopic      S Ms. SALEM MASTROGIOVANNI is a 33 y.o. [phone] pregnant female at Unknown who presents to MAU today as sent from clinic for r/o ectopic. Spoke with covering physician Dr. Richardson Dopp and asked for w/u of ectopic.     Called Dr. Sallye Ober and got sign out: LMP 1/8 Prior hx of L sided ectopic now s/p L salpingectomy  Hx of STIs Positive Upreg in clinic Pt states bleeding started 09/13/23 Pt states initially relief with ibuprofen however no longer  No solid PO intake today, 30cc of gingerale b/t 1730 -2130 TTP RLQ in clinic, no rebound or guarding   [place] with R adnexal mass measuring 5x4x3cm, thin uterine strip, nrl b/l ovaries, moderate free fluid in pelvis    Receives care at [facility]. Prenatal records reviewed.   Pertinent items noted in HPI and remainder of comprehensive ROS otherwise negative.    O There were no vitals taken for this visit. Physical Exam Vitals and nursing note reviewed.  Constitutional:      General: She is not in acute distress.    Appearance: She is well-developed. She is not ill-appearing.  HENT:     Head: Normocephalic and atraumatic.     Mouth/Throat:     Mouth: Mucous membranes are moist.     Pharynx: Oropharynx is clear.  Eyes:     Extraocular Movements: Extraocular movements intact.  Cardiovascular:     Rate and Rhythm: Normal rate.  Pulmonary:     Effort: Pulmonary effort is normal. No respiratory distress.  Abdominal:     General: Abdomen is flat. There is no distension.     Palpations: Abdomen is soft.     Tenderness: There is abdominal tenderness in the right lower quadrant. There is rebound. There is no guarding.  Skin:    General: Skin is warm and dry.  Neurological:     Mental Status: She is alert and oriented to person, place, and time.     Motor: No weakness.  Psychiatric:        Mood and Affect: Mood normal.        Behavior: Behavior normal.        MDM: low  MAU Course:   Called Dr. Richardson Dopp (covering attending) and gave  sign out regarding concern for rebound tenderness in RLQ and verbal sign out of US findings per primary OP provider Dr. Sallye Ober.  She agrees with assuming care for plans to go to OR.    AP #R sided ectopic pregnancy - admit to Dr. Dawayne Patricia service with plans for eventual surgery  Patient is seen and examined. History is reviewed Findings consistent with ruptured ectopic pregnancy. Recommend laparoscopic removal of ectopic pregnancy possible unilateral salpingostomy possible unilateral salpingectomy. R/b/a of surgery were discussed with the patient including but not limited to infection, bleeding damage to bowel, bladder , ureters and surrounding organs with the need for further surgery. Patient voiced understanding and desires to proceed.

## 2023-09-19 NOTE — Anesthesia Procedure Notes (Signed)
 Procedure Name: Intubation Date/Time: 09/19/2023 11:16 PM  Performed by: Tressia Miners, CRNAPre-anesthesia Checklist: Patient identified, Emergency Drugs available, Suction available, Patient being monitored and Timeout performed Patient Re-evaluated:Patient Re-evaluated prior to induction Oxygen Delivery Method: Circle system utilized Preoxygenation: Pre-oxygenation with 100% oxygen Induction Type: IV induction, Rapid sequence and Cricoid Pressure applied Laryngoscope Size: Mac and 3 Grade View: Grade I Tube type: Oral Tube size: 7.0 mm Number of attempts: 1 Airway Equipment and Method: Stylet Placement Confirmation: ETT inserted through vocal cords under direct vision, positive ETCO2 and breath sounds checked- equal and bilateral Secured at: 23 cm Tube secured with: Tape Dental Injury: Teeth and Oropharynx as per pre-operative assessment  Comments: Smooth IV Induction. Eyes taped. RSI Performed. DL x 1 with grade 1 view. Atraumatically placed, teeth and lip remain intact as pre-op. Secured with tape. Bilateral breath sounds +/=, EtCO2 +, Adequate TV, VSS.

## 2023-09-20 ENCOUNTER — Encounter (HOSPITAL_COMMUNITY): Payer: Self-pay | Admitting: Obstetrics and Gynecology

## 2023-09-20 DIAGNOSIS — O00101 Right tubal pregnancy without intrauterine pregnancy: Secondary | ICD-10-CM | POA: Diagnosis not present

## 2023-09-20 MED ORDER — DEXAMETHASONE SODIUM PHOSPHATE 10 MG/ML IJ SOLN
INTRAMUSCULAR | Status: AC
  Filled 2023-09-20: qty 1

## 2023-09-20 MED ORDER — SUGAMMADEX SODIUM 200 MG/2ML IV SOLN
INTRAVENOUS | Status: AC
  Filled 2023-09-20: qty 2

## 2023-09-20 MED ORDER — ACETAMINOPHEN 10 MG/ML IV SOLN
INTRAVENOUS | Status: DC | PRN
  Administered 2023-09-20: 1000 mg via INTRAVENOUS

## 2023-09-20 MED ORDER — DEXMEDETOMIDINE HCL IN NACL 80 MCG/20ML IV SOLN
INTRAVENOUS | Status: AC
  Filled 2023-09-20: qty 20

## 2023-09-20 MED ORDER — ONDANSETRON HCL 4 MG/2ML IJ SOLN
INTRAMUSCULAR | Status: DC | PRN
  Administered 2023-09-20: 4 mg via INTRAVENOUS

## 2023-09-20 MED ORDER — SUCCINYLCHOLINE CHLORIDE 200 MG/10ML IV SOSY
PREFILLED_SYRINGE | INTRAVENOUS | Status: AC
  Filled 2023-09-20: qty 10

## 2023-09-20 MED ORDER — ROCURONIUM BROMIDE 10 MG/ML (PF) SYRINGE
PREFILLED_SYRINGE | INTRAVENOUS | Status: AC
  Filled 2023-09-20: qty 10

## 2023-09-20 MED ORDER — IBUPROFEN 800 MG PO TABS
800.0000 mg | ORAL_TABLET | Freq: Three times a day (TID) | ORAL | 0 refills | Status: AC | PRN
Start: 2023-09-20 — End: ?

## 2023-09-20 MED ORDER — DEXMEDETOMIDINE HCL IN NACL 80 MCG/20ML IV SOLN
INTRAVENOUS | Status: DC | PRN
Start: 2023-09-20 — End: 2023-09-20
  Administered 2023-09-20: 8 ug via INTRAVENOUS

## 2023-09-20 MED ORDER — LIDOCAINE 2% (20 MG/ML) 5 ML SYRINGE
INTRAMUSCULAR | Status: AC
  Filled 2023-09-20: qty 5

## 2023-09-20 MED ORDER — HYDROMORPHONE HCL 1 MG/ML IJ SOLN
INTRAMUSCULAR | Status: AC
  Filled 2023-09-20: qty 1

## 2023-09-20 MED ORDER — SURGIFLO WITH THROMBIN (HEMOSTATIC MATRIX KIT) OPTIME
TOPICAL | Status: DC | PRN
  Administered 2023-09-20: 1 via TOPICAL

## 2023-09-20 MED ORDER — PHENYLEPHRINE 80 MCG/ML (10ML) SYRINGE FOR IV PUSH (FOR BLOOD PRESSURE SUPPORT)
PREFILLED_SYRINGE | INTRAVENOUS | Status: AC
  Filled 2023-09-20: qty 10

## 2023-09-20 MED ORDER — ALBUMIN HUMAN 5 % IV SOLN: INTRAVENOUS | Status: DC | PRN

## 2023-09-20 MED ORDER — ONDANSETRON HCL 4 MG/2ML IJ SOLN
INTRAMUSCULAR | Status: AC
  Filled 2023-09-20: qty 2

## 2023-09-20 MED ORDER — OXYCODONE HCL 5 MG PO TABS: 5.0000 mg | ORAL_TABLET | Freq: Four times a day (QID) | ORAL | 0 refills | Status: AC | PRN

## 2023-09-20 MED ORDER — KETOROLAC TROMETHAMINE 30 MG/ML IJ SOLN
INTRAMUSCULAR | Status: AC
  Filled 2023-09-20: qty 1

## 2023-09-20 MED ORDER — SUGAMMADEX SODIUM 200 MG/2ML IV SOLN
INTRAVENOUS | Status: DC | PRN
  Administered 2023-09-20: 225 mg via INTRAVENOUS

## 2023-09-20 MED ORDER — KETOROLAC TROMETHAMINE 30 MG/ML IJ SOLN
INTRAMUSCULAR | Status: DC | PRN
  Administered 2023-09-20: 30 mg via INTRAVENOUS

## 2023-09-20 NOTE — Anesthesia Postprocedure Evaluation (Signed)
 Anesthesia Post Note  Patient: Kristin Lyons  Procedure(s) Performed: DIAGNOSTIC LAPAROSCOPY WITH REMOVAL OF ECTOPIC PREGNANCY     Patient location during evaluation: PACU Anesthesia Type: General Level of consciousness: awake and alert Pain management: pain level controlled Vital Signs Assessment: post-procedure vital signs reviewed and stable Respiratory status: spontaneous breathing, nonlabored ventilation and respiratory function stable Cardiovascular status: blood pressure returned to baseline and stable Postop Assessment: no apparent nausea or vomiting Anesthetic complications: no   No notable events documented.  Last Vitals:  Vitals:   09/20/23 0200 09/20/23 0215  BP: 125/65 124/78  Pulse: 77 85  Resp: 18 18  Temp:  36.7 C  SpO2: 99% 100%    Last Pain:  Vitals:   09/20/23 0200  TempSrc:   PainSc: 4                  Hason Ofarrell,W. EDMOND

## 2023-09-20 NOTE — Op Note (Signed)
 09/19/2023  1:30 AM  PATIENT:  Kristin Lyons  33 y.o. female  PRE-OPERATIVE DIAGNOSIS:  ECTOPIC PREGNANCY  POST-OPERATIVE DIAGNOSIS:  ectopic pregnancy  PROCEDURE:  Procedure(s): DIAGNOSTIC LAPAROSCOPY WITH REMOVAL OF ECTOPIC PREGNANCY (N/A)  SURGEON:  Surgeons and Role:    Gerald Leitz, MD - Primary  PHYSICIAN ASSISTANT:   ASSISTANTS: none   ANESTHESIA:   general  EBL:  150 mL   BLOOD ADMINISTERED:none  DRAINS: Urinary Catheter (Foley)   LOCAL MEDICATIONS USED:  MARCAINE     SPECIMEN:  Source of Specimen:  ectopic tissue  DISPOSITION OF SPECIMEN:  PATHOLOGY  COUNTS:  YES  TOURNIQUET:  * No tourniquets in log *  DICTATION: .Note written in EPIC  PLAN OF CARE: Discharge to home after PACU  PATIENT DISPOSITION:  PACU - hemodynamically stable.   Delay start of Pharmacological VTE agent (>24hrs) due to surgical blood loss or risk of bleeding: not applicable  Findings: normal external genitalia vaginal mucosa and cervix. Adhesions of the colon to the anterior abdominal wall and fundus of the uterus. Ectopic tissue seen at the fundus of the uterus. Defect in the left cornua noted. The right fallopian tube appeared tortuous however no ectopic pregnancy was noted. The right ovary appeared normal.   Procedure: the patient was taken to the operating room#8 [facility] hospital she was  placed under general anesthesia. Time Out was performed.  She was  Prepped and draped in the normal sterile fashion. A foley catheter was placed. A uterine manipulator was placed. Attention was turned to the abdomen where the umbilicus was injected with 10 cc of marcaine. A 10 mm trocar was placed under direct visualization. Pneumoperitoneum was achieved with C02 gas... A 5 mm trocar was placed in the right and left lower quadrants. Each trocar site was injected with 10 cc of marcaine prior to trocar placement. The adhesions of the colon to anterior abdomen wall and uterus were removed with  blunt dissection. Ectopic appearing tissue was noted at the fundus and left cornua of the uterus. This was detached.  An endo catch bag was placed through the 10 mm umbilical port. The specimen was placed in the bag and removed through the umbilical incision. Pneumoperitoneum was reestablished. Pt was noted to have bleeding from the left cornua this was controlled with the ligasure.  The pelvis was irrigated. Arista was placed along the left cornua for additional hemostasis. Excellent hemostasis was noted. All trocars were removed under direct visualization . The pneumoperitoneum was released.  The fascia of the umbilical incision was re approximated with 0 vicryl. The skin incisions were closed with 4-0 vicryl and derma bond.  the patient was taken to the recovery room awake and in stable condition.  Sponge lap and needle counts were correct times 2.

## 2023-09-20 NOTE — Transfer of Care (Addendum)
 Immediate Anesthesia Transfer of Care Note  Patient: Kristin Lyons  Procedure(s) Performed: DIAGNOSTIC LAPAROSCOPY WITH REMOVAL OF ECTOPIC PREGNANCY  Patient Location: PACU  Anesthesia Type:General  Level of Consciousness: drowsy  Airway & Oxygen Therapy: Patient Spontanous Breathing and Patient connected to face mask oxygen  Post-op Assessment: Report given to RN and Post -op Vital signs reviewed and stable  Post vital signs: Reviewed and stable  Last Vitals:  Vitals Value Taken Time  BP 123/63 09/20/23 0140  Temp 98.5 F 09/20/23 0143  Pulse 78 09/20/23 0143  Resp 19 09/20/23 0143  SpO2 98 % 09/20/23 0143  Vitals shown include unfiled device data.  Last Pain:  Vitals:   09/19/23 2114  TempSrc:   PainSc: 7          Complications: No notable events documented.

## 2023-09-23 LAB — SURGICAL PATHOLOGY
